# Patient Record
Sex: Female | Born: 1975 | Race: Black or African American | Hispanic: No | Marital: Married | Smoking: Never smoker
Health system: Southern US, Community
[De-identification: ages and names within clinical notes are randomized; demographics above are authoritative.]

## PROBLEM LIST (undated history)

## (undated) DIAGNOSIS — Z01419 Encounter for gynecological examination (general) (routine) without abnormal findings: Secondary | ICD-10-CM

## (undated) DIAGNOSIS — E559 Vitamin D deficiency, unspecified: Secondary | ICD-10-CM

## (undated) DIAGNOSIS — T782XXA Anaphylactic shock, unspecified, initial encounter: Secondary | ICD-10-CM

## (undated) DIAGNOSIS — Z803 Family history of malignant neoplasm of breast: Secondary | ICD-10-CM

## (undated) DIAGNOSIS — Z800 Family history of malignant neoplasm of digestive organs: Secondary | ICD-10-CM

## (undated) DIAGNOSIS — Z975 Presence of (intrauterine) contraceptive device: Secondary | ICD-10-CM

## (undated) DIAGNOSIS — J302 Other seasonal allergic rhinitis: Secondary | ICD-10-CM

## (undated) DIAGNOSIS — I10 Essential (primary) hypertension: Secondary | ICD-10-CM

## (undated) DIAGNOSIS — K5900 Constipation, unspecified: Secondary | ICD-10-CM

## (undated) DIAGNOSIS — Z9289 Personal history of other medical treatment: Secondary | ICD-10-CM

## (undated) DIAGNOSIS — G43909 Migraine, unspecified, not intractable, without status migrainosus: Secondary | ICD-10-CM

## (undated) DIAGNOSIS — Z6711 Type A blood, Rh negative: Secondary | ICD-10-CM

## (undated) HISTORY — DX: Family history of malignant neoplasm of breast: Z80.3

## (undated) HISTORY — DX: Type A blood, Rh negative: Z67.11

## (undated) HISTORY — DX: Vitamin D deficiency, unspecified: E55.9

## (undated) HISTORY — DX: Essential (primary) hypertension: I10

## (undated) HISTORY — DX: Migraine, unspecified, not intractable, without status migrainosus: G43.909

## (undated) HISTORY — DX: Personal history of other medical treatment: Z92.89

## (undated) HISTORY — DX: Constipation, unspecified: K59.00

## (undated) HISTORY — DX: Family history of malignant neoplasm of digestive organs: Z80.0

## (undated) HISTORY — DX: Other seasonal allergic rhinitis: J30.2

## (undated) HISTORY — DX: Anaphylactic shock, unspecified, initial encounter: T78.2XXA

## (undated) HISTORY — DX: Presence of (intrauterine) contraceptive device: Z97.5

## (undated) HISTORY — DX: Encounter for gynecological examination (general) (routine) without abnormal findings: Z01.419

## (undated) HISTORY — PX: TOE SURGERY: SHX1073

---

## 1998-04-14 ENCOUNTER — Encounter: Admission: RE | Admit: 1998-04-14 | Discharge: 1998-04-14 | Payer: Self-pay | Admitting: Family Medicine

## 1998-05-08 ENCOUNTER — Encounter: Admission: RE | Admit: 1998-05-08 | Discharge: 1998-05-08 | Payer: Self-pay | Admitting: Family Medicine

## 1998-05-21 ENCOUNTER — Encounter: Admission: RE | Admit: 1998-05-21 | Discharge: 1998-05-21 | Payer: Self-pay | Admitting: Family Medicine

## 1998-08-25 ENCOUNTER — Encounter: Admission: RE | Admit: 1998-08-25 | Discharge: 1998-08-25 | Payer: Self-pay | Admitting: Family Medicine

## 1998-08-27 ENCOUNTER — Encounter: Admission: RE | Admit: 1998-08-27 | Discharge: 1998-08-27 | Payer: Self-pay | Admitting: Family Medicine

## 1998-09-08 ENCOUNTER — Encounter: Admission: RE | Admit: 1998-09-08 | Discharge: 1998-09-08 | Payer: Self-pay | Admitting: Family Medicine

## 1998-09-15 ENCOUNTER — Encounter: Admission: RE | Admit: 1998-09-15 | Discharge: 1998-09-15 | Payer: Self-pay | Admitting: Family Medicine

## 1998-10-16 ENCOUNTER — Encounter: Admission: RE | Admit: 1998-10-16 | Discharge: 1998-10-16 | Payer: Self-pay | Admitting: Family Medicine

## 1998-11-17 ENCOUNTER — Encounter: Admission: RE | Admit: 1998-11-17 | Discharge: 1998-11-17 | Payer: Self-pay | Admitting: Family Medicine

## 1998-12-12 ENCOUNTER — Encounter: Admission: RE | Admit: 1998-12-12 | Discharge: 1998-12-12 | Payer: Self-pay | Admitting: Sports Medicine

## 1999-01-14 ENCOUNTER — Encounter: Admission: RE | Admit: 1999-01-14 | Discharge: 1999-01-14 | Payer: Self-pay | Admitting: Family Medicine

## 1999-01-28 ENCOUNTER — Encounter: Admission: RE | Admit: 1999-01-28 | Discharge: 1999-01-28 | Payer: Self-pay | Admitting: Family Medicine

## 1999-06-29 ENCOUNTER — Encounter: Admission: RE | Admit: 1999-06-29 | Discharge: 1999-06-29 | Payer: Self-pay | Admitting: Family Medicine

## 1999-07-06 ENCOUNTER — Encounter: Admission: RE | Admit: 1999-07-06 | Discharge: 1999-10-04 | Payer: Self-pay | Admitting: *Deleted

## 1999-11-10 ENCOUNTER — Encounter: Payer: Self-pay | Admitting: Emergency Medicine

## 1999-11-10 ENCOUNTER — Emergency Department (HOSPITAL_COMMUNITY): Admission: EM | Admit: 1999-11-10 | Discharge: 1999-11-10 | Payer: Self-pay | Admitting: Emergency Medicine

## 1999-11-13 ENCOUNTER — Ambulatory Visit (HOSPITAL_COMMUNITY): Admission: RE | Admit: 1999-11-13 | Discharge: 1999-11-13 | Payer: Self-pay | Admitting: Family Medicine

## 1999-11-13 ENCOUNTER — Encounter: Admission: RE | Admit: 1999-11-13 | Discharge: 1999-11-13 | Payer: Self-pay | Admitting: Family Medicine

## 2000-01-20 ENCOUNTER — Encounter: Admission: RE | Admit: 2000-01-20 | Discharge: 2000-01-20 | Payer: Self-pay | Admitting: Family Medicine

## 2000-05-09 ENCOUNTER — Encounter: Admission: RE | Admit: 2000-05-09 | Discharge: 2000-05-09 | Payer: Self-pay | Admitting: Family Medicine

## 2000-07-26 ENCOUNTER — Encounter: Admission: RE | Admit: 2000-07-26 | Discharge: 2000-07-26 | Payer: Self-pay | Admitting: Family Medicine

## 2000-12-29 ENCOUNTER — Ambulatory Visit (HOSPITAL_COMMUNITY): Admission: RE | Admit: 2000-12-29 | Discharge: 2000-12-29 | Payer: Self-pay | Admitting: Orthopedic Surgery

## 2000-12-30 ENCOUNTER — Encounter: Admission: RE | Admit: 2000-12-30 | Discharge: 2000-12-30 | Payer: Self-pay | Admitting: Family Medicine

## 2001-01-04 ENCOUNTER — Encounter: Payer: Self-pay | Admitting: Emergency Medicine

## 2001-01-04 ENCOUNTER — Emergency Department (HOSPITAL_COMMUNITY): Admission: EM | Admit: 2001-01-04 | Discharge: 2001-01-04 | Payer: Self-pay | Admitting: Emergency Medicine

## 2001-01-05 ENCOUNTER — Encounter: Payer: Self-pay | Admitting: Emergency Medicine

## 2001-01-09 ENCOUNTER — Encounter: Payer: Self-pay | Admitting: Sports Medicine

## 2001-01-09 ENCOUNTER — Encounter: Admission: RE | Admit: 2001-01-09 | Discharge: 2001-01-09 | Payer: Self-pay | Admitting: Family Medicine

## 2001-01-09 ENCOUNTER — Encounter: Admission: RE | Admit: 2001-01-09 | Discharge: 2001-01-09 | Payer: Self-pay | Admitting: Sports Medicine

## 2001-01-13 ENCOUNTER — Encounter: Admission: RE | Admit: 2001-01-13 | Discharge: 2001-01-13 | Payer: Self-pay | Admitting: Family Medicine

## 2001-01-16 ENCOUNTER — Encounter: Admission: RE | Admit: 2001-01-16 | Discharge: 2001-03-13 | Payer: Self-pay | Admitting: Sports Medicine

## 2001-01-20 ENCOUNTER — Encounter: Admission: RE | Admit: 2001-01-20 | Discharge: 2001-01-20 | Payer: Self-pay | Admitting: Family Medicine

## 2001-02-06 ENCOUNTER — Encounter: Admission: RE | Admit: 2001-02-06 | Discharge: 2001-02-06 | Payer: Self-pay | Admitting: Family Medicine

## 2001-04-21 ENCOUNTER — Encounter: Admission: RE | Admit: 2001-04-21 | Discharge: 2001-04-21 | Payer: Self-pay | Admitting: Family Medicine

## 2001-06-16 ENCOUNTER — Encounter: Admission: RE | Admit: 2001-06-16 | Discharge: 2001-06-16 | Payer: Self-pay | Admitting: Family Medicine

## 2001-07-19 ENCOUNTER — Encounter: Admission: RE | Admit: 2001-07-19 | Discharge: 2001-07-19 | Payer: Self-pay | Admitting: Family Medicine

## 2001-08-31 ENCOUNTER — Encounter: Payer: Self-pay | Admitting: *Deleted

## 2001-08-31 ENCOUNTER — Encounter: Admission: RE | Admit: 2001-08-31 | Discharge: 2001-08-31 | Payer: Self-pay | Admitting: Family Medicine

## 2001-08-31 ENCOUNTER — Encounter: Admission: RE | Admit: 2001-08-31 | Discharge: 2001-08-31 | Payer: Self-pay | Admitting: *Deleted

## 2001-10-03 ENCOUNTER — Ambulatory Visit (HOSPITAL_COMMUNITY): Admission: RE | Admit: 2001-10-03 | Discharge: 2001-10-03 | Payer: Self-pay | Admitting: Obstetrics and Gynecology

## 2001-10-03 ENCOUNTER — Encounter: Payer: Self-pay | Admitting: Obstetrics and Gynecology

## 2001-10-05 ENCOUNTER — Inpatient Hospital Stay (HOSPITAL_COMMUNITY): Admission: AD | Admit: 2001-10-05 | Discharge: 2001-10-05 | Payer: Self-pay | Admitting: Obstetrics and Gynecology

## 2002-03-01 ENCOUNTER — Inpatient Hospital Stay (HOSPITAL_COMMUNITY): Admission: AD | Admit: 2002-03-01 | Discharge: 2002-03-01 | Payer: Self-pay | Admitting: Obstetrics and Gynecology

## 2002-03-03 ENCOUNTER — Inpatient Hospital Stay (HOSPITAL_COMMUNITY): Admission: AD | Admit: 2002-03-03 | Discharge: 2002-03-03 | Payer: Self-pay | Admitting: Obstetrics and Gynecology

## 2002-04-15 ENCOUNTER — Inpatient Hospital Stay (HOSPITAL_COMMUNITY): Admission: AD | Admit: 2002-04-15 | Discharge: 2002-04-15 | Payer: Self-pay | Admitting: Obstetrics and Gynecology

## 2002-05-15 ENCOUNTER — Inpatient Hospital Stay (HOSPITAL_COMMUNITY): Admission: AD | Admit: 2002-05-15 | Discharge: 2002-05-15 | Payer: Self-pay | Admitting: Obstetrics and Gynecology

## 2002-05-20 ENCOUNTER — Inpatient Hospital Stay (HOSPITAL_COMMUNITY): Admission: AD | Admit: 2002-05-20 | Discharge: 2002-05-20 | Payer: Self-pay | Admitting: Obstetrics and Gynecology

## 2002-05-24 ENCOUNTER — Inpatient Hospital Stay (HOSPITAL_COMMUNITY): Admission: AD | Admit: 2002-05-24 | Discharge: 2002-05-26 | Payer: Self-pay | Admitting: Obstetrics and Gynecology

## 2002-12-27 ENCOUNTER — Other Ambulatory Visit: Admission: RE | Admit: 2002-12-27 | Discharge: 2002-12-27 | Payer: Self-pay | Admitting: *Deleted

## 2002-12-27 ENCOUNTER — Other Ambulatory Visit: Admission: RE | Admit: 2002-12-27 | Discharge: 2002-12-27 | Payer: Self-pay | Admitting: Obstetrics and Gynecology

## 2004-09-01 ENCOUNTER — Emergency Department (HOSPITAL_COMMUNITY): Admission: EM | Admit: 2004-09-01 | Discharge: 2004-09-02 | Payer: Self-pay | Admitting: Emergency Medicine

## 2005-07-07 ENCOUNTER — Other Ambulatory Visit: Admission: RE | Admit: 2005-07-07 | Discharge: 2005-07-07 | Payer: Self-pay | Admitting: Obstetrics and Gynecology

## 2006-02-06 ENCOUNTER — Emergency Department (HOSPITAL_COMMUNITY): Admission: EM | Admit: 2006-02-06 | Discharge: 2006-02-06 | Payer: Self-pay | Admitting: Emergency Medicine

## 2007-09-30 ENCOUNTER — Emergency Department (HOSPITAL_COMMUNITY): Admission: EM | Admit: 2007-09-30 | Discharge: 2007-09-30 | Payer: Self-pay | Admitting: Emergency Medicine

## 2007-10-03 ENCOUNTER — Other Ambulatory Visit: Admission: RE | Admit: 2007-10-03 | Discharge: 2007-10-03 | Payer: Self-pay | Admitting: *Deleted

## 2010-11-05 DIAGNOSIS — Z975 Presence of (intrauterine) contraceptive device: Secondary | ICD-10-CM

## 2010-11-05 HISTORY — DX: Presence of (intrauterine) contraceptive device: Z97.5

## 2011-01-28 ENCOUNTER — Other Ambulatory Visit: Payer: Self-pay | Admitting: Family Medicine

## 2011-01-28 DIAGNOSIS — N6459 Other signs and symptoms in breast: Secondary | ICD-10-CM

## 2011-02-03 ENCOUNTER — Other Ambulatory Visit: Payer: Self-pay | Admitting: Family Medicine

## 2011-02-03 ENCOUNTER — Ambulatory Visit
Admission: RE | Admit: 2011-02-03 | Discharge: 2011-02-03 | Disposition: A | Discharge status: Home / Self Care | Payer: BC Managed Care – PPO | Source: Ambulatory Visit | Attending: Family Medicine | Admitting: Family Medicine

## 2011-02-03 DIAGNOSIS — N6459 Other signs and symptoms in breast: Secondary | ICD-10-CM

## 2011-04-23 NOTE — H&P (Signed)
[facility]  Patient:    Christine Gardner, Christine Gardner Visit Number: [id number] MRN: [id number]          Service Type: OBS Location: 910B [id number] [id number] Attending Physician:  Esmeralda Arthur Dictated by:   Silverio Lay, M.D. Admit Date:  05/24/2002                           History and Physical  REASON FOR ADMISSION:         Intrauterine pregnancy at 39 weeks and one day in active labor.  HISTORY OF PRESENT ILLNESS:   This is a 35 year old married African American woman, gravida 4, para 2, with an estimated delivery date of May 30, 2002, being admitted at 39 weeks and one day for regular uterine contractions since 10 a.m. this morning.  Upon her arrival at maternity admission, she was 6-7 cm, 90% effaced, bulging bag of water.  She reports good fetal activity. Denies any pregnancy-induced hypertension symptoms.  Denies any pregnancy-induced hypertension symptoms.  Denies any leaking of water and denies any bleeding.  Her last visit at [facility] was May 23, 2002, where membranes were stripped.  Prenatal course is remarkable for blood type A-negative, sickle cell negative, RPR nonreactive, rubella immune, HBsAg negative.  HIV nonreactive.  Pap smear within normal limits.  Gonorrhea negative.  Chlamydia negative.  A 28 week glucose tolerance test normal.  A 28 week RhoGAM received.  A 35 week group B strep positive.  A 20 week ultrasound with normal anatomy survey.  She also demonstrates a variable elevation of her hypertension.  At no time, evidence of pregnancy-induced hypertension.  ALLERGIES:                    VICODIN.  PAST MEDICAL HISTORY:         In 1990, early termination of pregnancy with no problem.  In June of 1995, spontaneous vaginal delivery of a female infant at 40 weeks plus weighing 7 pounds.  In April 1998, spontaneous vaginal delivery of a female infant at 40 weeks weighing 7 pounds and 1 ounce.  FAMILY HISTORY:               Maternal  mother and maternal grandmother with hypertension.  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  PHYSICAL EXAMINATION:  VITAL SIGNS:                  Normal.  HEENT:                        Negative.  LUNGS:                        Clear.  HEART:                        Normal.  ABDOMEN:                      Gravid nontender, size adequate for dates.  VAGINAL EXAMINATION:          Nine centimeters, completely effaced, bulging bag of membranes, vertex -1.  EXTREMITIES:                  Negative.  LABORATORY DATA:  Fetal heart rate tracings are reactive.  ASSESSMENT:                   Intrauterine pregnancy at 39 weeks and one day                               in active labor.  PLAN:                         The patient is admitted to labor and delivery with routine MD orders.  We will proceed with artificial rupture of membranes as soon as possible, and spontaneous vaginal delivery is expected. Dictated by:   Silverio Lay, M.D. Attending Physician:  Esmeralda Arthur DD:  05/24/02 TD:  05/24/02 Job: [id number]

## 2011-04-23 NOTE — H&P (Signed)
[facility]  Patient:    Christine Gardner, Christine Gardner Visit Number: [id number] MRN: [id number]          Service Type: OBS Location: 910A 9101 01 Attending Physician:  Esmeralda Arthur Dictated by:   Nigel Bridgeman, C.N.M. Admit Date:  05/24/2002                           History and Physical  No dictation. Dictated by:   Nigel Bridgeman, C.N.M. Attending Physician:  Esmeralda Arthur DD:  05/24/02 TD:  05/24/02 Job: [id number]

## 2011-09-15 LAB — URINALYSIS, ROUTINE W REFLEX MICROSCOPIC
Bilirubin Urine: NEGATIVE
Glucose, UA: NEGATIVE
Ketones, ur: NEGATIVE
Protein, ur: 100 — AB

## 2011-09-15 LAB — PREGNANCY, URINE: Preg Test, Ur: NEGATIVE

## 2011-09-15 LAB — URINE MICROSCOPIC-ADD ON

## 2011-11-11 ENCOUNTER — Other Ambulatory Visit: Payer: Self-pay | Admitting: Orthopedic Surgery

## 2011-11-11 DIAGNOSIS — R0789 Other chest pain: Secondary | ICD-10-CM

## 2011-11-13 ENCOUNTER — Ambulatory Visit
Admission: RE | Admit: 2011-11-13 | Discharge: 2011-11-13 | Disposition: A | Discharge status: Home / Self Care | Payer: BC Managed Care – PPO | Source: Ambulatory Visit | Attending: Orthopedic Surgery | Admitting: Orthopedic Surgery

## 2011-11-13 DIAGNOSIS — R0789 Other chest pain: Secondary | ICD-10-CM

## 2011-11-13 MED ORDER — GADOBENATE DIMEGLUMINE 529 MG/ML IV SOLN
14.0000 mL | Freq: Once | INTRAVENOUS | Status: AC | PRN
  Administered 2011-11-13: 14 mL via INTRAVENOUS

## 2011-12-07 HISTORY — PX: COLPOSCOPY: SHX161

## 2012-03-22 ENCOUNTER — Other Ambulatory Visit (HOSPITAL_COMMUNITY)
Admission: RE | Admit: 2012-03-22 | Discharge: 2012-03-22 | Disposition: A | Discharge status: Home / Self Care | Payer: BC Managed Care – PPO | Source: Ambulatory Visit | Attending: Internal Medicine | Admitting: Internal Medicine

## 2012-03-22 ENCOUNTER — Other Ambulatory Visit: Payer: Self-pay | Admitting: Family Medicine

## 2012-03-22 DIAGNOSIS — Z113 Encounter for screening for infections with a predominantly sexual mode of transmission: Secondary | ICD-10-CM | POA: Insufficient documentation

## 2012-03-22 DIAGNOSIS — R87810 Cervical high risk human papillomavirus (HPV) DNA test positive: Secondary | ICD-10-CM | POA: Insufficient documentation

## 2012-03-22 DIAGNOSIS — Z01419 Encounter for gynecological examination (general) (routine) without abnormal findings: Secondary | ICD-10-CM | POA: Insufficient documentation

## 2012-05-03 ENCOUNTER — Encounter: Payer: Self-pay | Admitting: Obstetrics and Gynecology

## 2012-05-04 ENCOUNTER — Encounter: Payer: Self-pay | Admitting: Obstetrics and Gynecology

## 2012-08-18 ENCOUNTER — Encounter: Payer: Self-pay | Admitting: Obstetrics and Gynecology

## 2012-08-29 ENCOUNTER — Encounter: Payer: BC Managed Care – PPO | Admitting: Obstetrics and Gynecology

## 2012-09-06 ENCOUNTER — Encounter: Payer: Self-pay | Admitting: Obstetrics and Gynecology

## 2012-09-19 ENCOUNTER — Ambulatory Visit: Payer: BC Managed Care – PPO

## 2012-09-19 ENCOUNTER — Encounter: Payer: BC Managed Care – PPO | Admitting: Obstetrics and Gynecology

## 2012-09-26 ENCOUNTER — Ambulatory Visit: Payer: BC Managed Care – PPO

## 2012-09-26 ENCOUNTER — Ambulatory Visit (INDEPENDENT_AMBULATORY_CARE_PROVIDER_SITE_OTHER): Payer: BC Managed Care – PPO | Admitting: Obstetrics and Gynecology

## 2012-09-26 ENCOUNTER — Encounter: Payer: Self-pay | Admitting: Obstetrics and Gynecology

## 2012-09-26 VITALS — BP 130/88 | Wt 169.0 lb

## 2012-09-26 DIAGNOSIS — B977 Papillomavirus as the cause of diseases classified elsewhere: Secondary | ICD-10-CM

## 2012-09-26 DIAGNOSIS — IMO0002 Reserved for concepts with insufficient information to code with codable children: Secondary | ICD-10-CM | POA: Insufficient documentation

## 2012-09-26 DIAGNOSIS — R6889 Other general symptoms and signs: Secondary | ICD-10-CM

## 2012-09-26 LAB — POCT URINE PREGNANCY: Preg Test, Ur: NEGATIVE

## 2012-09-26 NOTE — Patient Instructions (Signed)
Colposcopy Care After Colposcopy is a procedure in which a special tool is used to magnify the surface of the cervix. A tissue sample (biopsy) may also be taken. This sample will be looked at for cervical cancer or other problems. After the test:  You may have some cramping.  Lie down for a few minutes if you feel lightheaded.   You may have some bleeding which should stop in a few days. HOME CARE  Do not have sex or use tampons for 2 to 3 days or as told.  Only take medicine as told by your doctor.  Continue to take your birth control pills as usual. Finding out the results of your test Ask when your test results will be ready. Make sure you get your test results. GET HELP RIGHT AWAY IF:  You are bleeding a lot or are passing blood clots.  You develop a fever of 102 F (38.9 C) or higher.  You have abnormal vaginal discharge.  You have cramps that do not go away with medicine.  You feel lightheaded, dizzy, or pass out (faint). MAKE SURE YOU:   Understand these instructions.  Will watch your condition.  Will get help right away if you are not doing well or get worse. Document Released: 05/10/2008 Document Revised: 02/14/2012 Document Reviewed: 05/10/2008 ExitCare Patient Information 2013 [employer]. HPV Test The HPV (human papillomavirus) test is used to screen for high-risk types with HPV infection. HPV is a group of about 100 related viruses, of which 40 types are genital viruses. Most HPV viruses cause infections that usually resolve without treatment within 2 years. Some HPV infections can cause skin and genital warts (condylomata). HPV types 16, 18, 31 and 45 are considered high-risk types of HPV. High-risk types of HPV do not usually cause visible warts, but if untreated, may lead to cancers of the outlet of the womb (cervix) or anus. An HPV test identifies the DNA (genetic) strands of the HPV infection. Because the test identifies the DNA strands, the test is also  referred to as the HPV DNA test. Although HPV is found in both males and females, the HPV test is only used to screen for cervical cancer in females. This test is recommended for females:  With an abnormal Pap test.  After treatment of an abnormal Pap test.  Aged 30 and older.  After treatment of a high-risk HPV infection. The HPV test may be done at the same time as a Pap test in females over the age of 30. Both the HPV and Pap test require a sample of cells from the cervix. PREPARATION FOR TEST  You may be asked to avoid douching, tampons, or vaginal medicines for 48 hours before the HPV test. You will be asked to urinate before the test. For the HPV test, you will need to lie on an exam table with your feet in stirrups. A spatula will be inserted into the vagina. The spatula will be used to swab the cervix for a cell and mucus sample. The sample will be further evaluated in a lab under a microscope. NORMAL FINDINGS  Normal: High-risk HPV is not found.  Ranges for normal findings may vary among different laboratories and hospitals. You should always check with your doctor after having lab work or other tests done to discuss the meaning of your test results and whether your values are considered within normal limits. MEANING OF TEST An abnormal HPV test means that high-risk HPV is found. Your caregiver may   recommend further testing. Your caregiver will go over the test results with you. He or she will and discuss the importance and meaning of your results, as well as treatment options and the need for additional tests, if necessary. OBTAINING THE RESULTS  It is your responsibility to obtain your test results. Ask the lab or department performing the test when and how you will get your results. Document Released: 12/17/2004 Document Revised: 02/14/2012 Document Reviewed: 09/01/2005 ExitCare Patient Information 2013 [employer].  

## 2012-09-26 NOTE — Progress Notes (Signed)
Previous Pap Smear: 03/22/12  LSIL CIN-1/HPV Previous Colposcopy: n/a Referred From: [facility] LMP: 09/15/12 Contraception: Mirena G,P: 4, 3 HRHPV  Also positive colpo done Adequate bx at 1 oclock where AW changes are seen.  ECC done Physical Examination: Pelvic - normal external genitalia, vulva, vagina, , uterus and adnexa Will call pt with results

## 2012-09-28 LAB — PATHOLOGY

## 2012-09-29 ENCOUNTER — Telehealth: Payer: Self-pay

## 2012-09-29 NOTE — Telephone Encounter (Signed)
Message copied by Rolla Plate on Fri Sep 29, 2012 10:05 AM ------      Message from: Jaymes Graff      Created: Fri Sep 29, 2012  1:19 AM       Please review colpo results with patient and tell her I recommend a pap every six months for the next year.

## 2012-09-29 NOTE — Telephone Encounter (Signed)
Spoke with pt informed info below pt voice understanding

## 2013-05-29 ENCOUNTER — Encounter: Payer: Self-pay | Admitting: Medical

## 2013-05-29 ENCOUNTER — Ambulatory Visit (INDEPENDENT_AMBULATORY_CARE_PROVIDER_SITE_OTHER): Payer: BC Managed Care – PPO | Admitting: Medical

## 2013-05-29 VITALS — BP 100/80 | HR 68 | Temp 98.3°F | Resp 16 | Ht 68.0 in | Wt 174.0 lb

## 2013-05-29 DIAGNOSIS — R5383 Other fatigue: Secondary | ICD-10-CM

## 2013-05-29 DIAGNOSIS — R5381 Other malaise: Secondary | ICD-10-CM

## 2013-05-29 DIAGNOSIS — K029 Dental caries, unspecified: Secondary | ICD-10-CM

## 2013-05-29 DIAGNOSIS — Z23 Encounter for immunization: Secondary | ICD-10-CM

## 2013-05-29 DIAGNOSIS — J392 Other diseases of pharynx: Secondary | ICD-10-CM

## 2013-05-29 DIAGNOSIS — M25519 Pain in unspecified shoulder: Secondary | ICD-10-CM

## 2013-05-29 DIAGNOSIS — M25511 Pain in right shoulder: Secondary | ICD-10-CM

## 2013-05-29 DIAGNOSIS — Z0000 Encounter for general adult medical examination without abnormal findings: Secondary | ICD-10-CM

## 2013-05-29 LAB — LIPID PANEL
Cholesterol: 155 mg/dL (ref 0–200)
VLDL: 12 mg/dL (ref 0–40)

## 2013-05-29 LAB — COMPREHENSIVE METABOLIC PANEL
ALT: 10 U/L (ref 0–35)
AST: 14 U/L (ref 0–37)
Albumin: 4.2 g/dL (ref 3.5–5.2)
Alkaline Phosphatase: 65 U/L (ref 39–117)
Glucose, Bld: 92 mg/dL (ref 70–99)
Potassium: 4.1 mEq/L (ref 3.5–5.3)
Sodium: 135 mEq/L (ref 135–145)
Total Protein: 7.7 g/dL (ref 6.0–8.3)

## 2013-05-29 LAB — CBC WITH DIFFERENTIAL/PLATELET
Basophils Relative: 0 % (ref 0–1)
Eosinophils Absolute: 0.2 10*3/uL (ref 0.0–0.7)
Eosinophils Relative: 2 % (ref 0–5)
MCH: 32 pg (ref 26.0–34.0)
MCHC: 33.7 g/dL (ref 30.0–36.0)
MCV: 95.1 fL (ref 78.0–100.0)
Neutrophils Relative %: 69 % (ref 43–77)
Platelets: 270 10*3/uL (ref 150–400)
RDW: 13.1 % (ref 11.5–15.5)

## 2013-05-29 LAB — POCT URINALYSIS DIPSTICK
Bilirubin, UA: NEGATIVE
Ketones, UA: NEGATIVE
Leukocytes, UA: NEGATIVE
Nitrite, UA: NEGATIVE
Protein, UA: NEGATIVE
pH, UA: 7

## 2013-05-29 LAB — TSH: TSH: 1.265 u[IU]/mL (ref 0.350–4.500)
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
## 2013-06-05 DIAGNOSIS — T782XXA Anaphylactic shock, unspecified, initial encounter: Secondary | ICD-10-CM

## 2013-06-05 HISTORY — DX: Anaphylactic shock, unspecified, initial encounter: T78.2XXA

## 2013-06-13 ENCOUNTER — Emergency Department (HOSPITAL_COMMUNITY)
Admission: EM | Admit: 2013-06-13 | Discharge: 2013-06-13 | Disposition: A | Discharge status: Home / Self Care | Payer: BC Managed Care – PPO | Attending: Emergency Medicine | Admitting: Emergency Medicine

## 2013-06-13 ENCOUNTER — Telehealth: Payer: Self-pay | Admitting: Internal Medicine

## 2013-06-13 ENCOUNTER — Encounter (HOSPITAL_COMMUNITY): Payer: Self-pay | Admitting: *Deleted

## 2013-06-13 DIAGNOSIS — R000 Tachycardia, unspecified: Secondary | ICD-10-CM | POA: Insufficient documentation

## 2013-06-13 DIAGNOSIS — Z8679 Personal history of other diseases of the circulatory system: Secondary | ICD-10-CM | POA: Insufficient documentation

## 2013-06-13 DIAGNOSIS — L509 Urticaria, unspecified: Secondary | ICD-10-CM | POA: Diagnosis present

## 2013-06-13 DIAGNOSIS — Z8639 Personal history of other endocrine, nutritional and metabolic disease: Secondary | ICD-10-CM | POA: Insufficient documentation

## 2013-06-13 DIAGNOSIS — Z8709 Personal history of other diseases of the respiratory system: Secondary | ICD-10-CM | POA: Diagnosis not present

## 2013-06-13 DIAGNOSIS — Z862 Personal history of diseases of the blood and blood-forming organs and certain disorders involving the immune mechanism: Secondary | ICD-10-CM | POA: Insufficient documentation

## 2013-06-13 MED ORDER — DIPHENHYDRAMINE HCL 25 MG PO CAPS
50.0000 mg | ORAL_CAPSULE | Freq: Once | ORAL | Status: AC
  Administered 2013-06-13: 50 mg via ORAL
  Filled 2013-06-13: qty 2

## 2013-06-13 MED ORDER — FAMOTIDINE 20 MG PO TABS
20.0000 mg | ORAL_TABLET | Freq: Once | ORAL | Status: AC
Start: 2013-06-13 — End: 2013-06-13
  Administered 2013-06-13: 20 mg via ORAL
  Filled 2013-06-13: qty 1

## 2013-06-13 MED ORDER — PREDNISONE 20 MG PO TABS
60.0000 mg | ORAL_TABLET | Freq: Once | ORAL | Status: AC
  Administered 2013-06-13: 60 mg via ORAL
  Filled 2013-06-13: qty 3

## 2013-06-13 NOTE — Telephone Encounter (Signed)
Was this patient offered appt for Wednesday?  We had spots to work her in.  Being the only provider here, I wasn't able to get to this message til late in the day, and she apparently ended going to ED.

## 2013-06-13 NOTE — ED Provider Notes (Signed)
3:57 PM MSE exam: pt with diffuse worsening urticaria beginning last night. Pt took benadryl with some relief. Denies new exposures, medications, foods. No SOB, trouble breathing, mouth or lip swelling, lightheadedness/syncope, vomiting/diarrhea.   Exam: Skin: generalized urticara, no airway swelling, no wheezing.   Plan: benadryl, pepcid, prednisone, move to back to monitor for improvement. Pt stable.   Renne Crigler, PA-C 06/13/13 1559

## 2013-06-13 NOTE — Telephone Encounter (Signed)
Pt states that she started breaking out in hives last night at midnight on her legs and it has gone to her chest,face,arms and she was wondering what she can do to help it.?

## 2013-06-13 NOTE — ED Notes (Signed)
Pt has hives to entire body. Reports she woke up at midnight last night itching and noticed the hives beginning on body. pts airway is intact/no airway involvement.

## 2013-06-13 NOTE — ED Provider Notes (Signed)
Medical screening examination/treatment/procedure(s) were performed by non-physician practitioner and as supervising physician I was immediately available for consultation/collaboration.  Sunnie Nielsen, MD 06/13/13 [phone]

## 2013-06-13 NOTE — ED Provider Notes (Signed)
Medical screening examination/treatment/procedure(s) were performed by non-physician practitioner and as supervising physician I was immediately available for consultation/collaboration.  Sunnie Nielsen, MD 06/13/13 [phone]
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
## 2013-06-14 ENCOUNTER — Other Ambulatory Visit: Payer: Self-pay

## 2013-06-14 ENCOUNTER — Encounter: Payer: Self-pay | Admitting: Family Medicine

## 2013-06-14 ENCOUNTER — Ambulatory Visit (INDEPENDENT_AMBULATORY_CARE_PROVIDER_SITE_OTHER): Payer: BC Managed Care – PPO | Admitting: Family Medicine

## 2013-06-14 ENCOUNTER — Encounter (HOSPITAL_COMMUNITY): Payer: Self-pay | Admitting: *Deleted

## 2013-06-14 ENCOUNTER — Inpatient Hospital Stay (HOSPITAL_COMMUNITY)
Admission: EM | Admit: 2013-06-14 | Discharge: 2013-06-18 | DRG: 447 | Disposition: A | Discharge status: Home / Self Care | Payer: BC Managed Care – PPO | Attending: Internal Medicine | Admitting: Internal Medicine

## 2013-06-14 VITALS — BP 110/70 | HR 114 | Temp 98.9°F | Wt 172.0 lb

## 2013-06-14 DIAGNOSIS — E559 Vitamin D deficiency, unspecified: Secondary | ICD-10-CM | POA: Diagnosis present

## 2013-06-14 DIAGNOSIS — D72829 Elevated white blood cell count, unspecified: Secondary | ICD-10-CM | POA: Diagnosis present

## 2013-06-14 DIAGNOSIS — Z79899 Other long term (current) drug therapy: Secondary | ICD-10-CM

## 2013-06-14 DIAGNOSIS — T782XXA Anaphylactic shock, unspecified, initial encounter: Secondary | ICD-10-CM

## 2013-06-14 DIAGNOSIS — L509 Urticaria, unspecified: Secondary | ICD-10-CM

## 2013-06-14 DIAGNOSIS — A499 Bacterial infection, unspecified: Secondary | ICD-10-CM | POA: Diagnosis present

## 2013-06-14 DIAGNOSIS — B9689 Other specified bacterial agents as the cause of diseases classified elsewhere: Secondary | ICD-10-CM | POA: Diagnosis present

## 2013-06-14 DIAGNOSIS — IMO0002 Reserved for concepts with insufficient information to code with codable children: Secondary | ICD-10-CM

## 2013-06-14 DIAGNOSIS — G43909 Migraine, unspecified, not intractable, without status migrainosus: Secondary | ICD-10-CM | POA: Diagnosis present

## 2013-06-14 DIAGNOSIS — Z975 Presence of (intrauterine) contraceptive device: Secondary | ICD-10-CM

## 2013-06-14 DIAGNOSIS — N760 Acute vaginitis: Secondary | ICD-10-CM | POA: Diagnosis present

## 2013-06-14 DIAGNOSIS — T373X5A Adverse effect of other antiprotozoal drugs, initial encounter: Secondary | ICD-10-CM | POA: Diagnosis present

## 2013-06-14 LAB — CBC WITH DIFFERENTIAL/PLATELET
Basophils Absolute: 0 10*3/uL (ref 0.0–0.1)
Basophils Relative: 0 % (ref 0–1)
HCT: 38.2 % (ref 36.0–46.0)
Lymphocytes Relative: 6 % — ABNORMAL LOW (ref 12–46)
MCHC: 34.6 g/dL (ref 30.0–36.0)
Monocytes Absolute: 0.1 10*3/uL (ref 0.1–1.0)
Neutro Abs: 15.2 10*3/uL — ABNORMAL HIGH (ref 1.7–7.7)
Neutrophils Relative %: 93 % — ABNORMAL HIGH (ref 43–77)
RDW: 12.2 % (ref 11.5–15.5)
WBC: 16.3 10*3/uL — ABNORMAL HIGH (ref 4.0–10.5)

## 2013-06-14 MED ORDER — METHYLPREDNISOLONE SODIUM SUCC 125 MG IJ SOLR
125.0000 mg | Freq: Once | INTRAMUSCULAR | Status: AC
  Administered 2013-06-14: 125 mg via INTRAMUSCULAR

## 2013-06-14 MED ORDER — METHYLPREDNISOLONE SODIUM SUCC 125 MG IJ SOLR
INTRAMUSCULAR | Status: AC
  Filled 2013-06-14: qty 2

## 2013-06-14 MED ORDER — PREDNISONE 10 MG PO KIT: PACK | ORAL | Status: DC

## 2013-06-14 MED ORDER — DIPHENHYDRAMINE HCL 50 MG/ML IJ SOLN
INTRAMUSCULAR | Status: AC
  Administered 2013-06-14: 50 mg
  Filled 2013-06-14: qty 1

## 2013-06-14 MED ORDER — EPINEPHRINE 0.3 MG/0.3ML IJ SOAJ
INTRAMUSCULAR | Status: AC
  Administered 2013-06-14: 0.3 mg
  Filled 2013-06-14: qty 0.3

## 2013-06-14 MED ORDER — FAMOTIDINE IN NACL 20-0.9 MG/50ML-% IV SOLN
20.0000 mg | Freq: Once | INTRAVENOUS | Status: AC
  Administered 2013-06-14: 20 mg via INTRAVENOUS
  Filled 2013-06-14: qty 50

## 2013-06-14 NOTE — ED Notes (Signed)
Report to Santina Evans RN in ICU-ready for pt

## 2013-06-14 NOTE — ED Notes (Signed)
Pt states treated yesterday for allergic reaction; worse today; saw pcp and placed on prednisone; states tonight felt worse with new reddened areas  on hands/feet/chest; states tonight developed chest pain and having difficulty breathing; hyperventilating on arrival

## 2013-06-14 NOTE — Telephone Encounter (Signed)
Pt came in today to be since since she was no better

## 2013-06-14 NOTE — Telephone Encounter (Signed)
Yes pt was offered an appt, but she wanted a note sent back to see what she could do over the counter instead first.

## 2013-06-14 NOTE — ED Notes (Signed)
WNU:UVOZ<DG> Expected date:<BR> Expected time:<BR> Means of arrival:<BR> Comments:<BR> Save for pt of dr. Juanetta Snow and itching

## 2013-06-14 NOTE — Progress Notes (Signed)
  Subjective:    Patient ID: Christine Gardner, female    DOB: 14-Jun-1976, 37 y.o.   MRN: [id number]  HPI He is here for evaluation of hives. This started several days ago. She was seen in the emergency room yesterday. At record was reviewed. Apparently they did give her prednisone. She was sent home. She's had no new soaps, detergents, colognes or other exposure. She is on metronidazole and has been on this for several days. She cannot relate taking the pill to the rash getting worse. She has had no shortness of breath, tongue or lip swelling.   Review of Systems     Objective:   Physical Exam Alert and in no distress. Scattered erythematous slightly warm confluent areas are noted on her legs torso and arms.       Assessment & Plan:  Urticaria - Plan: methylPREDNISolone sodium succinate (SOLU-MEDROL) 125 mg/2 mL injection 125 mg, PredniSONE 10 MG KIT  I will get more aggressive with therapy. Recommend she stop taking the metronidazole although I cannot specifically blame this medication. She will let me know how she is doing over the next several days. She is to continue on her other medications.
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
## 2013-06-15 ENCOUNTER — Encounter: Payer: Self-pay | Admitting: Family Medicine

## 2013-06-15 LAB — BASIC METABOLIC PANEL
CO2: 19 mEq/L (ref 19–32)
CO2: 22 mEq/L (ref 19–32)
Calcium: 9 mg/dL (ref 8.4–10.5)
Chloride: 99 mEq/L (ref 96–112)
Chloride: 105 mEq/L (ref 96–112)
GFR calc Af Amer: >90 mL/min (ref >90)
Glucose, Bld: 149 mg/dL — ABNORMAL HIGH (ref 70–99)
Potassium: 3.7 mEq/L (ref 3.5–5.1)
Potassium: 3.9 mEq/L (ref 3.5–5.1)
Sodium: 134 mEq/L — ABNORMAL LOW (ref 135–145)
Sodium: 137 mEq/L (ref 135–145)

## 2013-06-15 LAB — HEPATIC FUNCTION PANEL
AST: 20 U/L (ref 0–37)
Albumin: 4.2 g/dL (ref 3.5–5.2)
Total Bilirubin: 0.4 mg/dL (ref 0.3–1.2)

## 2013-06-15 LAB — CBC
HCT: 32.8 % — ABNORMAL LOW (ref 36.0–46.0)
Hemoglobin: 11.3 g/dL — ABNORMAL LOW (ref 12.0–15.0)
MCH: 32.9 pg (ref 26.0–34.0)
MCV: 95.6 fL (ref 78.0–100.0)
RBC: 3.43 MIL/uL — ABNORMAL LOW (ref 3.87–5.11)

## 2013-06-15 MED ORDER — SODIUM CHLORIDE 0.9 % IV SOLN
INTRAVENOUS | Status: AC
  Administered 2013-06-15: 04:00:00 via INTRAVENOUS
  Administered 2013-06-15: 75 mL/h via INTRAVENOUS
  Administered 2013-06-15: 01:00:00 via INTRAVENOUS

## 2013-06-15 MED ORDER — IBUPROFEN 800 MG PO TABS
800.0000 mg | ORAL_TABLET | Freq: Once | ORAL | Status: AC
  Administered 2013-06-15: 800 mg via ORAL
  Filled 2013-06-15: qty 1

## 2013-06-15 MED ORDER — METHYLPREDNISOLONE SODIUM SUCC 40 MG IJ SOLR
40.0000 mg | Freq: Three times a day (TID) | INTRAMUSCULAR | Status: DC
  Administered 2013-06-15 – 2013-06-18 (×10): 40 mg via INTRAVENOUS
  Filled 2013-06-15 (×16): qty 1

## 2013-06-15 MED ORDER — FAMOTIDINE IN NACL 20-0.9 MG/50ML-% IV SOLN
20.0000 mg | Freq: Once | INTRAVENOUS | Status: AC
  Administered 2013-06-15: 20 mg via INTRAVENOUS
  Filled 2013-06-15: qty 50

## 2013-06-15 MED ORDER — FAMOTIDINE IN NACL 20-0.9 MG/50ML-% IV SOLN
20.0000 mg | Freq: Once | INTRAVENOUS | Status: DC
  Filled 2013-06-15 (×2): qty 50

## 2013-06-15 MED ORDER — FAMOTIDINE IN NACL 20-0.9 MG/50ML-% IV SOLN
20.0000 mg | Freq: Two times a day (BID) | INTRAVENOUS | Status: DC
  Filled 2013-06-15 (×2): qty 50

## 2013-06-15 MED ORDER — FAMOTIDINE IN NACL 20-0.9 MG/50ML-% IV SOLN
20.0000 mg | Freq: Two times a day (BID) | INTRAVENOUS | Status: DC
  Administered 2013-06-15 – 2013-06-17 (×4): 20 mg via INTRAVENOUS
  Filled 2013-06-15 (×6): qty 50

## 2013-06-15 MED ORDER — DIPHENHYDRAMINE HCL 25 MG PO CAPS
25.0000 mg | ORAL_CAPSULE | Freq: Four times a day (QID) | ORAL | Status: DC
  Administered 2013-06-15 (×2): 25 mg via ORAL
  Filled 2013-06-15 (×3): qty 1

## 2013-06-15 MED ORDER — DIPHENHYDRAMINE HCL 50 MG/ML IJ SOLN
25.0000 mg | INTRAMUSCULAR | Status: AC
  Administered 2013-06-15: 25 mg via INTRAVENOUS

## 2013-06-15 MED ORDER — DIPHENHYDRAMINE HCL 50 MG PO CAPS
50.0000 mg | ORAL_CAPSULE | Freq: Four times a day (QID) | ORAL | Status: DC
  Administered 2013-06-15 – 2013-06-18 (×11): 50 mg via ORAL
  Filled 2013-06-15: qty 1
  Filled 2013-06-15: qty 2
  Filled 2013-06-15 (×20): qty 1

## 2013-06-15 MED ORDER — METHYLPREDNISOLONE SODIUM SUCC 40 MG IJ SOLR: 40.0000 mg | Freq: Two times a day (BID) | INTRAMUSCULAR | Status: DC

## 2013-06-15 MED ORDER — SODIUM CHLORIDE 0.9 % IJ SOLN
3.0000 mL | Freq: Two times a day (BID) | INTRAMUSCULAR | Status: DC
  Administered 2013-06-15 – 2013-06-16 (×4): 3 mL via INTRAVENOUS

## 2013-06-15 MED ORDER — DIPHENHYDRAMINE HCL 50 MG/ML IJ SOLN
25.0000 mg | Freq: Four times a day (QID) | INTRAMUSCULAR | Status: DC | PRN
  Administered 2013-06-15 – 2013-06-17 (×7): 25 mg via INTRAVENOUS
  Administered 2013-06-17: 15:00:00 via INTRAVENOUS
  Administered 2013-06-18: 25 mg via INTRAVENOUS
  Filled 2013-06-15 (×11): qty 1

## 2013-06-15 MED ORDER — ONDANSETRON HCL 4 MG PO TABS: 4.0000 mg | ORAL_TABLET | Freq: Four times a day (QID) | ORAL | Status: DC | PRN

## 2013-06-15 MED ORDER — METHYLPREDNISOLONE SODIUM SUCC 40 MG IJ SOLR
40.0000 mg | Freq: Two times a day (BID) | INTRAMUSCULAR | Status: DC
  Filled 2013-06-15 (×3): qty 1

## 2013-06-15 MED ORDER — ALBUTEROL SULFATE (5 MG/ML) 0.5% IN NEBU: 2.5000 mg | INHALATION_SOLUTION | Freq: Four times a day (QID) | RESPIRATORY_TRACT | Status: DC | PRN

## 2013-06-15 MED ORDER — METHYLPREDNISOLONE SODIUM SUCC 40 MG IJ SOLR
40.0000 mg | Freq: Once | INTRAMUSCULAR | Status: AC
  Administered 2013-06-15: 40 mg via INTRAVENOUS

## 2013-06-15 MED ORDER — ONDANSETRON HCL 4 MG/2ML IJ SOLN: 4.0000 mg | Freq: Three times a day (TID) | INTRAMUSCULAR | Status: AC | PRN

## 2013-06-15 MED ORDER — ONDANSETRON HCL 4 MG/2ML IJ SOLN: 4.0000 mg | Freq: Four times a day (QID) | INTRAMUSCULAR | Status: DC | PRN

## 2013-06-15 NOTE — Progress Notes (Signed)
Patient complaining of lips swelling.  Patient is anxious.  O2 sats 100% on room air.  Red, raised rash is pronounced under chin. Patient tearful. The rash continues to be generalized. Notified Dr. Carmell Austria whom came to assess the patient.  A stat dose of benadryl 25 mg IV administered.  Itching, anxiety improved.

## 2013-06-15 NOTE — Progress Notes (Signed)
Utilization review completed.  

## 2013-06-15 NOTE — Progress Notes (Signed)
[facility] PROGRESS NOTE  Christine Gardner ZOX:[id number] DOB: March 26, 1976 DOA: 06/14/2013 PCP: Ernst Breach, PA-C  Assessment/Plan: 1-Anaphylactic reaction with urticaria: this could be secondary to flagyl. Will increase solumedrol to every 8 hours due to episode this morning. Will schedule benadryl, hold benadryl for sedation. Will start nebulizer albuterol for bronchospasm as needed. Continue with Pepcid. Would observe in the step down unit.  2-Leukocytosis: reactive. Trending down. Not on antibiotics. UA negative for infections.    Code Status: Full Code.  Family Communication: Care discussed with patient.  Disposition Plan: Remain in the step down.    Consultants:  None  Procedures:  none  Antibiotics:  none  HPI/Subjective: Events from earlier this morning notice. Patient develop chest tightness, recurrence of rash. She is feeling better after benadryl. Denies chest pain, or dyspnea at this time.   Objective: Filed Vitals:   06/14/13 2350 06/15/13 0000 06/15/13 0338 06/15/13 0600  BP: 139/86  128/68   Pulse: 108  83 81  Temp: 99 F (37.2 C)  98.8 F (37.1 C)   TempSrc: Oral  Oral   Resp: 26 15 21 20   Height: 5\' 7"  (1.702 m)     Weight: 80.8 kg (178 lb 2.1 oz)     SpO2: 100% 100% 100% 90%    Intake/Output Summary (Last 24 hours) at 06/15/13 0757 Last data filed at 06/15/13 0600  Gross per 24 hour  Intake    470 ml  Output    925 ml  Net   -455 ml   Filed Weights   06/14/13 2143 06/14/13 2350  Weight: 78.019 kg (172 lb) 80.8 kg (178 lb 2.1 oz)    Exam:   General:  No distress, alert, speaking in full sentences.   Cardiovascular: S 1, S 2 RRR  Respiratory: CTA, no wheezes.   Abdomen: Bs present, soft, NT  Musculoskeletal: No edema.  Skin: rash bilateral lower extremities.   Data Reviewed: Basic Metabolic Panel:  Recent Labs Lab 06/14/13 2200 06/15/13 0325  NA 134* 137  K 3.7 3.9  CL 99 105  CO2 19 22  GLUCOSE 163* 149*   BUN 10 9  CREATININE 0.70 0.66  CALCIUM 9.9 9.0   Liver Function Tests:  Recent Labs Lab 06/14/13 2200  AST 20  ALT 13  ALKPHOS 72  BILITOT 0.4  PROT 8.3  ALBUMIN 4.2   No results found for this basename: LIPASE, AMYLASE,  in the last 168 hours No results found for this basename: AMMONIA,  in the last 168 hours CBC:  Recent Labs Lab 06/14/13 2200 06/15/13 0325  WBC 16.3* 14.9*  NEUTROABS 15.2*  --   HGB 13.2 11.3*  HCT 38.2 32.8*  MCV 94.6 95.6  PLT 294 242   Cardiac Enzymes: No results found for this basename: CKTOTAL, CKMB, CKMBINDEX, TROPONINI,  in the last 168 hours BNP (last 3 results) No results found for this basename: PROBNP,  in the last 8760 hours CBG: No results found for this basename: GLUCAP,  in the last 168 hours  Recent Results (from the past 240 hour(s))  MRSA PCR SCREENING     Status: None   Collection Time    06/14/13 11:54 PM      Result Value Range Status   MRSA by PCR NEGATIVE  NEGATIVE Final   Comment:            The GeneXpert MRSA Assay (FDA     approved for NASAL specimens     only), is  one component of a     comprehensive MRSA colonization     surveillance program. It is not     intended to diagnose MRSA     infection nor to guide or     monitor treatment for     MRSA infections.     Studies: No results found.  Scheduled Meds: . diphenhydrAMINE  25 mg Oral Q6H  . famotidine (PEPCID) IV  20 mg Intravenous Q12H  . famotidine (PEPCID) IV  20 mg Intravenous Once  . methylPREDNISolone sodium succinate      . methylPREDNISolone (SOLU-MEDROL) injection  40 mg Intravenous Q8H  . sodium chloride  3 mL Intravenous Q12H   Continuous Infusions: . sodium chloride 50 mL/hr at 06/15/13 0417    Principal Problem:   Anaphylaxis    Time spent: 35 minutes.     Christine Gardner  [facility] Pager [phone]. If 7PM-7AM, please contact night-coverage at [email or web address], password [facility] 06/15/2013, 7:57 AM  LOS: 1 day

## 2013-06-16 MED ORDER — SODIUM CHLORIDE 0.9 % IV SOLN
INTRAVENOUS | Status: DC
  Administered 2013-06-16: 04:00:00 via INTRAVENOUS

## 2013-06-16 MED ORDER — CAMPHOR-MENTHOL 0.5-0.5 % EX LOTN
TOPICAL_LOTION | CUTANEOUS | Status: DC | PRN
  Filled 2013-06-16 (×2): qty 222

## 2013-06-16 MED ORDER — HYDROCERIN EX CREA
TOPICAL_CREAM | Freq: Two times a day (BID) | CUTANEOUS | Status: DC
  Administered 2013-06-16: 1 via TOPICAL
  Administered 2013-06-17 – 2013-06-18 (×3): via TOPICAL
  Filled 2013-06-16: qty 113

## 2013-06-16 MED ORDER — ENOXAPARIN SODIUM 40 MG/0.4ML ~~LOC~~ SOLN
40.0000 mg | SUBCUTANEOUS | Status: DC
  Filled 2013-06-16 (×3): qty 0.4

## 2013-06-16 NOTE — Progress Notes (Signed)
[facility] PROGRESS NOTE  Christine Gardner ZOX:[id number] DOB: 1976/10/30 DOA: 06/14/2013 PCP: Ernst Breach, PA-C  Assessment/Plan: 1-Anaphylactic reaction with urticaria: this could be secondary to flagyl. Continue with  solumedrol to every 8 hours. Continue with schedule benadryl, hold benadryl for sedation. nebulizer albuterol for bronchospasm as needed. Continue with Pepcid. -Patient feels over all better. Feels rash is better. Received one extra dose of IV benadryl due to mild swelling of her upper lip.   2-Leukocytosis: reactive. Trending down. Not on antibiotics. UA negative for infections.    Code Status: Full Code.  Family Communication: Care discussed with patient.  Disposition Plan: Transfer to telemetry. Home tomorrow if better.    Consultants:  None  Procedures:  none  Antibiotics:  none  HPI/Subjective: Feeling better. Rash has improved.  Had mils swelling of upper lip.   Objective: Filed Vitals:   06/16/13 0300 06/16/13 0400 06/16/13 0500 06/16/13 0600  BP:    146/85  Pulse: 75 73 62 74  Temp:  99 F (37.2 C)    TempSrc:  Oral    Resp: 21 22 19 23   Height:      Weight:      SpO2: 100% 98% 90% 95%    Intake/Output Summary (Last 24 hours) at 06/16/13 0740 Last data filed at 06/16/13 0600  Gross per 24 hour  Intake   1825 ml  Output   3375 ml  Net  -1550 ml   Filed Weights   06/14/13 2143 06/14/13 2350  Weight: 78.019 kg (172 lb) 80.8 kg (178 lb 2.1 oz)    Exam:   General:  No distress, alert, speaking in full sentences.   Cardiovascular: S 1, S 2 RRR  Respiratory: CTA, no wheezes.   Abdomen: Bs present, soft, NT  Musculoskeletal: No edema.  Skin: rash bilateral lower extremities some what improved.   Data Reviewed: Basic Metabolic Panel:  Recent Labs Lab 06/14/13 2200 06/15/13 0325  NA 134* 137  K 3.7 3.9  CL 99 105  CO2 19 22  GLUCOSE 163* 149*  BUN 10 9  CREATININE 0.70 0.66  CALCIUM 9.9 9.0   Liver  Function Tests:  Recent Labs Lab 06/14/13 2200  AST 20  ALT 13  ALKPHOS 72  BILITOT 0.4  PROT 8.3  ALBUMIN 4.2   No results found for this basename: LIPASE, AMYLASE,  in the last 168 hours No results found for this basename: AMMONIA,  in the last 168 hours CBC:  Recent Labs Lab 06/14/13 2200 06/15/13 0325  WBC 16.3* 14.9*  NEUTROABS 15.2*  --   HGB 13.2 11.3*  HCT 38.2 32.8*  MCV 94.6 95.6  PLT 294 242   Cardiac Enzymes: No results found for this basename: CKTOTAL, CKMB, CKMBINDEX, TROPONINI,  in the last 168 hours BNP (last 3 results) No results found for this basename: PROBNP,  in the last 8760 hours CBG: No results found for this basename: GLUCAP,  in the last 168 hours  Recent Results (from the past 240 hour(s))  MRSA PCR SCREENING     Status: None   Collection Time    06/14/13 11:54 PM      Result Value Range Status   MRSA by PCR NEGATIVE  NEGATIVE Final   Comment:            The GeneXpert MRSA Assay (FDA     approved for NASAL specimens     only), is one component of a     comprehensive MRSA colonization  surveillance program. It is not     intended to diagnose MRSA     infection nor to guide or     monitor treatment for     MRSA infections.     Studies: No results found.  Scheduled Meds: . diphenhydrAMINE  50 mg Oral Q6H  . famotidine (PEPCID) IV  20 mg Intravenous Q12H  . methylPREDNISolone (SOLU-MEDROL) injection  40 mg Intravenous Q8H  . sodium chloride  3 mL Intravenous Q12H   Continuous Infusions: . sodium chloride 10 mL/hr at 06/16/13 0400    Principal Problem:   Anaphylaxis    Time spent: 35 minutes.     Tavoris Brisk  [facility] Pager [phone]. If 7PM-7AM, please contact night-coverage at [email or web address], password [facility] 06/16/2013, 7:40 AM  LOS: 2 days

## 2013-06-16 NOTE — Progress Notes (Signed)
Report called to Jakema, RN on 5E; pt to be transferred to 1509. 

## 2013-06-17 MED ORDER — FAMOTIDINE 20 MG PO TABS
20.0000 mg | ORAL_TABLET | Freq: Two times a day (BID) | ORAL | Status: DC
  Administered 2013-06-17 – 2013-06-18 (×3): 20 mg via ORAL
  Filled 2013-06-17 (×4): qty 1
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
## 2013-06-17 NOTE — Progress Notes (Signed)
[facility] PROGRESS NOTE  Christine Gardner ZOX:[id number] DOB: [birth year] DOA: 06/14/2013 PCP: Ernst Breach, PA-C  Assessment/Plan: 1-Anaphylactic reaction with urticaria: this could be secondary to flagyl. Continue with  solumedrol to every 8 hours. Continue with schedule benadryl, hold benadryl for sedation. nebulizer albuterol for bronchospasm as needed. Continue with Pepcid.  -Rash improving, now complaining of rash pain on right feet/  -Will check ESR and ANA.  -She will need to follow up with dermatologist.   2-Leukocytosis: reactive. Trending down. Not on antibiotics. UA negative for infections.  3-Right foot pain, rash anterior aspect: will monitor clinical if worsening will consider antibiotics.   Code Status: Full Code.  Family Communication: Care discussed with patient.  Disposition Plan: Transfer to telemetry. Home tomorrow if better.    Consultants:  None  Procedures:  none  Antibiotics:  none  HPI/Subjective: Rash improved. Had some swelling of lip yesterday night.  Notice rash right foot, and pain plantar aspect on ambulation.   Objective: Filed Vitals:   06/16/13 0918 06/16/13 1556 06/16/13 2100 06/17/13 0500  BP: 142/85 142/84 138/81 119/75  Pulse: 75 79 63 66  Temp: 98.2 F (36.8 C) 98.8 F (37.1 C) 98.8 F (37.1 C) 98.4 F (36.9 C)  TempSrc: Oral Oral Oral Oral  Resp: 18 18 18 18   Height: 5\' 7"  (1.702 m)     Weight: 80.8 kg (178 lb 2.1 oz)     SpO2: 100% 100% 100% 100%    Intake/Output Summary (Last 24 hours) at 06/17/13 1205 Last data filed at 06/16/13 1556  Gross per 24 hour  Intake    240 ml  Output      0 ml  Net    240 ml   Filed Weights   06/14/13 2143 06/14/13 2350 06/16/13 0918  Weight: 78.019 kg (172 lb) 80.8 kg (178 lb 2.1 oz) 80.8 kg (178 lb 2.1 oz)    Exam:   General:  No distress, alert, speaking in full sentences.   Cardiovascular: S 1, S 2 RRR  Respiratory: CTA, no wheezes.   Abdomen: Bs present,  soft, NT  Musculoskeletal: No edema.  Skin: rash bilateral lower extremities  improved. Rash plantar aspect right foot.   Data Reviewed: Basic Metabolic Panel:  Recent Labs Lab 06/14/13 2200 06/15/13 0325  NA 134* 137  K 3.7 3.9  CL 99 105  CO2 19 22  GLUCOSE 163* 149*  BUN 10 9  CREATININE 0.70 0.66  CALCIUM 9.9 9.0   Liver Function Tests:  Recent Labs Lab 06/14/13 2200  AST 20  ALT 13  ALKPHOS 72  BILITOT 0.4  PROT 8.3  ALBUMIN 4.2   No results found for this basename: LIPASE, AMYLASE,  in the last 168 hours No results found for this basename: AMMONIA,  in the last 168 hours CBC:  Recent Labs Lab 06/14/13 2200 06/15/13 0325  WBC 16.3* 14.9*  NEUTROABS 15.2*  --   HGB 13.2 11.3*  HCT 38.2 32.8*  MCV 94.6 95.6  PLT 294 242   Cardiac Enzymes: No results found for this basename: CKTOTAL, CKMB, CKMBINDEX, TROPONINI,  in the last 168 hours BNP (last 3 results) No results found for this basename: PROBNP,  in the last 8760 hours CBG: No results found for this basename: GLUCAP,  in the last 168 hours  Recent Results (from the past 240 hour(s))  MRSA PCR SCREENING     Status: None   Collection Time    06/14/13 11:54 PM  Result Value Range Status   MRSA by PCR NEGATIVE  NEGATIVE Final   Comment:            The GeneXpert MRSA Assay (FDA     approved for NASAL specimens     only), is one component of a     comprehensive MRSA colonization     surveillance program. It is not     intended to diagnose MRSA     infection nor to guide or     monitor treatment for     MRSA infections.     Studies: No results found.  Scheduled Meds: . diphenhydrAMINE  50 mg Oral Q6H  . enoxaparin (LOVENOX) injection  40 mg Subcutaneous Q24H  . famotidine (PEPCID) IV  20 mg Intravenous Q12H  . hydrocerin   Topical BID  . methylPREDNISolone (SOLU-MEDROL) injection  40 mg Intravenous Q8H  . sodium chloride  3 mL Intravenous Q12H   Continuous Infusions: . sodium  chloride 10 mL/hr at 06/16/13 0400    Principal Problem:   Anaphylaxis    Time spent: 25 minutes.     Rajon Bisig  [facility] Pager [phone]. If 7PM-7AM, please contact night-coverage at [email or web address], password [facility] 06/17/2013, 12:05 PM  LOS: 3 days

## 2013-06-18 MED ORDER — PREDNISONE (PAK) 10 MG PO TABS: 20.0000 mg | ORAL_TABLET | Freq: Every day | ORAL | Status: DC

## 2013-06-18 MED ORDER — FAMOTIDINE 20 MG PO TABS: 20.0000 mg | ORAL_TABLET | Freq: Two times a day (BID) | ORAL | Status: DC

## 2013-06-18 MED ORDER — DIPHENHYDRAMINE HCL 25 MG PO CAPS: 50.0000 mg | ORAL_CAPSULE | Freq: Four times a day (QID) | ORAL | Status: DC | PRN

## 2013-06-18 NOTE — Progress Notes (Signed)
Patient discharged home in stable condition.  No change from morning assessment.  Discharge instructions and scripts given to pt with verbal feedback and understanding.  No further questions or concerns at this time.

## 2013-06-18 NOTE — Discharge Summary (Signed)
Physician Discharge Summary  [name] AVW:[id number] DOB: December 02, 1976 DOA: 06/14/2013  PCP: Ernst Breach, PA-C  Admit date: 06/14/2013 Discharge date: 06/18/2013  Time spent: 35 minutes  Recommendations for Outpatient Follow-up:  1. Please follow ANA results.  2. Will need referral to allergen specialist.   Discharge Diagnoses:    Anaphylaxis reaction with urticaria, probably secondary to flagyl.    Discharge Condition: Stable.   Diet recommendation: Regular diet.   Filed Weights   06/14/13 2143 06/14/13 2350 06/16/13 0918  Weight: 78.019 kg (172 lb) 80.8 kg (178 lb 2.1 oz) 80.8 kg (178 lb 2.1 oz)    History of present illness:  Christine Gardner is a 37 y.o. female who was being treated for bacterial vaginosis with Flagyl and started developing a rash on her lower extremities which eventually spread to her upper extremities yesterday and had come to the ER and was given steroids, Pepcid and Benadryl and discharged home. Later in the evening she took her regular dose of Flagyl and later patient started developing the rash again. In the morning she again took the Flagyl dose and later started developing rash again extremities and had gone to her PCP who had given her IV steroids and oral prednisone and antihistamines. Later in the evening patient started developing hoarseness of her voice and difficulty breathing at this point patient presented to the ER. In the ER patient was given IM epinephrine followed by Pepcid and Benadryl. Patient has been admitted for further observation. On my exam patient does not have any tongue swelling or lip swelling and her rash has resolved. Patient otherwise denies any chest pain or shortness of breath now.   Hospital Course:  1-Anaphylactic reaction with urticaria: this could be secondary to flagyl. Patient was treated  with solumedrol IV  to every 8 hours, schedule benadryl,  nebulizer albuterol for bronchospasm as needed. Continue with Pepcid.   -Rash improved. Patient had on and off episodes of rash flare and lip swelling during admission. No more episodes in 24 hour periods.  -ESR less than 8 and ANA pending.  -She will need to follow up with allergen specialist.  -Patient will be discharge on prednisone taper over 7 days. Will provide prescription for benadryl PRN, and Pepcid.   2-Leukocytosis: reactive. Trending down. Not on antibiotics. UA negative for infections.  3-Right foot pain, rash anterior aspect: resolved. Probably secondary to allergic reaction.    Procedures:  None  Consultations:  None  Discharge Exam: Filed Vitals:   06/17/13 0500 06/17/13 1505 06/17/13 2200 06/18/13 0600  BP: 119/75 120/67 129/81 141/94  Pulse: 66 76 65 65  Temp: 98.4 F (36.9 C) 98.3 F (36.8 C) 98.7 F (37.1 C) 98.3 F (36.8 C)  TempSrc: Oral Oral Oral Oral  Resp: 18 18 18 18   Height:      Weight:      SpO2: 100% 100% 99% 100%    General: No distress.  Cardiovascular: S 1, S 2 RRR Respiratory: CTA Skin less hives.   Discharge Instructions  Discharge Orders   Future Orders Complete By Expires     Diet general  As directed     Increase activity slowly  As directed         Medication List    STOP taking these medications       cetirizine 10 MG tablet  Commonly known as:  ZYRTEC     ibuprofen 200 MG tablet  Commonly known as:  ADVIL,MOTRIN     metroNIDAZOLE  500 MG tablet  Commonly known as:  FLAGYL      TAKE these medications       diphenhydrAMINE 25 mg capsule  Commonly known as:  BENADRYL  Take 2 capsules (50 mg total) by mouth every 6 (six) hours as needed for itching (ITCHING).     famotidine 20 MG tablet  Commonly known as:  PEPCID  Take 1 tablet (20 mg total) by mouth 2 (two) times daily.     levonorgestrel 20 MCG/24HR IUD  Commonly known as:  MIRENA  1 each by Intrauterine route once.     predniSONE 10 MG tablet  Commonly known as:  STERAPRED UNI-PAK  Take 2 tablets (20 mg total) by mouth  daily. Take 3 tablet by mouth for 3 days, then take 2 tablet by mouth for 2 days then take 1 tablet by mouth for 2 days the half tablet for one day then stop.       Allergies  Allergen Reactions  . Flagyl (Metronidazole) Anaphylaxis  . Vicodin (Hydrocodone-Acetaminophen)     itch  . Percocet (Oxycodone-Acetaminophen) Itching  . Tramadol Itching       Follow-up Information   Follow up with Ernst Breach, PA-C Today.   Contact information:   [address] [place] [address] [phone]        The results of significant diagnostics from this hospitalization (including imaging, microbiology, ancillary and laboratory) are listed below for reference.    Significant Diagnostic Studies: No results found.  Microbiology: Recent Results (from the past 240 hour(s))  MRSA PCR SCREENING     Status: None   Collection Time    06/14/13 11:54 PM      Result Value Range Status   MRSA by PCR NEGATIVE  NEGATIVE Final   Comment:            The GeneXpert MRSA Assay (FDA     approved for NASAL specimens     only), is one component of a     comprehensive MRSA colonization     surveillance program. It is not     intended to diagnose MRSA     infection nor to guide or     monitor treatment for     MRSA infections.     Labs: Basic Metabolic Panel:  Recent Labs Lab 06/14/13 2200 06/15/13 0325  NA 134* 137  K 3.7 3.9  CL 99 105  CO2 19 22  GLUCOSE 163* 149*  BUN 10 9  CREATININE 0.70 0.66  CALCIUM 9.9 9.0   Liver Function Tests:  Recent Labs Lab 06/14/13 2200  AST 20  ALT 13  ALKPHOS 72  BILITOT 0.4  PROT 8.3  ALBUMIN 4.2   No results found for this basename: LIPASE, AMYLASE,  in the last 168 hours No results found for this basename: AMMONIA,  in the last 168 hours CBC:  Recent Labs Lab 06/14/13 2200 06/15/13 0325  WBC 16.3* 14.9*  NEUTROABS 15.2*  --   HGB 13.2 11.3*  HCT 38.2 32.8*  MCV 94.6 95.6  PLT 294 242   Cardiac Enzymes: No  results found for this basename: CKTOTAL, CKMB, CKMBINDEX, TROPONINI,  in the last 168 hours BNP: BNP (last 3 results) No results found for this basename: PROBNP,  in the last 8760 hours CBG: No results found for this basename: GLUCAP,  in the last 168 hours     Signed:  Sharnette Kitamura  [facility] 06/18/2013, 8:58 AM

## 2013-06-19 ENCOUNTER — Encounter: Payer: Self-pay | Admitting: Medical

## 2013-06-19 ENCOUNTER — Ambulatory Visit (INDEPENDENT_AMBULATORY_CARE_PROVIDER_SITE_OTHER): Payer: BC Managed Care – PPO | Admitting: Medical

## 2013-06-19 VITALS — BP 142/90 | HR 82 | Temp 98.7°F | Resp 16 | Wt 170.0 lb

## 2013-06-19 DIAGNOSIS — T788XXS Other adverse effects, not elsewhere classified, sequela: Secondary | ICD-10-CM

## 2013-06-19 DIAGNOSIS — L509 Urticaria, unspecified: Secondary | ICD-10-CM

## 2013-06-19 DIAGNOSIS — T782XXS Anaphylactic shock, unspecified, sequela: Secondary | ICD-10-CM

## 2013-06-19 MED ORDER — EPINEPHRINE 0.3 MG/0.3ML IJ SOAJ: 0.3000 mg | Freq: Once | INTRAMUSCULAR | Status: DC

## 2013-06-19 NOTE — Progress Notes (Signed)
Subjective: Here for hospital f/u.  Went to ED last week for difficulty breathing, hives, allergy reaction.  Was given steroids, famotidine, was discharged, came in to our office has additional steroid shot, went back to ED, and was hospitalized for anaphylaxis.  Ended up having Epipen injection, monitored in ICU.  Was discharged on steroid dosepack.  However, despite steroids, hives have flared up again last night and today.  Worried about this not improving.  Not sure of the trigger.  The only new exposure was Flagyl given last week by gynecology for BV, but has been off Flagyl now about a 5 days.  She denies any other new exposures.    Diet - [place], greek yogurt, peanut butter, protein bars, consistent diet.  She is a Agricultural engineer, wears latex free gloves.  Work exposures include quick scrub lotions, cucumber and aloe, nail polish, acetone.  Uses Advil occasionally  No recent plant exposure, no recent travel, no recent animal exposures.   No other aggravating or relieving factors.   Past Medical History  Diagnosis Date  . Type a blood, rh negative   . Seasonal allergic rhinitis   . Migraine   . IUD (intrauterine device) in place 11/2010    Mirena  . Routine gynecological examination     Dr. Normand Sloop, [place]  . H/O mammogram 2012    benign, eval due to right breast lump  . Vitamin D deficiency    ROS as in subjective  Objective: Filed Vitals:   06/19/13 1026  BP: 142/90  Pulse: 82  Temp: 98.7 F (37.1 C)  Resp: 16    General appearance: alert, no distress, WD/WN Skin: scattered whealed lesions of arms, legs, few on torso HEENT: normocephalic, sclerae anicteric, TMs pearly, nares patent, no discharge or erythema, pharynx normal Oral cavity: MMM, no lesions Neck: supple, no lymphadenopathy, no thyromegaly, no masses Heart: RRR, normal S1, S2, no murmurs Lungs: CTA bilaterally, no wheezes, rhonchi, or rales Pulses: 2+ symmetric, upper and lower extremities,  normal cap refill No edema  Assessment: Encounter Diagnoses  Name Primary?  Marland Kitchen Urticaria Yes  . Anaphylactic reaction, sequela      Plan: Etiology of her anaphylaxis and urticaria unclear.  reviewed labs, discharge summary.   despite current use of steroid pack and no improvement, we called to get her seen today by allergist for additional evaluation.  Follow-up with referral to allergist today .

## 2013-06-22 ENCOUNTER — Encounter: Payer: Self-pay | Admitting: Medical

## 2013-06-22 ENCOUNTER — Ambulatory Visit (INDEPENDENT_AMBULATORY_CARE_PROVIDER_SITE_OTHER): Payer: BC Managed Care – PPO | Admitting: Medical

## 2013-06-22 VITALS — BP 118/80 | HR 98 | Temp 98.5°F | Resp 18 | Wt 174.0 lb

## 2013-06-22 DIAGNOSIS — J309 Allergic rhinitis, unspecified: Secondary | ICD-10-CM

## 2013-06-22 DIAGNOSIS — R0602 Shortness of breath: Secondary | ICD-10-CM

## 2013-06-22 DIAGNOSIS — L509 Urticaria, unspecified: Secondary | ICD-10-CM

## 2013-06-22 MED ORDER — FAMOTIDINE 20 MG PO TABS: 20.0000 mg | ORAL_TABLET | Freq: Two times a day (BID) | ORAL | Status: DC

## 2013-06-22 NOTE — Progress Notes (Signed)
Subjective: Here for f/u.  Saw [place] earlier in the week post hospital f/u from urticaria and anaphylaxis.  She saw allergist Dr. [place] Callas 06/19/13, had labs, allergy testing.  Was started on several medications.  Overall the urticaria is much better, she has felt a little SOB though.   Uses the inhaler some but doesn't seem to help. Does make her jittery.  Her only other c/o today is that the hydroxyzine makes her too sleepy, can't function.  She has questions about her refills and f/u.   Past Medical History  Diagnosis Date  . Type a blood, rh negative   . Seasonal allergic rhinitis   . Migraine   . IUD (intrauterine device) in place 11/2010    Mirena  . Routine gynecological examination     Dr. Normand Sloop, [place]  . H/O mammogram 2012    benign, eval due to right breast lump  . Vitamin D deficiency   . Anaphylactic reaction 7/14    with urticaria   ROS as in subjective   Objective: Filed Vitals:   06/22/13 1018  BP: 118/80  Pulse: 98  Temp: 98.5 F (36.9 C)  Resp: 18    General appearance: alert, no distress, WD/WN  Skin: unremarkable today, no obvious hives HEENT: normocephalic, sclerae anicteric, TMs pearly, nares patent, no discharge or erythema, pharynx normal Oral cavity: MMM, no lesions Neck: supple, no lymphadenopathy, no thyromegaly, no masses Heart: RRR, normal S1, S2, no murmurs Lungs: CTA bilaterally, no wheezes, rhonchi, or rales Pulses: 2+ symmetric Ext: no edema   Assessment: Encounter Diagnoses  Name Primary?  Marland Kitchen Urticaria Yes  . Allergic rhinitis   . Shortness of breath      Plan: discussed her concerns.  Dr. Susann Givens examined patient as well and was involved with the plan.  reviewed allergy tests and notes from Dr. [place] Callas from 06/19/13.   Patient Instructions  Continue the present regimen as follows, per allergist and our recommendations:  continue Singulair/Montelukast 10mg  at bedtime  continue Xyzal/Levocetirizine 5mg  at  bedtime  Continue Famotidine 20mg  twice daily   Finish out the Prednisone dose pack.  Call me next week before you run out of medication so we can decide on the refill instructions.    The following are as needed medications:  Albuterol inhaler, 2 puffs every 4-6 hours  Hydroxyzine 25mg .  You can either do this just at bedtime, or up to 3 times daily if needed for itching  You can continue the Mometasone cream as needed  Practice incentive spirometry or deep breathing exercises once an hour Gradually increase back to your normal activity level

## 2013-06-22 NOTE — Addendum Note (Signed)
Addended by: Leretha Dykes L on: 06/22/2013 12:50 PM   Modules accepted: Orders

## 2013-06-22 NOTE — Patient Instructions (Addendum)
Continue the present regimen as follows, per allergist and our recommendations:  continue Singulair/Montelukast 10mg  at bedtime  continue Xyzal/Levocetirizine 5mg  at bedtime  Continue Famotidine 20mg  twice daily   Finish out the Prednisone dose pack.  Call me next week before you run out of medication so we can decide on the refill instructions.    The following are as needed medications:  Albuterol inhaler, 2 puffs every 4-6 hours  Hydroxyzine 25mg .  You can either do this just at bedtime, or up to 3 times daily if needed for itching  You can continue the Mometasone cream as needed  Practice incentive spirometry or deep breathing exercises once an hour Gradually increase back to your normal activity level

## 2013-06-28 ENCOUNTER — Telehealth: Payer: Self-pay | Admitting: Medical

## 2013-06-28 ENCOUNTER — Encounter: Payer: Self-pay | Admitting: Medical

## 2013-06-28 NOTE — Telephone Encounter (Signed)
Pt called with an update. She states she is feeling much better. She also states she does not need the refill on prednisone. She was able to get that straighten out and picked that up yesterday.

## 2013-06-29 NOTE — Telephone Encounter (Signed)
Glad to hear things are improved

## 2013-06-29 NOTE — Telephone Encounter (Signed)
There is not a telephone number in the chart to call the patient back so I am saving the message to the chart. CLS

## 2014-03-06 ENCOUNTER — Other Ambulatory Visit: Payer: Self-pay | Admitting: Obstetrics and Gynecology

## 2014-07-01 ENCOUNTER — Ambulatory Visit (INDEPENDENT_AMBULATORY_CARE_PROVIDER_SITE_OTHER): Payer: BC Managed Care – PPO | Admitting: Medical

## 2014-07-01 ENCOUNTER — Encounter: Payer: Self-pay | Admitting: Medical

## 2014-07-01 VITALS — BP 118/80 | HR 72 | Temp 98.1°F | Resp 14 | Ht 68.0 in | Wt 179.0 lb

## 2014-07-01 DIAGNOSIS — Z0000 Encounter for general adult medical examination without abnormal findings: Secondary | ICD-10-CM

## 2014-07-01 DIAGNOSIS — E559 Vitamin D deficiency, unspecified: Secondary | ICD-10-CM

## 2014-07-01 DIAGNOSIS — J3089 Other allergic rhinitis: Secondary | ICD-10-CM

## 2014-07-01 DIAGNOSIS — E663 Overweight: Secondary | ICD-10-CM

## 2014-07-01 DIAGNOSIS — J302 Other seasonal allergic rhinitis: Secondary | ICD-10-CM

## 2014-07-01 LAB — POCT URINALYSIS DIPSTICK
Bilirubin, UA: NEGATIVE
Blood, UA: NEGATIVE
Glucose, UA: NEGATIVE
Ketones, UA: NEGATIVE
NITRITE UA: NEGATIVE
PH UA: 5
Spec Grav, UA: <=1.005
UROBILINOGEN UA: NEGATIVE

## 2014-07-01 MED ORDER — MONTELUKAST SODIUM 10 MG PO TABS: 10.0000 mg | ORAL_TABLET | Freq: Every day | ORAL | Status: DC

## 2014-07-01 MED ORDER — PHENTERMINE HCL 37.5 MG PO TABS: 37.5000 mg | ORAL_TABLET | Freq: Every day | ORAL | Status: DC

## 2014-07-01 NOTE — Addendum Note (Signed)
Addended by: Carlena Hurl on: 07/01/2014 09:24 AM   Modules accepted: Orders

## 2014-07-01 NOTE — Patient Instructions (Signed)
  Thank you for giving me the opportunity to serve you today.    Your diagnosis today includes: Encounter Diagnoses  Name Primary?  . Routine general medical examination at a health care facility Yes  . Overweight   . Unspecified vitamin D deficiency   . Other seasonal allergic rhinitis      Specific recommendations today include:  Take a daily multivitamin  Begin vitamin D 1000 international units over-the-counter daily  Exterior getting about 1200 mg of calcium through diet and/or supplement  Continue healthy diet and exercise with effort on weight loss  Begin trial of phentermine once daily in the morning  See eye doctor, dentist, gynecologist, and me yearly for routine care  Recheck with me in 1 month regarding weight loss efforts  Begin back on Singulair and or daily antihistamine in mid to late August  Return in 4-6 weeks regarding Phentermine.
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
## 2014-08-07 ENCOUNTER — Ambulatory Visit: Payer: BC Managed Care – PPO | Admitting: Medical

## 2014-08-14 ENCOUNTER — Encounter: Payer: Self-pay | Admitting: Medical

## 2014-08-14 ENCOUNTER — Ambulatory Visit (INDEPENDENT_AMBULATORY_CARE_PROVIDER_SITE_OTHER): Payer: BC Managed Care – PPO | Admitting: Medical

## 2014-08-14 VITALS — BP 130/90 | HR 88 | Temp 98.5°F | Resp 16 | Wt 176.0 lb

## 2014-08-14 DIAGNOSIS — R030 Elevated blood-pressure reading, without diagnosis of hypertension: Secondary | ICD-10-CM

## 2014-08-14 DIAGNOSIS — J309 Allergic rhinitis, unspecified: Secondary | ICD-10-CM

## 2014-08-14 DIAGNOSIS — R6889 Other general symptoms and signs: Secondary | ICD-10-CM

## 2014-08-14 DIAGNOSIS — E663 Overweight: Secondary | ICD-10-CM

## 2014-08-14 DIAGNOSIS — E559 Vitamin D deficiency, unspecified: Secondary | ICD-10-CM

## 2014-08-14 DIAGNOSIS — R899 Unspecified abnormal finding in specimens from other organs, systems and tissues: Secondary | ICD-10-CM

## 2014-08-14 MED ORDER — NALTREXONE-BUPROPION HCL ER 8-90 MG PO TB12: ORAL_TABLET | ORAL | Status: DC

## 2014-08-14 NOTE — Progress Notes (Signed)
Subjective: Here for followup. I saw her in July for physical and at that time she was having lots of difficulty losing weight despite healthy diet and exercise. She start a trial of phentermine. The first week she fell hyperactive so only did one half tablet a day and continued to do one half tablet daily up until this past week when she increase to a whole tablet daily.  She feels fine on the medication now, no particular complaints, no sleep issues, no chest pain or palpitations. Not checking blood pressure.  She is exercising regularly with running and using a personal trainer.  She continues to eat a healthy diet.  Caffeine is 1 cup of coffee in the morning. She does feel like the medication is helping some. She would like to get down to 166 pounds.  Since last visit she has not started the Singulair or antihistamine but she is starting to get some allergy symptoms now  Is taking multivitamin, but not sure the amount of vitamin D in the multivitamin.  ROS as in subjective  Objective: BP 130/90  Pulse 88  Temp(Src) 98.5 F (36.9 C) (Oral)  Resp 16  Wt 176 lb (79.833 kg)  Wt Readings from Last 3 Encounters:  08/14/14 176 lb (79.833 kg)  07/01/14 179 lb (81.194 kg)  06/22/13 174 lb (78.926 kg)    General appearance: alert, no distress, WD/WN Neck: supple, no lymphadenopathy, no thyromegaly, no masses Heart: RRR, normal S1, S2, no murmurs Lungs: CTA bilaterally, no wheezes, rhonchi, or rales Pulses: 2+ symmetric, upper and lower extremities, normal cap refill Ext: no edema   Assessment: Encounter Diagnoses  Name Primary?  Marland Kitchen Overweight Yes  . Allergic rhinitis, unspecified allergic rhinitis type   . Elevated blood pressure reading without diagnosis of hypertension   . Unspecified vitamin D deficiency   . Abnormal laboratory test      Plan: Overweight -I am concerned about the bump in the blood pressure today and the lack of any real improvement on the phentermine. Advise  she not take this anymore.  She will check insurance coverage for other options we discussed, Contrave, Belviq, possibly Qsymia with the lower phentermine dose.  She'll continue healthy diet and exercise.  The goal is to help her get to goal weight but not remain on these medications. Prescription sent for Contrave, discussed risk and benefits of the medication and proper use.  F/u 59mo.  Elevated blood pressure-she will start checking her blood pressures a few times per week and let me know in the next few weeks what her blood pressures are running  Allergies-advise she begin Singulair and OTC antihistamine daily QHS  Vitamin D deficiency-reiterated the need to take vitamin D 1000 units daily  Abnormal lab test - of note after reviewing her chart her last labs in June of last year were normal but there were followup labs in July when she was seen in the hospital that had minor abnormalities.  We will consider rechecking her blood count and glucose in a month or 2

## 2014-08-14 NOTE — Patient Instructions (Signed)
Check insurance coverage for the following:  Qsymia  Contrave  Belviq  These are other options for weight loss medication.

## 2014-08-19 ENCOUNTER — Telehealth: Payer: Self-pay | Admitting: Medical

## 2014-08-22 NOTE — Telephone Encounter (Signed)
P.A. Approved Contrave til 02/14/15, Left message for pt, faxed pharmacy

## 2014-10-07 ENCOUNTER — Encounter: Payer: Self-pay | Admitting: Medical

## 2014-12-06 DIAGNOSIS — I10 Essential (primary) hypertension: Secondary | ICD-10-CM

## 2014-12-06 HISTORY — DX: Essential (primary) hypertension: I10

## 2014-12-20 ENCOUNTER — Ambulatory Visit: Payer: Self-pay | Admitting: Medical

## 2014-12-24 ENCOUNTER — Ambulatory Visit: Payer: Self-pay | Admitting: Medical

## 2014-12-25 ENCOUNTER — Ambulatory Visit (INDEPENDENT_AMBULATORY_CARE_PROVIDER_SITE_OTHER): Payer: BLUE CROSS/BLUE SHIELD | Admitting: Medical

## 2014-12-25 ENCOUNTER — Encounter: Payer: Self-pay | Admitting: Medical

## 2014-12-25 VITALS — BP 148/90 | HR 83 | Temp 98.3°F | Resp 15 | Wt 174.0 lb

## 2014-12-25 DIAGNOSIS — R030 Elevated blood-pressure reading, without diagnosis of hypertension: Secondary | ICD-10-CM

## 2014-12-25 DIAGNOSIS — R7301 Impaired fasting glucose: Secondary | ICD-10-CM | POA: Diagnosis not present

## 2014-12-25 DIAGNOSIS — R5383 Other fatigue: Secondary | ICD-10-CM | POA: Diagnosis not present

## 2014-12-25 LAB — CBC WITH DIFFERENTIAL/PLATELET
BASOS PCT: 0 % (ref 0–1)
Basophils Absolute: 0 10*3/uL (ref 0.0–0.1)
EOS ABS: 0.1 10*3/uL (ref 0.0–0.7)
Eosinophils Relative: 2 % (ref 0–5)
HEMATOCRIT: 40.3 % (ref 36.0–46.0)
Hemoglobin: 13.4 g/dL (ref 12.0–15.0)
LYMPHS ABS: 2 10*3/uL (ref 0.7–4.0)
Lymphocytes Relative: 29 % (ref 12–46)
MCH: 31.9 pg (ref 26.0–34.0)
MCHC: 33.3 g/dL (ref 30.0–36.0)
MCV: 96 fL (ref 78.0–100.0)
MONOS PCT: 5 % (ref 3–12)
MPV: 10.4 fL (ref 8.6–12.4)
Monocytes Absolute: 0.3 10*3/uL (ref 0.1–1.0)
Neutro Abs: 4.4 10*3/uL (ref 1.7–7.7)
Neutrophils Relative %: 64 % (ref 43–77)
Platelets: 281 10*3/uL (ref 150–400)
RBC: 4.2 MIL/uL (ref 3.87–5.11)
RDW: 13.1 % (ref 11.5–15.5)
WBC: 6.9 10*3/uL (ref 4.0–10.5)

## 2014-12-25 LAB — COMPREHENSIVE METABOLIC PANEL
ALK PHOS: 68 U/L (ref 39–117)
ALT: 9 U/L (ref 0–35)
AST: 14 U/L (ref 0–37)
Albumin: 4.5 g/dL (ref 3.5–5.2)
BILIRUBIN TOTAL: 0.6 mg/dL (ref 0.2–1.2)
BUN: 14 mg/dL (ref 6–23)
CO2: 28 mEq/L (ref 19–32)
CREATININE: 0.72 mg/dL (ref 0.50–1.10)
Calcium: 9.9 mg/dL (ref 8.4–10.5)
Chloride: 101 mEq/L (ref 96–112)
GLUCOSE: 94 mg/dL (ref 70–99)
POTASSIUM: 3.8 meq/L (ref 3.5–5.3)
Sodium: 135 mEq/L (ref 135–145)
TOTAL PROTEIN: 8.3 g/dL (ref 6.0–8.3)

## 2014-12-25 LAB — POCT URINE PREGNANCY: Preg Test, Ur: NEGATIVE

## 2014-12-25 LAB — TSH: TSH: 1.686 u[IU]/mL (ref 0.350–4.500)

## 2014-12-25 NOTE — Progress Notes (Signed)
Subjective: Here for c/o fatigue.  A week ago extreme fatigue out of no where.  Mild cough few days, then felt better.  Went on menstrual period last week.   This past weekend felt fatigued again.  Lasted a few days, then a little improved.  Lately driving home from work felt very fatigued like she could go right to sleep.  No fever, didn't feel bad otherwise, just fatigued.  denies depression.  Exercising with home program, few days per week, water intake good.  Tries to live healthy lifestyle.  Periods regular.  No other symptoms.   Not checking BP although elevated last visit.   significant family hx/o HTN.  No other aggravating or relieving factors. No other complaint.No other aggravating or relieving factors. No other complaint.  Review of Systems Constitutional: -fever, -chills, -sweats, -unexpected weight change,+fatigue Skin: no rash, no skin changes ENT: -runny nose, -ear pain, -sore throat Cardiology:  -chest pain, -palpitations, -edema Respiratory:  -shortness of breath, -wheezing Gastroenterology: -abdominal pain, -nausea, -vomiting, -diarrhea, -constipation  Hematology: -bleeding or bruising problems Musculoskeletal: -arthralgias, -myalgias, -joint swelling, -back pain Ophthalmology: -vision changes Urology: -dysuria, -difficulty urinating, -hematuria, -urinary frequency, -urgency Neurology: +headache x 2 days this past week, sharp piercing, 2 days in a row, then resolved with advil, -weakness, -tingling, -numbness   Past Medical History  Diagnosis Date  . Type a blood, rh negative   . Seasonal allergic rhinitis     allergy testing 06/2013 with [place]  . Migraine   . IUD (intrauterine device) in place 11/2010    Mirena  . Routine gynecological examination     [name], [place]  . H/O mammogram 2012    benign, eval due to right breast lump  . Vitamin D deficiency   . Anaphylactic reaction 7/14    with urticaria; Flagyl   Objective: Filed Vitals:   12/25/14 1538  BP: 148/90  Pulse: 83  Temp: 98.3 F (36.8 C)  Resp: 15   BP Readings from Last 3 Encounters:  12/25/14 148/90  08/14/14 130/90  07/01/14 118/80    General appearance: alert, no distress, WD/WN HEENT: normocephalic, sclerae anicteric, PERRLA, EOMi, nares patent, no discharge or erythema, pharynx normal Oral cavity: MMM, no lesions Neck: supple, no lymphadenopathy, no thyromegaly, no masses Heart: RRR, normal S1, S2, no murmurs Lungs: CTA bilaterally, no wheezes, rhonchi, or rales Abdomen: +bs, soft, non tender, non distended, no masses, no hepatomegaly, no splenomegaly Extremities: no edema, no cyanosis, no clubbing Pulses: 2+ symmetric, upper and lower extremities, normal cap refill Neurological: alert, oriented x 3, CN2-12 intact, strength normal upper extremities and lower extremities, sensation normal throughout, DTRs 2+ throughout, no cerebellar signs, gait normal Psychiatric: normal affect, behavior normal, pleasant    Assessment: Encounter Diagnoses  Name Primary?  . Other fatigue Yes  . Elevated blood pressure reading without diagnosis of hypertension   . Impaired fasting blood sugar     Plan:  Etiology unclear.  Reviewed last physical notes from 06/2014 and last labs 2014.  Prior elevated glucose, mild anemia noted.  IUD in place, no concern for depression or seasonal affective disorder.  BPs elevated on last few visits, strong family hx/o HTN.   Labs today, c/t healthy lifestyle, routine exercise, healthy diet, f/u pending labs.  She will get me BP readings outside of here in the next few weeks.

## 2014-12-26 LAB — HEMOGLOBIN A1C
HEMOGLOBIN A1C: 5.3 % (ref <5.7)
MEAN PLASMA GLUCOSE: 105 mg/dL (ref <117)

## 2014-12-30 LAB — POCT URINALYSIS DIPSTICK
Bilirubin, UA: NEGATIVE
GLUCOSE UA: NEGATIVE
Ketones, UA: NEGATIVE
Leukocytes, UA: NEGATIVE
Nitrite, UA: NEGATIVE
PH UA: 6.5
PROTEIN UA: NEGATIVE
RBC UA: NEGATIVE
Spec Grav, UA: 1.025
Urobilinogen, UA: NEGATIVE

## 2014-12-30 NOTE — Addendum Note (Signed)
Addended by: Armanda Magic on: 12/30/2014 10:13 AM   Modules accepted: Orders

## 2015-01-01 ENCOUNTER — Telehealth: Payer: Self-pay | Admitting: Medical

## 2015-01-01 NOTE — Telephone Encounter (Signed)
error 

## 2015-01-02 NOTE — Progress Notes (Signed)
LM to [facility] 

## 2015-02-20 ENCOUNTER — Other Ambulatory Visit: Payer: Self-pay | Admitting: Medical

## 2015-02-20 NOTE — Telephone Encounter (Signed)
Call out 83mo of phentermine

## 2015-02-20 NOTE — Telephone Encounter (Signed)
LM to [facility] 

## 2015-02-20 NOTE — Telephone Encounter (Signed)
I believe I last refilled this in 09/2014.   Verify has she been taking this or when was last script picked up. I don't think we discussed last visit.  May need appt but let me know.  I usually see patients every 1-2 mo while on weight loss medication

## 2015-02-20 NOTE — Telephone Encounter (Signed)
Patient states she last used phentermine July 2015. She did not fill the Rx from October. She would like to restart Rx if you approve. She also notes that her B/p readings have been running 128/88. Please advise re phentermine

## 2015-02-20 NOTE — Telephone Encounter (Signed)
Ok to RF? 

## 2015-02-21 NOTE — Telephone Encounter (Signed)
Is this okay to refill? Or is she due for a visit

## 2015-05-06 ENCOUNTER — Ambulatory Visit: Payer: BLUE CROSS/BLUE SHIELD | Admitting: Medical

## 2015-05-07 ENCOUNTER — Ambulatory Visit (INDEPENDENT_AMBULATORY_CARE_PROVIDER_SITE_OTHER): Payer: BLUE CROSS/BLUE SHIELD | Admitting: Medical

## 2015-05-07 VITALS — BP 132/90 | HR 83 | Resp 15 | Wt 174.0 lb

## 2015-05-07 DIAGNOSIS — I10 Essential (primary) hypertension: Secondary | ICD-10-CM

## 2015-05-07 NOTE — Progress Notes (Signed)
Subjective Here for f/u on elevated BPs.   The last few visits her BPs have been elevated.  She has been checking BPs at home, has a cuff now, wrist cuff.   Lately for past month or more running SBP 120-130s and DBP 80-90s.  Exercising regularly, 3- 5 days per week.   Eating healthy.   Not adding salt to foods.  No canned foods,has cut out red meat, and avoids fried foods.  No concerns for sleep apnea.  No other aggravating or relieving factors. No other complaint.   Past Medical History  Diagnosis Date  . Type a blood, rh negative   . Seasonal allergic rhinitis     allergy testing 06/2013 with [place]  . Migraine   . IUD (intrauterine device) in place 11/2010    Mirena  . Routine gynecological examination     [name], [place]  . H/O mammogram 2012    benign, eval due to right breast lump  . Vitamin D deficiency   . Anaphylactic reaction 7/14    with urticaria; Flagyl   Family History  Problem Relation Age of Onset  . Hyperlipidemia Mother   . Migraines Mother   . Hypertension Mother   . Hyperlipidemia Maternal Grandmother   . Diabetes Maternal Grandmother   . Heart disease Father 15    died of MI  . Other Father     quesionable drug abuse  . Hypertension Sister   . Hypertension Brother   . Stroke Neg Hx   . Cancer Neg Hx   . Hypertension Sister     Objective: BP 132/90 mmHg  Pulse 83  Resp 15  Wt 174 lb (78.926 kg)  BP Readings from Last 3 Encounters:  05/07/15 132/90  12/25/14 148/90  08/14/14 130/90   General appearance: alert, no distress, WD/WN, AA female Neck: supple, no lymphadenopathy, no thyromegaly, no masses, no bruits Heart: RRR, normal S1, S2, no murmurs Lungs: CTA bilaterally, no wheezes, rhonchi, or rales Abdomen: +bs, soft, non tender, non distended, no masses, no hepatomegaly, no splenomegaly, no bruits Pulses: 2+ symmetric, upper and lower extremities, normal cap refill Ext: no edema    Adult ECG Report  Indication:  elevated BPs  Rate: 69 bpm  Rhythm: normal sinus rhythm  QRS Axis: 51 degrees  PR Interval: 176ms  QRS Duration: 25ms  QTc: 431ms  Conduction Disturbances: none  Other Abnormalities: none  Patient's cardiac risk factors are: hypertension.  EKG comparison: none  Narrative Interpretation: normal EKG    Assessment: Encounter Diagnosis  Name Primary?  . Essential hypertension Yes     Plan: Reviewed recent visit notes, labs from last physical.   Reviewed last several BPs here and her home readings.   She is already practicing a healthy lifestyle. Given findings, has new diagnosis of hypertension.  Discussed diagnosis, lifestyle, diet, exercise.  She declines medication, wants to monitor BPs a little longer.  At her physical in July, if numbers still, then begin Norvasc 5mg  daily . of note, IUD in place. F/u July for physical, labs including microabumin and lipid.

## 2015-05-12 ENCOUNTER — Telehealth: Payer: Self-pay | Admitting: Family Medicine

## 2015-05-12 ENCOUNTER — Other Ambulatory Visit: Payer: Self-pay | Admitting: Medical

## 2015-05-12 MED ORDER — AMLODIPINE BESYLATE 5 MG PO TABS: 5.0000 mg | ORAL_TABLET | Freq: Every day | ORAL | Status: DC

## 2015-05-12 NOTE — Telephone Encounter (Signed)
Begin Amlodipine 5mg  daily in the morning . Recheck in 4-6 wk on BP

## 2015-05-12 NOTE — Telephone Encounter (Signed)
Pt called and states go ahead and rx her a bp med.  Originally she had said she did not want a diurectic in it, but it that is the safest one for her kidneys then that will be fine.  [facility] [address].

## 2015-05-12 NOTE — Telephone Encounter (Signed)
Patient is aware of the medication and to follow up here in 6 weeks.

## 2015-06-11 ENCOUNTER — Telehealth: Payer: Self-pay | Admitting: Medical

## 2015-06-11 ENCOUNTER — Ambulatory Visit (INDEPENDENT_AMBULATORY_CARE_PROVIDER_SITE_OTHER): Payer: BLUE CROSS/BLUE SHIELD | Admitting: Medical

## 2015-06-11 ENCOUNTER — Encounter: Payer: Self-pay | Admitting: Medical

## 2015-06-11 VITALS — BP 120/86 | HR 80 | Temp 98.1°F | Resp 16 | Ht 68.0 in | Wt 177.0 lb

## 2015-06-11 DIAGNOSIS — I10 Essential (primary) hypertension: Secondary | ICD-10-CM | POA: Diagnosis not present

## 2015-06-11 DIAGNOSIS — Z0000 Encounter for general adult medical examination without abnormal findings: Secondary | ICD-10-CM

## 2015-06-11 DIAGNOSIS — K5909 Other constipation: Secondary | ICD-10-CM

## 2015-06-11 DIAGNOSIS — K5900 Constipation, unspecified: Secondary | ICD-10-CM | POA: Diagnosis not present

## 2015-06-11 LAB — POCT URINALYSIS DIPSTICK
BILIRUBIN UA: NEGATIVE
GLUCOSE UA: NEGATIVE
Ketones, UA: NEGATIVE
Leukocytes, UA: NEGATIVE
Nitrite, UA: NEGATIVE
PH UA: 6.5
Protein, UA: NEGATIVE
RBC UA: NEGATIVE
Spec Grav, UA: 1.02
Urobilinogen, UA: NEGATIVE

## 2015-06-11 LAB — LIPID PANEL
Cholesterol: 141 mg/dL (ref 0–200)
HDL: 53 mg/dL (ref >=46)
LDL Cholesterol: 75 mg/dL (ref 0–99)
TRIGLYCERIDES: 65 mg/dL (ref <150)
Total CHOL/HDL Ratio: 2.7 Ratio
VLDL: 13 mg/dL (ref 0–40)

## 2015-06-11 MED ORDER — AMLODIPINE BESYLATE 5 MG PO TABS: 5.0000 mg | ORAL_TABLET | Freq: Every day | ORAL | Status: DC

## 2015-06-11 NOTE — Telephone Encounter (Signed)
pls fax referral to [facility] for constipation study

## 2015-06-11 NOTE — Telephone Encounter (Signed)
I fax the patient's information over to [facility] per Davif Tysinger PA-C orders and [facility] will contact the patient

## 2015-06-11 NOTE — Progress Notes (Signed)
Subjective:   HPI  Christine Gardner is a 39 y.o. female who presents for a complete physical.  Preventative care: Last ophthalmology visit: YES SEE'S AT [place] 2016 Last dental visit: [place] Last colonoscopy: N/A Last mammogram: SEEN TODAY 06/11/15 Last gynecological exam:YES SEEN AT [facility] THIS YEAR 2016 Last EKG:6 /2016 Last labs:?  Prior vaccinations: TD or Tdap:2014 Influenza:NEVER Pneumococcal:N/A  Concerns: HTN - not seeing improvements on DBP.    Reviewed their medical, surgical, family, social, medication, and allergy history and updated chart as appropriate.  Past Medical History  Diagnosis Date  . Type a blood, rh negative   . Seasonal allergic rhinitis     allergy testing 06/2013 with [place]  . Migraine   . IUD (intrauterine device) in place 11/2010    Mirena  . Routine gynecological examination     [name], [place]  . H/O mammogram 2012    benign, eval due to right breast lump  . Vitamin D deficiency   . Anaphylactic reaction 7/14    with urticaria; Flagyl  . Hypertension 2016    Past Surgical History  Procedure Laterality Date  . Colposcopy  2013    LGSIL, f/u q42mo as of 6/14  . Toe surgery      hammer toe 4th, 5th, bilat    History   Social History  . Marital Status: Married    Spouse Name: N/A  . Number of Children: N/A  . Years of Education: N/A   Occupational History  . Not on file.   Social History Main Topics  . Smoking status: Never Smoker   . Smokeless tobacco: Never Used  . Alcohol Use: 1.2 oz/week    2 Glasses of wine per week  . Drug Use: No  . Sexual Activity: Yes    Birth Control/ Protection: IUD     Comment: mirena /vas    Other Topics Concern  . Not on file   Social History Narrative   Divorced, dating, 3 children, teens. Oldest son is attending [employer] as of fall 2015.  Exercise 3-4 days per week with treadmill, elliptical, weights.  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  [employer].      Family History  Problem Relation Age of Onset  . Hyperlipidemia Mother   . Migraines Mother   . Hypertension Mother   . Diabetes Mother   . Hyperlipidemia Maternal Grandmother   . Diabetes Maternal Grandmother   . Heart disease Father 5    died of MI  . Other Father     quesionable drug abuse  . Hypertension Sister   . Hypertension Brother   . Stroke Neg Hx   . Cancer Neg Hx   . Hypertension Sister      Current outpatient prescriptions:  .  amLODipine (NORVASC) 5 MG tablet, Take 1 tablet (5 mg total) by mouth daily., Disp: 90 tablet, Rfl: 3 .  EPINEPHrine (EPIPEN) 0.3 mg/0.3 mL SOAJ, Inject 0.3 mLs (0.3 mg total) into the muscle once., Disp: 1 Device, Rfl: 1 .  levonorgestrel (MIRENA) 20 MCG/24HR IUD, 1 each by Intrauterine route once., Disp: , Rfl:  .  Probiotic Product (PROBIOTIC DAILY PO), Take by mouth., Disp: , Rfl:   Allergies  Allergen Reactions  . Flagyl [Metronidazole] Anaphylaxis  . Vicodin [Hydrocodone-Acetaminophen]     itch  . Percocet [Oxycodone-Acetaminophen] Itching  . Tramadol Itching   Review of Systems Constitutional: -fever, -chills, -  sweats, -unexpected weight change, -decreased appetite, -fatigue Allergy: -sneezing, -itching, -congestion Dermatology: -changing moles, --rash, -lumps ENT: -runny nose, -ear pain, -sore throat, -hoarseness, -sinus pain, -teeth pain, - ringing in ears, -hearing loss, -nosebleeds Cardiology: -chest pain, -palpitations, -swelling, -difficulty breathing when lying flat, -waking up short of breath Respiratory: -cough, -shortness of breath, -difficulty breathing with exercise or exertion, -wheezing, -coughing up blood Gastroenterology: -abdominal pain, -nausea, -vomiting, -diarrhea, -constipation, -blood in stool, -changes in bowel movement, -difficulty swallowing or eating Hematology: -bleeding, -bruising  Musculoskeletal: -joint aches, -muscle aches, -joint swelling, -back pain,  -neck pain, -cramping, -changes in gait Ophthalmology: denies vision changes, eye redness, itching, discharge Urology: -burning with urination, -difficulty urinating, -blood in urine, -urinary frequency, -urgency, -incontinence Neurology: -headache, -weakness, -tingling, -numbness, -memory loss, -falls, -dizziness Psychology: -depressed mood, -agitation, -sleep problems     Objective:   Physical Exam  BP 120/86 mmHg  Pulse 80  Temp(Src) 98.1 F (36.7 C) (Oral)  Resp 16  Ht 5\' 8"  (1.727 m)  Wt 177 lb (80.287 kg)  BMI 26.92 kg/m2  BP Readings from Last 3 Encounters:  06/11/15 120/86  05/07/15 132/90  12/25/14 148/90   General appearance: alert, no distress, WD/WN, pleasant AA female  Skin: tattoo right lower flank/abdomen, tattoo lower half of back, no worrisome lesions  HEENT: normocephalic, conjunctiva/corneas normal, sclerae anicteric, PERRLA, EOMi, nares patent, no discharge or erythema, pharynx normal Oral cavity: MMM, tongue normal, teeth in good repair  Neck: supple, no lymphadenopathy, no thyromegaly, no masses, normal ROM  Heart: RRR, normal S1, S2, no murmurs  Lungs: CTA bilaterally, no wheezes, rhonchi, or rales  Abdomen: +bs, soft, non tender, non distended, no masses, no hepatomegaly, no splenomegaly, no bruits  Back: non tender, normal ROM, no scoliosis  Musculoskeletal: upper extremities non tender, no obvious deformity, normal ROM throughout, lower extremities non tender, no obvious deformity, normal ROM throughout  Extremities: no edema, no cyanosis, no clubbing  Pulses: 2+ symmetric, upper and lower extremities, normal cap refill  Neurological: alert, oriented x 3, CN2-12 intact, strength normal upper extremities and lower extremities, sensation normal throughout, DTRs 2+ throughout, no cerebellar signs, gait normal  Psychiatric: normal affect, behavior normal, pleasant  Breast/gyn/rectal - deferred to gynecology   Assessment and Plan :     Encounter Diagnoses  Name Primary?  . Adult general medical exam Yes  . Essential hypertension   . Chronic constipation     Physical exam - discussed healthy lifestyle, diet, exercise, preventative care, vaccinations, and addressed their concerns.  Handout given. HTN - 120/86 with my reading.  C/t exercise, healthy diet, avoiding salt, c/t Norvasc 5mg  daily, dont check BPs daily to obsession, just periodically Chronic constipation - referral to pharmquest study for constipation See your eye doctor yearly for routine vision care. See your dentist yearly for routine dental care including hygiene visits twice yearly. See your gynecologist yearly for routine gynecological care.  Mammogram today, gets IUD out 11/2015. Follow-up pending labs

## 2015-06-12 LAB — MICROALBUMIN / CREATININE URINE RATIO
Creatinine, Urine: 115 mg/dL
MICROALB UR: 0.4 mg/dL (ref <2.0)
Microalb Creat Ratio: 3.5 mg/g (ref 0.0–30.0)

## 2015-06-18 ENCOUNTER — Encounter: Payer: Self-pay | Admitting: Family Medicine

## 2015-07-08 ENCOUNTER — Encounter: Payer: Self-pay | Admitting: Medical

## 2015-07-08 ENCOUNTER — Ambulatory Visit (INDEPENDENT_AMBULATORY_CARE_PROVIDER_SITE_OTHER): Payer: BLUE CROSS/BLUE SHIELD | Admitting: Medical

## 2015-07-08 VITALS — BP 130/80 | HR 91 | Temp 98.9°F

## 2015-07-08 DIAGNOSIS — R21 Rash and other nonspecific skin eruption: Secondary | ICD-10-CM | POA: Diagnosis not present

## 2015-07-08 DIAGNOSIS — Z9109 Other allergy status, other than to drugs and biological substances: Secondary | ICD-10-CM

## 2015-07-08 DIAGNOSIS — Z91048 Other nonmedicinal substance allergy status: Secondary | ICD-10-CM | POA: Diagnosis not present

## 2015-07-08 DIAGNOSIS — L299 Pruritus, unspecified: Secondary | ICD-10-CM | POA: Diagnosis not present

## 2015-07-08 MED ORDER — TRIAMCINOLONE ACETONIDE 0.1 % EX CREA: 1.0000 "application " | TOPICAL_CREAM | Freq: Two times a day (BID) | CUTANEOUS | Status: DC

## 2015-07-08 NOTE — Patient Instructions (Signed)
Recommendations:  Restart Singular daily at bedtime from now til november, primarily due to history grass allergies  For the next 3- 5 days, use Benadryl twice daily along with singulair or Allegra twice daily  Use the Triamcinolone cream topically twice daily as needed for the next 3- 5 days  Consider using hypoallergenic detergent for the next months  Call if worse or not improving

## 2015-07-08 NOTE — Progress Notes (Signed)
Subjective:   Christine Gardner is a 39 y.o. female who presents for evaluation of a rash involving the leg. Rash started 2 days ago. Lesions are skin to red color, and slightly bumpy in texture. Rash has changed over time, worse yesterday, improved today with oral benadryl. Rash is pruritic. Associated symptoms: none. Patient denies: fever, headache, irritability and swelling. Patient has not had contacts with similar rash. Patient has not had new exposures.   She did clothes washing and house cleaning Sunday, but no new exposures.  A week or 2 ago had some itching in the mouth after eating [place] nuts.  This calmed down with benadryl and avoidance of the nuts.   No other aggravating or relieving factors. No other complaint.  The following portions of the patient's history were reviewed and updated as appropriate: allergies, current medications, past family history, past medical history, past social history and problem list.  Review of Systems As in subjective above   Objective:   Gen: wd, wn, nad Skin:there is a faint prickly flesh colored rash along forearms and lateral torso, bilat chest wall, but no distinct rash otherwise.  This rash is faint. No other abnormal skin findings No oral angioedema   Assessment:   Encounter Diagnoses  Name Primary?  . Environmental allergies Yes  . Rash and nonspecific skin eruption   . Pruritic condition       Plan:   I reviewed back over her prior 2014 allergist notes and labs where she has numerous tree and grass allergies.   She likely has environmental allergen trigger causing the current symptoms.    Patient Instructions  Recommendations:  Restart Singular daily at bedtime from now til november, primarily due to history grass allergies  For the next 3- 5 days, use Benadryl twice daily along with singulair or Allegra twice daily  Use the Triamcinolone cream topically twice daily as needed for the next 3- 5 days  Consider using hypoallergenic  detergent for the next months  Call if worse or not improving

## 2015-07-11 ENCOUNTER — Telehealth: Payer: Self-pay | Admitting: Medical

## 2015-07-11 ENCOUNTER — Other Ambulatory Visit: Payer: Self-pay

## 2015-07-11 NOTE — Telephone Encounter (Signed)
She needs to be taking 4 Advil 3 times per day. If she is then call in 20 tramadol 50 mg 1 every 4 hours when necessary pain

## 2015-07-11 NOTE — Telephone Encounter (Signed)
Pt called wanting to know if something could be called in for a headache. Pt has taking advil and goody's powder and noting working. Pt was just seen 07-08-2015 [facility] [address]

## 2015-07-11 NOTE — Telephone Encounter (Signed)
I have called and left word for word message about the Advil if she calls back and said she has been doing this she has an allergy to Tramadol so what else would you like to give

## 2015-07-15 ENCOUNTER — Ambulatory Visit (INDEPENDENT_AMBULATORY_CARE_PROVIDER_SITE_OTHER): Payer: BLUE CROSS/BLUE SHIELD | Admitting: Family Medicine

## 2015-07-15 ENCOUNTER — Encounter: Payer: Self-pay | Admitting: Family Medicine

## 2015-07-15 VITALS — BP 126/80 | HR 72 | Temp 98.7°F | Wt 175.6 lb

## 2015-07-15 DIAGNOSIS — R300 Dysuria: Secondary | ICD-10-CM | POA: Diagnosis not present

## 2015-07-15 DIAGNOSIS — M545 Low back pain, unspecified: Secondary | ICD-10-CM

## 2015-07-15 DIAGNOSIS — N3000 Acute cystitis without hematuria: Secondary | ICD-10-CM

## 2015-07-15 DIAGNOSIS — K5900 Constipation, unspecified: Secondary | ICD-10-CM | POA: Diagnosis not present

## 2015-07-15 DIAGNOSIS — R599 Enlarged lymph nodes, unspecified: Secondary | ICD-10-CM | POA: Diagnosis not present

## 2015-07-15 DIAGNOSIS — K6289 Other specified diseases of anus and rectum: Secondary | ICD-10-CM | POA: Diagnosis not present

## 2015-07-15 DIAGNOSIS — R590 Localized enlarged lymph nodes: Secondary | ICD-10-CM

## 2015-07-15 LAB — POCT URINALYSIS DIPSTICK
Bilirubin, UA: NEGATIVE
Blood, UA: NEGATIVE
Glucose, UA: NEGATIVE
Ketones, UA: NEGATIVE
NITRITE UA: NEGATIVE
Protein, UA: NEGATIVE
Spec Grav, UA: 1.025
UROBILINOGEN UA: NEGATIVE
pH, UA: 6

## 2015-07-15 MED ORDER — SULFAMETHOXAZOLE-TRIMETHOPRIM 800-160 MG PO TABS
1.0000 | ORAL_TABLET | Freq: Two times a day (BID) | ORAL | Status: DC
Start: 2015-07-15 — End: 2015-07-24

## 2015-07-15 NOTE — Patient Instructions (Signed)
  Try Miralax one cap daily in 8 ounces of your favorite fluid for constipation. Let us know if your symptoms are not improving.      Urinary Tract Infection Urinary tract infections (UTIs) can develop anywhere along your urinary tract. Your urinary tract is your body's drainage system for removing wastes and extra water. Your urinary tract includes two kidneys, two ureters, a bladder, and a urethra. Your kidneys are a pair of bean-shaped organs. Each kidney is about the size of your fist. They are located below your ribs, one on each side of your spine. CAUSES Infections are caused by microbes, which are microscopic organisms, including fungi, viruses, and bacteria. These organisms are so small that they can only be seen through a microscope. Bacteria are the microbes that most commonly cause UTIs. SYMPTOMS  Symptoms of UTIs may vary by age and gender of the patient and by the location of the infection. Symptoms in young women typically include a frequent and intense urge to urinate and a painful, burning feeling in the bladder or urethra during urination. Older women and men are more likely to be tired, shaky, and weak and have muscle aches and abdominal pain. A fever may mean the infection is in your kidneys. Other symptoms of a kidney infection include pain in your back or sides below the ribs, nausea, and vomiting. DIAGNOSIS To diagnose a UTI, your caregiver will ask you about your symptoms. Your caregiver also will ask to provide a urine sample. The urine sample will be tested for bacteria and white blood cells. White blood cells are made by your body to help fight infection. TREATMENT  Typically, UTIs can be treated with medication. Because most UTIs are caused by a bacterial infection, they usually can be treated with the use of antibiotics. The choice of antibiotic and length of treatment depend on your symptoms and the type of bacteria causing your infection. HOME CARE INSTRUCTIONS  If you  were prescribed antibiotics, take them exactly as your caregiver instructs you. Finish the medication even if you feel better after you have only taken some of the medication.  Drink enough water and fluids to keep your urine clear or pale yellow.  Avoid caffeine, tea, and carbonated beverages. They tend to irritate your bladder.  Empty your bladder often. Avoid holding urine for long periods of time.  Empty your bladder before and after sexual intercourse.  After a bowel movement, women should cleanse from front to back. Use each tissue only once. SEEK MEDICAL CARE IF:   You have back pain.  You develop a fever.  Your symptoms do not begin to resolve within 3 days. SEEK IMMEDIATE MEDICAL CARE IF:   You have severe back pain or lower abdominal pain.  You develop chills.  You have nausea or vomiting.  You have continued burning or discomfort with urination. MAKE SURE YOU:   Understand these instructions.  Will watch your condition.  Will get help right away if you are not doing well or get worse. Document Released: 09/01/2005 Document Revised: 05/23/2012 Document Reviewed: 12/31/2011 [facility] Patient Information 2015 [place], [place]. This information is not intended to replace advice given to you by your health care provider. Make sure you discuss any questions you have with your health care provider.

## 2015-07-15 NOTE — Progress Notes (Signed)
   Subjective:    Patient ID: Christine Gardner, female    DOB: January 16, 1976, 39 y.o.   MRN: [id number]  HPI Pt here for multiple issues. Her main concern is burning with urination for several days and a 2 week history of low back pain that is non radiating and worse when she lays down. She also states she thinks her back pain may be associated with some constipation issues. She reports her back pain is a dull ache and went away after having a bowel movement 3 days ago but returned yesterday. She reports having chronic issues with hard stool and inconsistent bowel movements. She has tried probiotics, stool softeners, and fiber for this problem without much success. She tried advil 800mg  for 4 days and this did not help with her pain. Also reports itching and irritation to her anal area for past several days.  She also is concerned about a swollen tender lymph node to her left inguinal area. It has been present for 2 weeks and has grown in size. Denies fever, chills, weight loss, fatigue, rashes, or lesions to her skin. Denies n/v/d, vaginal discharge or irritation.   Review of Systems  Review of Systems Constitutional: -fever, -chills, -sweats, -unexpected weight change,-fatigue Gastroenterology: -abdominal pain, -nausea, -vomiting, -diarrhea, +constipation  Hematology: -bleeding or bruising problems Musculoskeletal: -arthralgias, -myalgias, -joint swelling, +low back pain Urology: +dysuria, -difficulty urinating, -hematuria, -urinary frequency, -urgency Rectal: +itching, +irritation        Objective:   Physical Exam  Constitutional: She appears well-developed and well-nourished. No distress.  Abdominal: Soft. She exhibits no distension. There is tenderness in the left lower quadrant.    Musculoskeletal:       Lumbar back: She exhibits pain. She exhibits no tenderness.  Lymphadenopathy:    She has no cervical adenopathy.    She has no axillary adenopathy.       Left: Inguinal adenopathy  present.  Skin: Skin is warm and dry. No rash noted.    No lesions to anal area. Urinalysis positive for leukocytes       Assessment & Plan:  Acute cystitis without hematuria - Plan: sulfamethoxazole-trimethoprim (BACTRIM DS,SEPTRA DS) 800-160 MG per tablet, Urine culture  Dysuria - Plan: POCT urinalysis dipstick  Bilateral low back pain without sciatica - Plan: POCT urinalysis dipstick  Anal irritation  Lymphadenopathy, inguinal  Constipation, unspecified constipation type  Will treat for UTI with antibiotics. Urine sent for culture. Instructed to hydrate and let us know if dysuria is not completely better by completion of medication. Suspect low back pain is related to UTI and will resolve. Recommend softening her stool by continuing stool softeners and using Miralax one cap full daily and then titrating according to the effectiveness of the medication. Warm sitz bath to anal area. This is most likely related to constipation. Mass to left inguinal area appears to be an enlarged lymph node and she will keep an eye on this and let us know if it gets larger or is not resolving in a few days. Dr. Redmond School also examined this area.

## 2015-07-18 ENCOUNTER — Other Ambulatory Visit: Payer: Self-pay | Admitting: Family Medicine

## 2015-07-18 ENCOUNTER — Telehealth: Payer: Self-pay | Admitting: Family Medicine

## 2015-07-18 LAB — URINE CULTURE: Colony Count: >=100000

## 2015-07-18 MED ORDER — AMOXICILLIN 500 MG PO CAPS: 500.0000 mg | ORAL_CAPSULE | Freq: Three times a day (TID) | ORAL | Status: DC

## 2015-07-18 MED ORDER — FLUCONAZOLE 150 MG PO TABS: 150.0000 mg | ORAL_TABLET | Freq: Once | ORAL | Status: DC

## 2015-07-18 NOTE — Telephone Encounter (Signed)
Pt now having symptoms of yeast infection, Pt request Diflucan to be sent to [facility] in [place]

## 2015-07-18 NOTE — Telephone Encounter (Signed)
Urine culture came back and Bactrim DS that was prescribed for UTI is not good coverage so I have e-scribed new prescription Amoxicillin 500 tid x 3 days. Left message on patient cell phone.

## 2015-07-18 NOTE — Telephone Encounter (Signed)
Done

## 2015-07-18 NOTE — Telephone Encounter (Signed)
Please call her in Diflucan 150mg  x 1. Thanks

## 2015-07-21 ENCOUNTER — Other Ambulatory Visit: Payer: Self-pay | Admitting: *Deleted

## 2015-07-21 MED ORDER — FLUCONAZOLE 150 MG PO TABS: 150.0000 mg | ORAL_TABLET | Freq: Once | ORAL | Status: DC

## 2015-07-24 ENCOUNTER — Ambulatory Visit: Payer: BLUE CROSS/BLUE SHIELD | Admitting: Family Medicine

## 2015-07-24 ENCOUNTER — Ambulatory Visit (INDEPENDENT_AMBULATORY_CARE_PROVIDER_SITE_OTHER): Payer: BLUE CROSS/BLUE SHIELD | Admitting: Medical

## 2015-07-24 ENCOUNTER — Encounter: Payer: Self-pay | Admitting: Medical

## 2015-07-24 VITALS — BP 130/90 | HR 80 | Temp 98.6°F | Resp 16 | Wt 173.0 lb

## 2015-07-24 DIAGNOSIS — R319 Hematuria, unspecified: Secondary | ICD-10-CM

## 2015-07-24 DIAGNOSIS — K5909 Other constipation: Secondary | ICD-10-CM | POA: Diagnosis not present

## 2015-07-24 DIAGNOSIS — R109 Unspecified abdominal pain: Secondary | ICD-10-CM

## 2015-07-24 DIAGNOSIS — N3000 Acute cystitis without hematuria: Secondary | ICD-10-CM

## 2015-07-24 LAB — POCT URINALYSIS DIPSTICK
BILIRUBIN UA: NEGATIVE
GLUCOSE UA: NEGATIVE
Ketones, UA: NEGATIVE
Nitrite, UA: NEGATIVE
Protein, UA: NEGATIVE
Spec Grav, UA: <=1.005
Urobilinogen, UA: NEGATIVE
pH, UA: 6

## 2015-07-24 MED ORDER — LINACLOTIDE 290 MCG PO CAPS: 290.0000 ug | ORAL_CAPSULE | Freq: Every day | ORAL | Status: DC

## 2015-07-24 MED ORDER — SULFAMETHOXAZOLE-TRIMETHOPRIM 800-160 MG PO TABS: 1.0000 | ORAL_TABLET | Freq: Two times a day (BID) | ORAL | Status: DC

## 2015-07-24 NOTE — Progress Notes (Signed)
Subjective: Here just last week to see Vickie, NP, for variety of symptoms primarily back pain, abdominal pain, and was found to have UTI.   originally she was on bactrim for 2 days, then switched to Amoxicillin.  She noted almost immediate improvement on Bactrim, and after switching to Amoxicillin seemed ok for a few days, but now all the sytmpos have returned including back pain, major back pain last week, abdominal pain, burning with urination, lymph node on the left inguinal area still inflamed, and still having issues with constipation.   Initially few days ago thought the back pain was sciatica, had massage and used Advil but pain has persisted.  Is in monogamous relationship, no concern for STD.  No vaginal discharge.  No blood in stool.  No other aggravating or relieving factors. No other complaint.  Past Medical History  Diagnosis Date  . Type a blood, rh negative   . Seasonal allergic rhinitis     allergy testing 06/2013 with [facility]  . Migraine   . IUD (intrauterine device) in place 11/2010    Mirena  . Routine gynecological examination     [name], [place]  . H/O mammogram 2012    benign, eval due to right breast lump  . Vitamin D deficiency   . Anaphylactic reaction 7/14    with urticaria; Flagyl  . Hypertension 2016   ROS as in subjective   Objective: BP 130/90 mmHg  Pulse 80  Temp(Src) 98.6 F (37 C)  Resp 16  Wt 173 lb (78.472 kg)  Gen: wd,wn, nad Back nontender Abdomen: +bs, soft, suprapubic and LLQ tenderness, no mass, no organomegaly Legs nontendender, normal ROM 1cm tender inflamed lymph node of left inguinal region GU deferred    Assessment: Encounter Diagnoses  Name Primary?  . Acute cystitis without hematuria Yes  . Hematuria   . Other constipation   . Abdominal pain, unspecified abdominal location     Plan: Use Bactrim she has left over BID x 3-5 days, rest, hydrate well.   Begin Linzess for constipation, c/t healthy amount  of water and fiber daily as discussed Call report Monday on UTI symptoms, and recheck in 3-4 wk on constipation.

## 2015-07-29 ENCOUNTER — Telehealth: Payer: Self-pay | Admitting: Medical

## 2015-07-29 NOTE — Telephone Encounter (Signed)
Pt called and said you wanted her to call u to let u know how she was, She said with the UTI she was feeling better and with the Bowel Movements she has had 2 BM since last Thursday, and is having some pain in her side, but isnt for sure if the pain is coming from not having a BM pt is till taking the medicine for the BM,  pt can be reached at [phone]

## 2015-07-29 NOTE — Telephone Encounter (Signed)
She will c/t Linzess, and UTI symptoms improving.  She will call back in 2wk.

## 2015-08-05 ENCOUNTER — Telehealth: Payer: Self-pay | Admitting: Medical

## 2015-08-05 NOTE — Telephone Encounter (Signed)
Patient said not any bigger no new signs feels better I told her to wait another week or two and if not any smaller than make an appointment pt agreed

## 2015-08-05 NOTE — Telephone Encounter (Signed)
Assuming the urinary symptoms resolved, then it may just take a few weeks for the lymph node to resolve.  As long as the node is not bigger, then give it another week or 2.     If any ongoing or new symptoms, belly discomfort, nausea, vagina symptoms, other urinary symptoms, then we can go further with evaluation if needed.      If feeling fine other than the node still about the same, then lets give it just a little more time .  It was a small inflamed node.

## 2015-08-05 NOTE — Telephone Encounter (Signed)
Pt says lymph node still has not gone down. What should she do or not be doing at this point?

## 2015-08-14 ENCOUNTER — Telehealth: Payer: Self-pay | Admitting: Family Medicine

## 2015-08-14 NOTE — Telephone Encounter (Signed)
Pt called and states she has stopped the Linzest as it is too harsh.  She is using a stool softener that is working.  She is seeing a lot of mucous in her stool every time.  The lymph node is better.  Are there any other test you can do?  Pt ph [phone]

## 2015-08-14 NOTE — Telephone Encounter (Signed)
Glad to hear lymph node is better.   Keep in mind that symptoms of constipation are gassy, bloating, abdominal pain, not having BM every day, hard to pass stool, or hard stool, small pellets of stool.    If you are having other symptoms, particularly lots of bloating, lots of mucous, blood in stool, certain foods teat your stomach up, etc. Then this could be a sign of gluten allergy, or food sensitives, or IBS.   If you think your symptoms reflect the latter, there are some blood tests that can be done, or sometimes we just end up referring to gastroenterology and letting them do an evaluation which can include endoscopy  Specifically, Linzess tends to work well for constipation, there is a low and high dose.   Sometimes the medication works too strong, which may be occuring in her case.     See how she wants to proceed.

## 2015-08-14 NOTE — Telephone Encounter (Signed)
Patient would like to have BW and discuss GI referral. Appt next Friday.

## 2015-08-20 ENCOUNTER — Encounter: Payer: Self-pay | Admitting: Gastroenterology

## 2015-08-20 ENCOUNTER — Ambulatory Visit (INDEPENDENT_AMBULATORY_CARE_PROVIDER_SITE_OTHER): Payer: BLUE CROSS/BLUE SHIELD | Admitting: Medical

## 2015-08-20 ENCOUNTER — Encounter: Payer: Self-pay | Admitting: Medical

## 2015-08-20 VITALS — BP 130/98 | HR 60 | Temp 98.0°F | Resp 18 | Wt 174.6 lb

## 2015-08-20 DIAGNOSIS — R195 Other fecal abnormalities: Secondary | ICD-10-CM | POA: Diagnosis not present

## 2015-08-20 DIAGNOSIS — K5900 Constipation, unspecified: Secondary | ICD-10-CM

## 2015-08-20 LAB — CBC
HCT: 40.9 % (ref 36.0–46.0)
Hemoglobin: 13.5 g/dL (ref 12.0–15.0)
MCH: 32.4 pg (ref 26.0–34.0)
MCHC: 33 g/dL (ref 30.0–36.0)
MCV: 98.1 fL (ref 78.0–100.0)
MPV: 10.2 fL (ref 8.6–12.4)
PLATELETS: 274 10*3/uL (ref 150–400)
RBC: 4.17 MIL/uL (ref 3.87–5.11)
RDW: 13.4 % (ref 11.5–15.5)
WBC: 5.9 10*3/uL (ref 4.0–10.5)

## 2015-08-20 MED ORDER — LUBIPROSTONE 24 MCG PO CAPS: 24.0000 ug | ORAL_CAPSULE | Freq: Two times a day (BID) | ORAL | Status: DC

## 2015-08-20 NOTE — Progress Notes (Signed)
   Subjective: Chief Complaint  Patient presents with  . Follow-up   Here for f/u on abdominal issues.  She reports the last few years having trouble with constipation, but in recent months, unless she takes something regularly, she will have constipation.  She can't seem to have a BM without taking a stool softener or laxative.   After last visit had used the Linzess, but this seemed strong causing excessive loose stool.  Not she is back to baseline with having BM on average once a week and only if she takes something for this.  She denise bloating, hard to pass or dry stool, primarily just having infrequent BMs.  Denies blood in the stool.  She has had a few episodes of mucous in the stool.  Denies urinary issues, weight loss, fever, back pain, and no specific abdominal pain.  No hx/o colon cancer in self or family.  No hx/o GI problems otherwise.  No other aggravating or relieving factors. No other complaint.   Past Medical History  Diagnosis Date  . Type a blood, rh negative   . Seasonal allergic rhinitis     allergy testing 06/2013 with [place]  . Migraine   . IUD (intrauterine device) in place 11/2010    Mirena  . Routine gynecological examination     [name], [place]  . H/O mammogram 2012    benign, eval due to right breast lump  . Vitamin D deficiency   . Anaphylactic reaction 7/14    with urticaria; Flagyl  . Hypertension 2016   ROS as in subjective  Objective BP 130/98 mmHg  Pulse 60  Temp(Src) 98 F (36.7 C) (Oral)  Resp 18  Wt 174 lb 9.6 oz (79.198 kg)  Gen: wd, wn, nad Skin: unremarkable lungs clear Heart RRR, normal s1, s2, no murmurs Abdomen: +bs, soft, nontender, no mass, no organomegaly Back: nontender   Assessment: Encounter Diagnoses  Name Primary?  . Constipation, unspecified constipation type Yes  . Change in stool    Plan: discussed concerns and symptoms.  I suspect functional constipation, but she wants to rule other more  worrisome GI causes.   Recheck CMET and CBC today.  Will refer to GI.  STOP linzess and change to trial of Amitiza.  C/t healthy diet, exercise, fiber and water intake.

## 2015-08-21 LAB — COMPREHENSIVE METABOLIC PANEL
ALBUMIN: 4.4 g/dL (ref 3.6–5.1)
ALT: 13 U/L (ref 6–29)
AST: 17 U/L (ref 10–30)
Alkaline Phosphatase: 63 U/L (ref 33–115)
BILIRUBIN TOTAL: 0.8 mg/dL (ref 0.2–1.2)
BUN: 9 mg/dL (ref 7–25)
CO2: 25 mmol/L (ref 20–31)
CREATININE: 0.71 mg/dL (ref 0.50–1.10)
Calcium: 9.6 mg/dL (ref 8.6–10.2)
Chloride: 105 mmol/L (ref 98–110)
Glucose, Bld: 91 mg/dL (ref 65–99)
Potassium: 4.3 mmol/L (ref 3.5–5.3)
SODIUM: 139 mmol/L (ref 135–146)
TOTAL PROTEIN: 7.7 g/dL (ref 6.1–8.1)

## 2015-08-22 ENCOUNTER — Ambulatory Visit: Payer: Self-pay | Admitting: Medical

## 2015-10-10 ENCOUNTER — Encounter: Payer: Self-pay | Admitting: Gastroenterology

## 2015-10-10 ENCOUNTER — Ambulatory Visit (INDEPENDENT_AMBULATORY_CARE_PROVIDER_SITE_OTHER): Payer: BLUE CROSS/BLUE SHIELD | Admitting: Gastroenterology

## 2015-10-10 VITALS — BP 106/76 | HR 88 | Ht 67.25 in | Wt 176.0 lb

## 2015-10-10 DIAGNOSIS — K5900 Constipation, unspecified: Secondary | ICD-10-CM

## 2015-10-10 DIAGNOSIS — R194 Change in bowel habit: Secondary | ICD-10-CM | POA: Diagnosis not present

## 2015-10-10 MED ORDER — NA SULFATE-K SULFATE-MG SULF 17.5-3.13-1.6 GM/177ML PO SOLN: ORAL | Status: DC

## 2015-10-10 NOTE — Progress Notes (Signed)
HPI :  39 y/o female seen in consultation for constipation from [employer] [facility]. She has a history of migraine HA and HTN.   She reports she previously had normal bowel habits until the past year. More specifically she has had constipation significantly since August, she reported severe constipation. Prior to August she reported normal bowel habits roughly one BM per day or so. She had been taking some OTC stool softeners which previously worked but they stopped working in August during which time she went a few weeks without having a bowel movement and was very uncomfortable. She also had some sorness in her anal canal and was told she had an anal fissure in this setting causing her discomfort.   She was placed on Linzess however she reports it caused diarrhea and was too strong, did not like the way it made her feel. She had diarrhea and did not tolerate it. She has not been using much of anything since that time. She tries to drink a lot of water. She is having one BM every one or two days. She does have some hard stools and some strainnig. She is most concerned that she has had some  "mucous" in the stools which is new for her. She has not had any red blood in the stools. She has used miralax in August which did not help her. She was taking upwards of twice daily. She has tried fiber supplements previously which did not help.   No FH of colon cancer. No prior colonoscopy.      Past Medical History  Diagnosis Date  . Type a blood, rh negative   . Seasonal allergic rhinitis     allergy testing 06/2013 with [place]  . Migraine   . IUD (intrauterine device) in place 11/2010    Mirena  . Routine gynecological examination     [name], [place]  . H/O mammogram 2012    benign, eval due to right breast lump  . Vitamin D deficiency   . Anaphylactic reaction 7/14    with urticaria; Flagyl  . Hypertension 2016     Past Surgical History  Procedure Laterality Date  .  Colposcopy  2013    LGSIL, f/u q44mo as of 6/14  . Toe surgery      hammer toe 4th, 5th, bilat   Family History  Problem Relation Age of Onset  . Hyperlipidemia Mother   . Migraines Mother   . Hypertension Mother   . Diabetes Mother   . Hyperlipidemia Maternal Grandmother   . Diabetes Maternal Grandmother   . Heart disease Father 37    died of MI  . Other Father     quesionable drug abuse  . Hypertension Sister   . Hypertension Brother   . Stroke Neg Hx   . Cancer Neg Hx   . Hypertension Sister    Social History  Substance Use Topics  . Smoking status: Never Smoker   . Smokeless tobacco: Never Used  . Alcohol Use: 1.2 oz/week    2 Glasses of wine per week   Current Outpatient Prescriptions  Medication Sig Dispense Refill  . amLODipine (NORVASC) 5 MG tablet Take 1 tablet (5 mg total) by mouth daily. 90 tablet 3  . levonorgestrel (MIRENA) 20 MCG/24HR IUD 1 each by Intrauterine route once.     No current facility-administered medications for this visit.   Allergies  Allergen Reactions  . Flagyl [Metronidazole] Anaphylaxis  . Vicodin [Hydrocodone-Acetaminophen]  itch  . Percocet [Oxycodone-Acetaminophen] Itching  . Tramadol Itching     Review of Systems: All systems reviewed and negative except where noted in HPI.   Lab Results  Component Value Date   WBC 5.9 08/20/2015   HGB 13.5 08/20/2015   HCT 40.9 08/20/2015   MCV 98.1 08/20/2015   PLT 274 08/20/2015    Lab Results  Component Value Date   ALT 13 08/20/2015   AST 17 08/20/2015   ALKPHOS 63 08/20/2015   BILITOT 0.8 08/20/2015    Lab Results  Component Value Date   CREATININE 0.71 08/20/2015   BUN 9 08/20/2015   NA 139 08/20/2015   K 4.3 08/20/2015   CL 105 08/20/2015   CO2 25 08/20/2015     Physical Exam: BP 106/76 mmHg  Pulse 88  Ht 5' 7.25" (1.708 m)  Wt 176 lb (79.833 kg)  BMI 27.37 kg/m2  LMP 09/20/2015 Constitutional: Pleasant,well-developed, female in no acute  distress. HEENT: Normocephalic and atraumatic. Conjunctivae are normal. No scleral icterus. Neck supple.  Cardiovascular: Normal rate, regular rhythm.  Pulmonary/chest: Effort normal and breath sounds normal. No wheezing, rales or rhonchi. Abdominal: Soft, nondistended, nontender. Bowel sounds active throughout. There are no masses palpable. No hepatomegaly. Extremities: no edema Lymphadenopathy: No cervical adenopathy noted. Neurological: Alert and oriented to person place and time. Skin: Skin is warm and dry. No rashes noted. Psychiatric: Normal mood and affect. Behavior is normal.   ASSESSMENT AND PLAN: 39 y/o female who had regular bowel habits until this past summer when she had acute onset of severe constipation without clear triggers. While she still has some constipation, it is not as severe as it was previously. Trial of Linzess appeared too strong. She is most concerned about "mucous" in her stools. CBC noted to be normal.   I discussed that constipation is common and potential etiologies. However given this rather acute onset with mucous in her stools, I offered her a colonoscopy to ensure no inflammatory changes and rule out polyp / mass lesion, which I suspect is otherwise unlikely. For piece of mind she strongly wanted to proceed with colonoscopy, following our discussion of risks / benefits. In the interim until the exam she can take higher dose miralax (double dose BID TO TID) to see if this helps if Linzess is too strong.   The indications, risks, and benefits of colonoscopy were explained to the patient in detail. Risks include but are not limited to bleeding, perforation, adverse reaction to medications, and cardiopulmonary compromise. Sequelae include but are not limited to the possibility of surgery, hospitalization, and mortality. The patient verbalized understanding and wished to proceed. All questions answered, referred to the scheduler and bowel prep ordered. Further  recommendations pending results of the exam.   [place] Cellar, MD [facility] Pager [phone]    CC: Chana Bode PA

## 2015-10-10 NOTE — Patient Instructions (Signed)

## 2015-10-21 ENCOUNTER — Ambulatory Visit (INDEPENDENT_AMBULATORY_CARE_PROVIDER_SITE_OTHER): Payer: BLUE CROSS/BLUE SHIELD | Admitting: Medical

## 2015-10-21 ENCOUNTER — Encounter: Payer: Self-pay | Admitting: Medical

## 2015-10-21 VITALS — BP 110/80 | HR 97 | Temp 98.2°F | Wt 176.0 lb

## 2015-10-21 DIAGNOSIS — M545 Low back pain: Secondary | ICD-10-CM | POA: Diagnosis not present

## 2015-10-21 DIAGNOSIS — R1031 Right lower quadrant pain: Secondary | ICD-10-CM | POA: Diagnosis not present

## 2015-10-21 DIAGNOSIS — R002 Palpitations: Secondary | ICD-10-CM | POA: Diagnosis not present

## 2015-10-21 DIAGNOSIS — K5909 Other constipation: Secondary | ICD-10-CM

## 2015-10-21 DIAGNOSIS — K5900 Constipation, unspecified: Secondary | ICD-10-CM | POA: Diagnosis not present

## 2015-10-21 LAB — BASIC METABOLIC PANEL
BUN: 9 mg/dL (ref 7–25)
CALCIUM: 9.6 mg/dL (ref 8.6–10.2)
CO2: 25 mmol/L (ref 20–31)
CREATININE: 0.68 mg/dL (ref 0.50–1.10)
Chloride: 102 mmol/L (ref 98–110)
Glucose, Bld: 92 mg/dL (ref 65–99)
Potassium: 4.2 mmol/L (ref 3.5–5.3)
Sodium: 137 mmol/L (ref 135–146)

## 2015-10-21 LAB — POCT URINALYSIS DIPSTICK
BILIRUBIN UA: NEGATIVE
Glucose, UA: NEGATIVE
KETONES UA: NEGATIVE
Leukocytes, UA: NEGATIVE
Nitrite, UA: NEGATIVE
PH UA: 7.5
Protein, UA: NEGATIVE
RBC UA: NEGATIVE
SPEC GRAV UA: 1.015
Urobilinogen, UA: NEGATIVE

## 2015-10-21 LAB — TSH: TSH: 1.557 u[IU]/mL (ref 0.350–4.500)

## 2015-10-21 LAB — T4, FREE: FREE T4: 0.93 ng/dL (ref 0.80–1.80)

## 2015-10-21 NOTE — Progress Notes (Signed)
Subjective: Chief Complaint  Patient presents with  . Back Pain    a couple months ago on her lower right side this happened from a uti wants to rule that out. and also having a dull pain on left side of chest. said for the last week she has also been feeling like her heart is racing and that comes and goes.   Here for c/o right flank/low back pain x 1 week, dull, mild.   Feels similar to when she had UTI a few months ago, although denies any urinary symptom.  No polyuria, frequency, urgency, urine odor, burning with urination.  No fever, no other abdominal discomfort, no nausea, no vomiting, no diarrhea.  She does have chronic constipation, met with GI few weeks ago.   Has colonoscopy planned for 12/2015.   Periods are normal, no hx/o ovarian cyst or other gyn abnormality.  Here for symptoms of heart racing, dull pain in left chest.   She notes for about a week has been having 2-3 episodes daily of palpations or chest discomfort of left chest.  She hasn't actually checked pulse but feels heart racing.  Denies associated SOB, nausea faint feeling, dizziness, edema, sweats.   Felt similarly upon triage this morning.   Only drinks 1/2 cup coffee daily, limits caffeine.  No recent alcohol use.  Sleep is fine, not stressed.  She is compliant with Norvasc for hypertension.  No other aggravating or relieving factors. No other complaint.  Past Medical History  Diagnosis Date  . Type a blood, rh negative   . Seasonal allergic rhinitis     allergy testing 06/2013 with [place]  . Migraine   . IUD (intrauterine device) in place 11/2010    Mirena  . Routine gynecological examination     [name], [place]  . H/O mammogram 2012    benign, eval due to right breast lump  . Vitamin D deficiency   . Anaphylactic reaction 7/14    with urticaria; Flagyl  . Hypertension 2016   ROS as in subjective   Objective: BP 110/80 mmHg  Pulse 97  Temp(Src) 98.2 F (36.8 C) (Oral)  Wt 176 lb  (79.833 kg)  SpO2 94%  LMP 09/20/2015  Of note, initially recorded pulse rate of 158 on pulse oximetry upon initial triage.  Nurse recorded this for less than 30 seconds  General appearance: alert, no distress, WD/WN, calm appearing lying on exam table with EKG electrodes attached Oral cavity: MMM, no lesions Neck: supple, no lymphadenopathy, no thyromegaly, no masses Heart: RRR, normal S1, S2, no murmurs Lungs: CTA bilaterally, no wheezes, rhonchi, or rales Abdomen: +bs, soft, mild RLQ tenderness, otherwise non tender, non distended, no masses, no hepatomegaly, no splenomegaly Back: nontender Legs nontender, normal hip ROM Pulses: 2+ symmetric, upper and lower extremities, normal cap refill   Adult ECG Report  Indication: palpitations  Rate: 73 bpm  Rhythm: normal sinus rhythm  QRS Axis: 50 degrees  PR Interval: 189m  QRS Duration: 883m QTc: 42762mConduction Disturbances: none  Other Abnormalities: none  Patient's cardiac risk factors are: hypertension.  EKG comparison: 05/2015  Narrative Interpretation: no acute change   Assessment: Encounter Diagnoses  Name Primary?  . Palpitations Yes  . RLQ abdominal pain   . Low back pain without sciatica, unspecified back pain laterality   . Chronic constipation     Plan: Palpitations - not sure I trust the initial pulse rate of 158 after speaking with CMA.  So  far monitoring and EKG in office doesn't show any tachycardia other than the brief tachycardia noted on initial triage that was questionable.   given history though, will set up for event/Holter monitor with Dr. Thurman Coyer office.   Labs today. RLQ abdominal pain - unclear etiology, doubt appendicitis, she does have urine pH of 7.5 with odorous urine, so UTI possible in light of the flank pain, possible ovarian cyst or constipation.    Urine culture sent Chronic constipation - c/t efforts at good fiber and water intake.  Per her and GI, not currently taking linzess or  similar

## 2015-10-23 ENCOUNTER — Telehealth: Payer: Self-pay

## 2015-10-23 LAB — URINE CULTURE
COLONY COUNT: NO GROWTH
Organism ID, Bacteria: NO GROWTH

## 2015-10-23 NOTE — Telephone Encounter (Signed)
Pt has no number on file, I called her work and left a message for her. Did she happen to leave a phone number when she came in for her visit?

## 2015-10-23 NOTE — Telephone Encounter (Signed)
Pt's phone number is on her desk top. It is [phone]

## 2015-12-07 HISTORY — PX: COLONOSCOPY: SHX174

## 2015-12-07 LAB — HM COLONOSCOPY

## 2015-12-15 ENCOUNTER — Encounter: Payer: BLUE CROSS/BLUE SHIELD | Admitting: Gastroenterology

## 2015-12-22 ENCOUNTER — Encounter: Payer: Self-pay | Admitting: Medical

## 2015-12-29 ENCOUNTER — Ambulatory Visit (AMBULATORY_SURGERY_CENTER): Payer: Self-pay | Admitting: Gastroenterology

## 2015-12-29 ENCOUNTER — Encounter: Payer: Self-pay | Admitting: Gastroenterology

## 2015-12-29 VITALS — BP 108/75 | HR 80 | Temp 98.1°F | Resp 13 | Ht 67.0 in | Wt 176.0 lb

## 2015-12-29 DIAGNOSIS — D125 Benign neoplasm of sigmoid colon: Secondary | ICD-10-CM

## 2015-12-29 DIAGNOSIS — K635 Polyp of colon: Secondary | ICD-10-CM

## 2015-12-29 DIAGNOSIS — K5900 Constipation, unspecified: Secondary | ICD-10-CM

## 2015-12-29 MED ORDER — SODIUM CHLORIDE 0.9 % IV SOLN: 500.0000 mL | INTRAVENOUS | Status: DC

## 2015-12-29 NOTE — Op Note (Signed)
[facility]  [address] [place], [address]   COLONOSCOPY PROCEDURE REPORT  PATIENT: Christine Gardner, Christine Gardner  MR#: [id number] BIRTHDATE: [birth year] , 39  yrs. old GENDER: female ENDOSCOPIST: Yetta Flock, MD REFERRED BY: PROCEDURE DATE:  12/29/2015 PROCEDURE:   Colonoscopy, diagnostic and Colonoscopy with biopsy  ASA CLASS:   Class II INDICATIONS:change in bowel habits and Colorectal Neoplasm Risk Assessment for this procedure is average risk. MEDICATIONS: Propofol 250 mg IV  DESCRIPTION OF PROCEDURE:   After the risks benefits and alternatives of the procedure were thoroughly explained, informed consent was obtained.  The digital rectal exam revealed no abnormalities of the rectum.   The LB [id number]  endoscope was introduced through the anus and advanced to the terminal ileum which was intubated for a short distance. No adverse events experienced.   The quality of the prep was good.  The instrument was then slowly withdrawn as the colon was fully examined. Estimated blood loss is zero unless otherwise noted in this procedure report.    COLON FINDINGS: There was a 40mm sessile polyp in the sigmoid colon, grossly consistent with a benign hyperplastic polyp, but was removed with cold forceps.  The remainder of the examined colon was normal without polyps or inflammatory changes.  The ileum was intubated and was normal.  Retroflexion was not performed due to a narrow rectal vault. The time to cecum = 3.3 Withdrawal time = 11.4 The scope was withdrawn and the procedure completed. COMPLICATIONS: There were no immediate complications.  ENDOSCOPIC IMPRESSION: Small sigmoid polyp, removed Normal remainder of examined colon Normal ileum Narrow rectal vault, retroflexion not performed  RECOMMENDATIONS: Await pathology results Resume diet Resume medications  eSigned:  Yetta Flock, MD 2015-12-29 [phone]   cc:  the patient

## 2015-12-29 NOTE — Progress Notes (Signed)
Called to room to assist during endoscopic procedure.  Patient ID and intended procedure confirmed with present staff. Received instructions for my participation in the procedure from the performing physician.  

## 2015-12-29 NOTE — Progress Notes (Signed)
A/ox3 pleased with MAC, report to Karen RN 

## 2015-12-29 NOTE — Patient Instructions (Signed)
HANDOUTS GIVEN:pOLYPS.  RESUME YOUR MEDICATIONS AND DIET.  YOU HAD AN ENDOSCOPIC PROCEDURE TODAY AT [facility]:   Refer to the procedure report that was given to you for any specific questions about what was found during the examination.  If the procedure report does not answer your questions, please call your gastroenterologist to clarify.  If you requested that your care partner not be given the details of your procedure findings, then the procedure report has been included in a sealed envelope for you to review at your convenience later.  YOU SHOULD EXPECT: Some feelings of bloating in the abdomen. Passage of more gas than usual.  Walking can help get rid of the air that was put into your GI tract during the procedure and reduce the bloating. If you had a lower endoscopy (such as a colonoscopy or flexible sigmoidoscopy) you may notice spotting of blood in your stool or on the toilet paper. If you underwent a bowel prep for your procedure, you may not have a normal bowel movement for a few days.  Please Note:  You might notice some irritation and congestion in your nose or some drainage.  This is from the oxygen used during your procedure.  There is no need for concern and it should clear up in a day or so.  SYMPTOMS TO REPORT IMMEDIATELY:   Following lower endoscopy (colonoscopy or flexible sigmoidoscopy):  Excessive amounts of blood in the stool  Significant tenderness or worsening of abdominal pains  Swelling of the abdomen that is new, acute  Fever of 100F or higher   For urgent or emergent issues, a gastroenterologist can be reached at any hour by calling [phone].   DIET: Your first meal following the procedure should be a small meal and then it is ok to progress to your normal diet. Heavy or fried foods are harder to digest and may make you feel nauseous or bloated.  Likewise, meals heavy in dairy and vegetables can increase bloating.  Drink plenty of fluids  but you should avoid alcoholic beverages for 24 hours.  ACTIVITY:  You should plan to take it easy for the rest of today and you should NOT DRIVE or use heavy machinery until tomorrow (because of the sedation medicines used during the test).    FOLLOW UP: Our staff will call the number listed on your records the next business day following your procedure to check on you and address any questions or concerns that you may have regarding the information given to you following your procedure. If we do not reach you, we will leave a message.  However, if you are feeling well and you are not experiencing any problems, there is no need to return our call.  We will assume that you have returned to your regular daily activities without incident.  If any biopsies were taken you will be contacted by phone or by letter within the next 1-3 weeks.  Please call us at [phone] if you have not heard about the biopsies in 3 weeks.    SIGNATURES/CONFIDENTIALITY: You and/or your care partner have signed paperwork which will be entered into your electronic medical record.  These signatures attest to the fact that that the information above on your After Visit Summary has been reviewed and is understood.  Full responsibility of the confidentiality of this discharge information lies with you and/or your care-partner.

## 2015-12-30 ENCOUNTER — Telehealth: Payer: Self-pay

## 2015-12-30 NOTE — Telephone Encounter (Signed)
  Follow up Call-  Call back number 12/29/2015  Post procedure Call Back phone  # [phone]  Permission to leave phone message Yes     Patient questions:  Do you have a fever, pain , or abdominal swelling? No. Pain Score  0 *  Have you tolerated food without any problems? Yes.    Have you been able to return to your normal activities? Yes.    Do you have any questions about your discharge instructions: Diet   No. Medications  No. Follow up visit  No.  Do you have questions or concerns about your Care? No.  Actions: * If pain score is 4 or above: No action needed, pain <4.

## 2016-01-02 ENCOUNTER — Encounter: Payer: Self-pay | Admitting: Gastroenterology

## 2016-04-23 ENCOUNTER — Encounter: Payer: Self-pay | Admitting: Family Medicine

## 2016-04-23 ENCOUNTER — Ambulatory Visit (INDEPENDENT_AMBULATORY_CARE_PROVIDER_SITE_OTHER): Payer: BLUE CROSS/BLUE SHIELD | Admitting: Family Medicine

## 2016-04-23 VITALS — BP 122/82 | HR 86 | Wt 176.0 lb

## 2016-04-23 DIAGNOSIS — H6981 Other specified disorders of Eustachian tube, right ear: Secondary | ICD-10-CM | POA: Diagnosis not present

## 2016-04-23 NOTE — Patient Instructions (Signed)
Use either Sudafed or short-term course of Afrin nasal spray

## 2016-04-23 NOTE — Progress Notes (Signed)
   Subjective:    Patient ID: Terance Ice, female    DOB: [birth year], 40 y.o.   MRN: [id number]  HPI  approximately 2 weeks ago she had difficulty with sore throat, dry cough that became productive with some nasal congestion and a right ear congestion. It is made worse with swallowing. She is feeling better except for the ear congestion.   Review of Systems     Objective:   Physical Exam Alert and in no distress. Tympanic membranes and canals are normal. Pharyngeal area is normal. Neck is supple without adenopathy or thyromegaly. Cardiac exam shows a regular sinus rhythm without murmurs or gallops. Lungs are clear to auscultation.        Assessment & Plan:  Eustachian tube dysfunction, right  she is doing well except for the  Eustachian tube dysfunction. Recommend either Sudafed or short-term use of Afrin nasal spray. She is comfortable with this.

## 2016-04-29 DIAGNOSIS — M7712 Lateral epicondylitis, left elbow: Secondary | ICD-10-CM | POA: Diagnosis not present

## 2016-06-18 ENCOUNTER — Encounter: Payer: BLUE CROSS/BLUE SHIELD | Admitting: Medical

## 2016-06-19 ENCOUNTER — Other Ambulatory Visit: Payer: Self-pay | Admitting: Family Medicine

## 2016-06-19 ENCOUNTER — Ambulatory Visit (HOSPITAL_COMMUNITY)
Admission: EM | Admit: 2016-06-19 | Discharge: 2016-06-19 | Disposition: A | Discharge status: Home / Self Care | Payer: BLUE CROSS/BLUE SHIELD | Attending: Emergency Medicine | Admitting: Emergency Medicine

## 2016-06-19 ENCOUNTER — Encounter (HOSPITAL_COMMUNITY): Payer: Self-pay | Admitting: Emergency Medicine

## 2016-06-19 DIAGNOSIS — G43019 Migraine without aura, intractable, without status migrainosus: Secondary | ICD-10-CM | POA: Diagnosis not present

## 2016-06-19 MED ORDER — DIPHENHYDRAMINE HCL 50 MG/ML IJ SOLN
12.5000 mg | Freq: Once | INTRAMUSCULAR | Status: AC
  Administered 2016-06-19: 12.5 mg via INTRAMUSCULAR

## 2016-06-19 MED ORDER — SUMATRIPTAN SUCCINATE 100 MG PO TABS: 100.0000 mg | ORAL_TABLET | ORAL | Status: DC | PRN

## 2016-06-19 MED ORDER — DEXAMETHASONE SODIUM PHOSPHATE 10 MG/ML IJ SOLN
INTRAMUSCULAR | Status: AC
  Filled 2016-06-19: qty 1

## 2016-06-19 MED ORDER — KETOROLAC TROMETHAMINE 60 MG/2ML IM SOLN
INTRAMUSCULAR | Status: AC
  Filled 2016-06-19: qty 2

## 2016-06-19 MED ORDER — DEXAMETHASONE SODIUM PHOSPHATE 10 MG/ML IJ SOLN
10.0000 mg | Freq: Once | INTRAMUSCULAR | Status: AC
  Administered 2016-06-19: 10 mg via INTRAMUSCULAR

## 2016-06-19 MED ORDER — METOCLOPRAMIDE HCL 5 MG/ML IJ SOLN
5.0000 mg | Freq: Once | INTRAMUSCULAR | Status: AC
  Administered 2016-06-19: 5 mg via INTRAMUSCULAR

## 2016-06-19 MED ORDER — METOCLOPRAMIDE HCL 5 MG/ML IJ SOLN
INTRAMUSCULAR | Status: AC
  Filled 2016-06-19: qty 2

## 2016-06-19 MED ORDER — DIPHENHYDRAMINE HCL 50 MG/ML IJ SOLN
INTRAMUSCULAR | Status: AC
  Filled 2016-06-19: qty 1

## 2016-06-19 MED ORDER — DIPHENHYDRAMINE HCL 50 MG/ML IJ SOLN: 12.5000 mg | Freq: Once | INTRAMUSCULAR | Status: DC

## 2016-06-19 MED ORDER — KETOROLAC TROMETHAMINE 60 MG/2ML IM SOLN
60.0000 mg | Freq: Once | INTRAMUSCULAR | Status: AC
  Administered 2016-06-19: 60 mg via INTRAMUSCULAR

## 2016-06-19 NOTE — ED Provider Notes (Signed)
CSN: [id number]     Arrival date & time 06/19/16  1320 History   First MD Initiated Contact with Patient 06/19/16 1419     Chief Complaint  Patient presents with  . Headache   (Consider location/radiation/quality/duration/timing/severity/associated sxs/prior Treatment) Patient is a 40 y.o. female presenting with headaches. The history is provided by the patient (states that headache started yesterday morning and progressively got worse throughout the day.  states that it is the worst headache that she's ever had.  previous hx of migraines.  not followed by a neurologist.  has never had a HA this severe. ).  Headache Pain location:  Generalized Quality:  Sharp Severity currently:  10/10 Severity at highest:  10/10 Onset quality:  Gradual Duration:  1 day Timing:  Intermittent Progression:  Waxing and waning Chronicity:  New Similar to prior headaches: no   Context: bright light and loud noise   Worsened by:  Light and sound Ineffective treatments:  NSAIDs (imitrex that was called in by PCP today) Associated symptoms: nausea, neck stiffness, photophobia and weakness   Associated symptoms: no blurred vision, no cough, no ear pain, no fever, no hearing loss, no loss of balance, no numbness, no paresthesias, no seizures, no sinus pressure and no sore throat     Past Medical History  Diagnosis Date  . Type a blood, rh negative   . Seasonal allergic rhinitis     allergy testing 06/2013 with [facility]  . Migraine   . IUD (intrauterine device) in place 11/2010    Mirena  . Routine gynecological examination     [name], [place]  . H/O mammogram 2012    benign, eval due to right breast lump  . Vitamin D deficiency   . Anaphylactic reaction 7/14    with urticaria; Flagyl  . Hypertension 2016   Past Surgical History  Procedure Laterality Date  . Colposcopy  2013    LGSIL, f/u q70mo as of 6/14  . Toe surgery      hammer toe 4th, 5th, bilat   Family History   Problem Relation Age of Onset  . Hyperlipidemia Mother   . Migraines Mother   . Hypertension Mother   . Diabetes Mother   . Hyperlipidemia Maternal Grandmother   . Diabetes Maternal Grandmother   . Heart disease Father 38    died of MI  . Other Father     quesionable drug abuse  . Hypertension Sister   . Hypertension Brother   . Stroke Neg Hx   . Cancer Neg Hx   . Colon cancer Neg Hx   . Hypertension Sister    Social History  Substance Use Topics  . Smoking status: Never Smoker   . Smokeless tobacco: Never Used  . Alcohol Use: 1.2 oz/week    2 Glasses of wine per week   OB History    Gravida Para Term Preterm AB TAB SAB Ectopic Multiple Living   4 3 3  1  1   3      Review of Systems  Constitutional: Negative for fever.  HENT: Negative for ear pain, hearing loss, sinus pressure and sore throat.   Eyes: Positive for photophobia. Negative for blurred vision.  Respiratory: Negative for cough.   Gastrointestinal: Positive for nausea.  Musculoskeletal: Positive for neck stiffness.  Neurological: Positive for weakness and headaches. Negative for seizures, numbness, paresthesias and loss of balance.    Allergies  Flagyl; Vicodin; Percocet; and Tramadol  Home Medications   Prior  to Admission medications   Medication Sig Start Date End Date Taking? Authorizing Provider  amLODipine (NORVASC) 5 MG tablet Take 1 tablet (5 mg total) by mouth daily. 06/11/15  Yes Camelia Eng Tysinger, PA-C  BIOTIN PO Take 10,000 mcg by mouth daily.   Yes Historical Provider, MD  fexofenadine (ALLEGRA) 180 MG tablet Take 180 mg by mouth daily.   Yes Historical Provider, MD  levonorgestrel (MIRENA) 20 MCG/24HR IUD 1 each by Intrauterine route once.   Yes Historical Provider, MD  SUMAtriptan (IMITREX) 100 MG tablet Take 1 tablet (100 mg total) by mouth every 2 (two) hours as needed for migraine. May repeat in 2 hours if headache persists or recurs. 06/19/16  Yes Denita Lung, MD   Meds Ordered and  Administered this Visit   Medications  ketorolac (TORADOL) injection 60 mg (60 mg Intramuscular Given 06/19/16 1548)  metoCLOPramide (REGLAN) injection 5 mg (5 mg Intramuscular Given 06/19/16 1548)  dexamethasone (DECADRON) injection 10 mg (10 mg Intramuscular Given 06/19/16 1548)  diphenhydrAMINE (BENADRYL) injection 12.5 mg (12.5 mg Intramuscular Given 06/19/16 1558)    BP 152/91 mmHg  Pulse 102  Temp(Src) 98.9 F (37.2 C) (Oral)  Resp 18  SpO2 100%  LMP 06/15/2016 No data found.   Physical Exam  Constitutional: She is oriented to person, place, and time. She appears well-developed. She appears distressed.  HENT:  Head: Normocephalic and atraumatic.  Eyes: EOM are normal. Pupils are equal, round, and reactive to light. Right eye exhibits no discharge. Left eye exhibits no discharge.  Neck: Normal range of motion.  Cardiovascular: Regular rhythm.   No murmur heard. Pulmonary/Chest: Effort normal. No respiratory distress. She has no wheezes.  Abdominal: She exhibits no distension.  Musculoskeletal: Normal range of motion.  Neurological: She is alert and oriented to person, place, and time. No cranial nerve deficit.  Skin: Skin is warm and dry.  Psychiatric: Her behavior is normal.    ED Course  Procedures (including critical care time)  Labs Review Labs Reviewed - No data to display  Imaging Review No results found.   Visual Acuity Review  Right Eye Distance:   Left Eye Distance:   Bilateral Distance:    Right Eye Near:   Left Eye Near:    Bilateral Near:         MDM   1. Intractable migraine without aura and without status migrainosus    Patient given a migraine cocktail.  D/c instructions given.  Will go to the ED if not getting better or headache worsens.  Will also schedule return office visit with PCP Dr Redmond School.  May need neurology consult at some point.  All questions answered.      Lanae Crumbly, PA-C 06/19/16 [phone]

## 2016-06-19 NOTE — ED Notes (Signed)
Pt c/o constant HA w/intermittent sharp pains onset yest am when she woke up Also c/o bilateral leg weakness  Reports HA increases w/bright light PCP called in imitrex... She took one at [facility] and 1150 w/no relief A&O x4... NAD

## 2016-06-19 NOTE — Discharge Instructions (Signed)

## 2016-07-20 ENCOUNTER — Encounter: Payer: Self-pay | Admitting: Medical

## 2016-07-20 ENCOUNTER — Ambulatory Visit (INDEPENDENT_AMBULATORY_CARE_PROVIDER_SITE_OTHER): Payer: BLUE CROSS/BLUE SHIELD | Admitting: Medical

## 2016-07-20 VITALS — BP 104/72 | HR 80 | Ht 67.5 in | Wt 176.0 lb

## 2016-07-20 DIAGNOSIS — K5900 Constipation, unspecified: Secondary | ICD-10-CM | POA: Diagnosis not present

## 2016-07-20 DIAGNOSIS — I10 Essential (primary) hypertension: Secondary | ICD-10-CM | POA: Diagnosis not present

## 2016-07-20 DIAGNOSIS — R51 Headache: Secondary | ICD-10-CM

## 2016-07-20 DIAGNOSIS — J309 Allergic rhinitis, unspecified: Secondary | ICD-10-CM

## 2016-07-20 DIAGNOSIS — Z2821 Immunization not carried out because of patient refusal: Secondary | ICD-10-CM | POA: Insufficient documentation

## 2016-07-20 DIAGNOSIS — Z975 Presence of (intrauterine) contraceptive device: Secondary | ICD-10-CM | POA: Diagnosis not present

## 2016-07-20 DIAGNOSIS — E559 Vitamin D deficiency, unspecified: Secondary | ICD-10-CM | POA: Diagnosis not present

## 2016-07-20 DIAGNOSIS — R519 Headache, unspecified: Secondary | ICD-10-CM | POA: Insufficient documentation

## 2016-07-20 DIAGNOSIS — Z0000 Encounter for general adult medical examination without abnormal findings: Secondary | ICD-10-CM | POA: Insufficient documentation

## 2016-07-20 DIAGNOSIS — R42 Dizziness and giddiness: Secondary | ICD-10-CM

## 2016-07-20 LAB — POCT URINALYSIS DIPSTICK
BILIRUBIN UA: NEGATIVE
Glucose, UA: NEGATIVE
KETONES UA: NEGATIVE
Leukocytes, UA: NEGATIVE
Nitrite, UA: NEGATIVE
PH UA: 6
Protein, UA: NEGATIVE
RBC UA: NEGATIVE
Spec Grav, UA: 1.025
Urobilinogen, UA: NEGATIVE

## 2016-07-20 LAB — COMPREHENSIVE METABOLIC PANEL
ALK PHOS: 74 U/L (ref 33–115)
ALT: 8 U/L (ref 6–29)
AST: 12 U/L (ref 10–30)
Albumin: 4.2 g/dL (ref 3.6–5.1)
BILIRUBIN TOTAL: 0.9 mg/dL (ref 0.2–1.2)
BUN: 9 mg/dL (ref 7–25)
CALCIUM: 9.4 mg/dL (ref 8.6–10.2)
CO2: 26 mmol/L (ref 20–31)
Chloride: 105 mmol/L (ref 98–110)
Creat: 0.72 mg/dL (ref 0.50–1.10)
GLUCOSE: 95 mg/dL (ref 65–99)
Potassium: 4.2 mmol/L (ref 3.5–5.3)
Sodium: 138 mmol/L (ref 135–146)
Total Protein: 7.4 g/dL (ref 6.1–8.1)

## 2016-07-20 LAB — CBC
HCT: 40 % (ref 35.0–45.0)
Hemoglobin: 13.2 g/dL (ref 11.7–15.5)
MCH: 32 pg (ref 27.0–33.0)
MCHC: 33 g/dL (ref 32.0–36.0)
MCV: 96.9 fL (ref 80.0–100.0)
MPV: 10.9 fL (ref 7.5–12.5)
PLATELETS: 266 10*3/uL (ref 140–400)
RBC: 4.13 MIL/uL (ref 3.80–5.10)
RDW: 13.3 % (ref 11.0–15.0)
WBC: 6.5 10*3/uL (ref 4.0–10.5)

## 2016-07-20 MED ORDER — AMLODIPINE BESYLATE 5 MG PO TABS: 5.0000 mg | ORAL_TABLET | Freq: Every day | ORAL | 3 refills | Status: DC

## 2016-07-20 NOTE — Progress Notes (Signed)
Subjective:   HPI  Christine Gardner is a 40 y.o. female who presents for a complete physical.  Preventative care: [facility] Dr. Joni Reining, dentist [place], [place], [place] here for primary care  Concerns: HTN - compliant with amlodipine 5mg  daily.   Not cheking BPs.  Migraines and dizziness  - had recent migraine, saw urgent care 06/19/16 for this that after 3 days resolved with medication cocktail at urgent care.  However since then having some little or mild headaches on and off.  Her mother thinks its related to stress given more busy at work, daughter moved out.  Also during the last few weeks having some dizziness.  Denies vertigo feelings, but has had brief episodes lasting seconds of lightheaded.  No paresthesias, no vision or hearing loss, no weakness.  No other aggravating or relieving factors. No other complaint.   Reviewed their medical, surgical, family, social, medication, and allergy history and updated chart as appropriate.  Past Medical History:  Diagnosis Date  . 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 Constipation   . H/O mammogram 2012   benign, eval due to right breast lump  . Hypertension 2016  . IUD (intrauterine device) in place 05/2015   Mirena  . Migraine   . Routine gynecological examination    [name], [place]  . Seasonal allergic rhinitis    allergy testing 06/2013 with [facility]  . Type a blood, rh negative   . Vitamin D deficiency     Past Surgical History:  Procedure Laterality Date  . COLONOSCOPY  12/2015   polyp.   [facility], Dr. Doristine Devoid  . COLPOSCOPY  2013   LGSIL, f/u q53mo as of 6/14  . TOE SURGERY     hammer toe 4th, 5th, bilat    Social History   Social History  . Marital status: Married    Spouse name: N/A  . Number of children: N/A  . Years of education: N/A   Occupational History  . Not on file.   Social History Main Topics  . Smoking status: Never  Smoker  . Smokeless tobacco: Never Used  . Alcohol use 1.2 oz/week    2 Glasses of wine per week  . Drug use: No  . Sexual activity: Yes    Birth control/ protection: IUD     Comment: mirena /vas    Other Topics Concern  . Not on file   Social History Narrative   Divorced, boyfriend.   21yo, 19yo, 14yo children.  Exercise 3-4 days per week with treadmill, elliptical, weights.  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  [employer].  07/2016.    Family History  Problem Relation Age of Onset  . Hyperlipidemia Mother   . Migraines Mother   . Hypertension Mother   . Diabetes Mother   . Heart disease Father 59    died of MI  . Other Father     quesionable drug abuse  . Hypertension Sister   . Hypertension Brother   . Hypertension Sister   . Hyperlipidemia Maternal Grandmother   . Diabetes Maternal Grandmother   . Stroke Neg Hx   . Cancer Neg Hx   . Colon cancer Neg Hx      Current Outpatient Prescriptions:  .  amLODipine (NORVASC) 5 MG tablet, Take 1 tablet (5 mg total) by mouth daily., Disp: 90 tablet, Rfl: 3 .  BIOTIN PO, Take 10,000 mcg by mouth daily., Disp: ,  Rfl:  .  fexofenadine (ALLEGRA) 180 MG tablet, Take 180 mg by mouth daily., Disp: , Rfl:  .  levonorgestrel (MIRENA) 20 MCG/24HR IUD, 1 each by Intrauterine route once., Disp: , Rfl:   Allergies  Allergen Reactions  . Flagyl [Metronidazole] Anaphylaxis  . Vicodin [Hydrocodone-Acetaminophen]     itch  . Percocet [Oxycodone-Acetaminophen] Itching  . Tramadol Itching   Review of Systems Constitutional: -fever, -chills, -sweats, -unexpected weight change, -decreased appetite, -fatigue Allergy: -sneezing, -itching, -congestion Dermatology: -changing moles, --rash, -lumps ENT: -runny nose, -ear pain, -sore throat, -hoarseness, -sinus pain, -teeth pain, - ringing in ears, -hearing loss, -nosebleeds Cardiology: -chest pain, -palpitations, -swelling, -difficulty breathing when lying flat, -waking up short of  breath Respiratory: -cough, -shortness of breath, -difficulty breathing with exercise or exertion, -wheezing, -coughing up blood Gastroenterology: -abdominal pain, -nausea, -vomiting, -diarrhea, -constipation, -blood in stool, -changes in bowel movement, -difficulty swallowing or eating Hematology: -bleeding, -bruising  Musculoskeletal: -joint aches, -muscle aches, -joint swelling, -back pain, -neck pain, -cramping, -changes in gait Ophthalmology: denies vision changes, eye redness, itching, discharge Urology: -burning with urination, -difficulty urinating, -blood in urine, -urinary frequency, -urgency, -incontinence Neurology: +headache, -weakness, -tingling, -numbness, -memory loss, -falls, +dizziness Psychology: -depressed mood, -agitation, -sleep problems     Objective:   Physical Exam  BP 104/72   Pulse 80   Ht 5' 7.5" (1.715 m)   Wt 176 lb (79.8 kg)   LMP 07/13/2016   BMI 27.16 kg/m   BP Readings from Last 3 Encounters:  07/20/16 104/72  06/19/16 152/91  04/23/16 122/82   General appearance: alert, no distress, WD/WN, pleasant AA female  Skin: tattoo right lower flank/abdomen, tattoo upper back, tattoo lower half of back, no worrisome lesions  HEENT: normocephalic, conjunctiva/corneas normal, sclerae anicteric, PERRLA, EOMi, nares patent, no discharge or erythema, pharynx normal Oral cavity: MMM, tongue normal, teeth in good repair  Neck: supple, no lymphadenopathy, no thyromegaly, no masses, normal ROM  Heart: RRR, normal S1, S2, no murmurs  Lungs: CTA bilaterally, no wheezes, rhonchi, or rales  Abdomen: +bs, soft, non tender, non distended, no masses, no hepatomegaly, no splenomegaly, no bruits  Back: non tender, normal ROM, no scoliosis  Musculoskeletal: upper extremities non tender, no obvious deformity, normal ROM throughout, lower extremities non tender, no obvious deformity, normal ROM throughout  Extremities: no edema, no cyanosis, no clubbing  Pulses: 2+  symmetric, upper and lower extremities, normal cap refill  Neurological: alert, oriented x 3, CN2-12 intact, strength normal upper extremities and lower extremities, sensation normal throughout, DTRs 2+ throughout, no cerebellar signs, gait normal  Psychiatric: normal affect, behavior normal, pleasant  Breast/gyn/rectal - deferred to gynecology   Assessment and Plan :    Encounter Diagnoses  Name Primary?  . Encounter for health maintenance examination in adult Yes  . Essential hypertension   . Constipation, unspecified constipation type   . Allergic rhinitis, unspecified allergic rhinitis type   . Vitamin D deficiency   . IUD (intrauterine device) in place   . Influenza vaccination declined   . Lightheadedness   . Acute nonintractable headache, unspecified headache type   . Adult general medical exam     Physical exam - discussed healthy lifestyle, diet, exercise, preventative care, vaccinations, and addressed their concerns.   HTN - monitor BP for now in case she is running too low.    This could be contributing to dizziness.  For now c/t Amlodipine 5mg  daily  Headaches - advised headache diary, stress reduction, get back on program  of exercise Chronic constipation - no major issues since she cut out whey protein in her smoothies See your eye doctor yearly for routine vision care. See your dentist yearly for routine dental care including hygiene visits twice yearly. See your gynecologist yearly for routine gynecological care.   Follow-up pending labs  Videl was seen today for annual exam.  Diagnoses and all orders for this visit:  Encounter for health maintenance examination in adult -     Comprehensive metabolic panel -     CBC -     VITAMIN D 25 Hydroxy (Vit-D Deficiency, Fractures) -     POCT urinalysis dipstick  Essential hypertension -     amLODipine (NORVASC) 5 MG tablet; Take 1 tablet (5 mg total) by mouth daily.  Constipation, unspecified constipation  type  Allergic rhinitis, unspecified allergic rhinitis type  Vitamin D deficiency -     VITAMIN D 25 Hydroxy (Vit-D Deficiency, Fractures)  IUD (intrauterine device) in place  Influenza vaccination declined  Lightheadedness  Acute nonintractable headache, unspecified headache type  Adult general medical exam -     amLODipine (NORVASC) 5 MG tablet; Take 1 tablet (5 mg total) by mouth daily.

## 2016-07-21 ENCOUNTER — Other Ambulatory Visit: Payer: Self-pay | Admitting: Medical

## 2016-07-21 LAB — VITAMIN D 25 HYDROXY (VIT D DEFICIENCY, FRACTURES): VIT D 25 HYDROXY: 15 ng/mL — AB (ref 30–100)

## 2016-07-21 MED ORDER — VITAMIN D (ERGOCALCIFEROL) 1.25 MG (50000 UNIT) PO CAPS: 50000.0000 [IU] | ORAL_CAPSULE | ORAL | 3 refills | Status: DC

## 2016-07-22 ENCOUNTER — Telehealth: Payer: Self-pay

## 2016-07-22 NOTE — Telephone Encounter (Signed)
Pt states she missed a phone call from you and was returning your call. You were not available. Please call me back. Christine Gardner

## 2016-08-11 ENCOUNTER — Telehealth: Payer: Self-pay | Admitting: Medical

## 2016-08-11 NOTE — Telephone Encounter (Signed)
Pt called with update on bp.  08/29 bp 117/84 no medication 08/30 bp 122/85 no medication  both  bp taken in the evening  08/31  125/88 evening took 1/2 tab of med 09/01  127/78 evening took 1/2 tab  09/02 120/79 took at 1:00 pm took med 09/03 128/83 took at 1:00 pm took med  09/04 111/82 took at 10 am took med  09/05 125/84 took in evening took med  09/06 124/86 took 7:30 AM took med the night before.  All days medication was taken she took at night before bed.   Please call pt and advise at [phone].

## 2016-08-11 NOTE — Telephone Encounter (Signed)
These numbers all look good.   Is she still having the headaches or dizziness?

## 2016-08-12 NOTE — Telephone Encounter (Signed)
Pt reports that dizziness has improved, still has nagging headache. The headache is nothing that hinders her ADLs. She reports that she is taking BP meds at night and only taking 1/2 tablet. She wonders if she needs to increase back to the whole tablet at night, or keep the 1/2 tablet daily. Victorino December

## 2016-08-17 ENCOUNTER — Telehealth: Payer: Self-pay | Admitting: Medical

## 2016-08-18 NOTE — Telephone Encounter (Signed)
Pt has an appt tomorrow to discuss all this

## 2016-08-18 NOTE — Telephone Encounter (Signed)
See how BPs have been running the last few days.  Is she still just doing 1/2 tablet daily?   Still having dizziness and headaches even at 1/2 tablet daily?

## 2016-08-18 NOTE — Telephone Encounter (Signed)
Left message for pt to call me back 

## 2016-08-19 ENCOUNTER — Ambulatory Visit (INDEPENDENT_AMBULATORY_CARE_PROVIDER_SITE_OTHER): Payer: BLUE CROSS/BLUE SHIELD | Admitting: Medical

## 2016-08-19 ENCOUNTER — Encounter: Payer: Self-pay | Admitting: Medical

## 2016-08-19 VITALS — BP 128/90 | HR 124 | Resp 10 | Ht 67.0 in | Wt 177.6 lb

## 2016-08-19 DIAGNOSIS — Z889 Allergy status to unspecified drugs, medicaments and biological substances status: Secondary | ICD-10-CM

## 2016-08-19 DIAGNOSIS — R42 Dizziness and giddiness: Secondary | ICD-10-CM | POA: Insufficient documentation

## 2016-08-19 DIAGNOSIS — I10 Essential (primary) hypertension: Secondary | ICD-10-CM

## 2016-08-19 DIAGNOSIS — R519 Headache, unspecified: Secondary | ICD-10-CM

## 2016-08-19 DIAGNOSIS — R51 Headache: Secondary | ICD-10-CM

## 2016-08-19 DIAGNOSIS — Z789 Other specified health status: Secondary | ICD-10-CM | POA: Insufficient documentation

## 2016-08-19 NOTE — Progress Notes (Signed)
Subjective: Chief Complaint  Patient presents with  . Advice Only    B/P stoped B/P med 5 days ago due to the way it made her feel she feels much better but needs to know a healthy range for her B/p   Here for recheck on BP, headache, dizziness.  She continues to monitor BPs at home and still gets mostly normal to low readings.   Only has had 1 reading with DBP above 90 in last month.  She has been primarily taking 1/2 amlodipine or not taking it at all.  Hasn't taken amlodipine at all in 4-5 days and feels great. When not taking amlodipine doesn't get headache or dizziness.   She has only ever taken amlodipine and that has just been in the last year.   No other c/o.     She did just learn that her half sister had recent diagnosis of overactive thyroid with acute weight loss and heart failure.  She is 41yo, has different father than her.   Past Medical History:  Diagnosis Date  . 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 Constipation   . H/O mammogram 2012   benign, eval due to right breast lump  . Hypertension 2016  . IUD (intrauterine device) in place 05/2015   Mirena  . Migraine   . Routine gynecological examination    [name], [place]  . Seasonal allergic rhinitis    allergy testing 06/2013 with [facility]  . Type a blood, rh negative   . Vitamin D deficiency    Current Outpatient Prescriptions on File Prior to Visit  Medication Sig Dispense Refill  . BIOTIN PO Take 10,000 mcg by mouth daily.    . fexofenadine (ALLEGRA) 180 MG tablet Take 180 mg by mouth daily.    Marland Kitchen levonorgestrel (MIRENA) 20 MCG/24HR IUD 1 each by Intrauterine route once.    . Vitamin D, Ergocalciferol, (DRISDOL) 50000 units CAPS capsule Take 1 capsule (50,000 Units total) by mouth every 7 (seven) days. 12 capsule 3  . amLODipine (NORVASC) 5 MG tablet Take 1 tablet (5 mg total) by mouth daily. (Patient not taking: Reported on 08/19/2016) 90 tablet 3   No current  facility-administered medications on file prior to visit.    ROS as in subjective  Objective: BP 128/90   Pulse (!) 124   Resp 10   Ht 5\' 7"  (1.702 m)   Wt 177 lb 9.6 oz (80.6 kg)   LMP 07/13/2016   SpO2 99%   BMI 27.82 kg/m   Gen: wd, wn nad Heart RRR, normal s1, s2, no murmurs Pulses WNL No edema Otherwise not examined    Assessment: Encounter Diagnoses  Name Primary?  . Essential hypertension Yes  . Dizziness and giddiness   . Nonintractable headache, unspecified chronicity pattern, unspecified headache type   . Medication intolerance     Plan: Headache and dizziness have resolved since stopping amlodipine.   Will update allergy record to include intolerance to amlodipine.     For now given lots of normal home BP readings, no medication will be used.   Advised she increased exercise to 4-5 or more days per week.  discussed goal heart rate with exercise, having cool down period with exercising, limiting salt, fried foods, fast foods, processed foods.   C/t monitoring BP but don't be obsessive with it.      She will call back within 2 months if DBP is running over 90. If so, will use  trial of HCTZ.   For now, just c/t without BP medications.

## 2016-09-05 ENCOUNTER — Other Ambulatory Visit: Payer: Self-pay | Admitting: Medical

## 2016-09-05 DIAGNOSIS — Z0000 Encounter for general adult medical examination without abnormal findings: Secondary | ICD-10-CM

## 2016-09-05 DIAGNOSIS — I10 Essential (primary) hypertension: Secondary | ICD-10-CM

## 2016-10-19 DIAGNOSIS — H1045 Other chronic allergic conjunctivitis: Secondary | ICD-10-CM | POA: Diagnosis not present

## 2016-11-04 ENCOUNTER — Other Ambulatory Visit: Payer: Self-pay | Admitting: Family Medicine

## 2016-11-04 ENCOUNTER — Other Ambulatory Visit: Payer: Self-pay | Admitting: Obstetrics and Gynecology

## 2016-11-04 DIAGNOSIS — N6320 Unspecified lump in the left breast, unspecified quadrant: Secondary | ICD-10-CM

## 2016-11-09 DIAGNOSIS — Z124 Encounter for screening for malignant neoplasm of cervix: Secondary | ICD-10-CM | POA: Diagnosis not present

## 2016-11-09 DIAGNOSIS — Z01419 Encounter for gynecological examination (general) (routine) without abnormal findings: Secondary | ICD-10-CM | POA: Diagnosis not present

## 2016-11-09 DIAGNOSIS — Z113 Encounter for screening for infections with a predominantly sexual mode of transmission: Secondary | ICD-10-CM | POA: Diagnosis not present

## 2016-11-09 LAB — HM PAP SMEAR: HM Pap smear: NORMAL

## 2016-11-10 ENCOUNTER — Ambulatory Visit
Admission: RE | Admit: 2016-11-10 | Discharge: 2016-11-10 | Disposition: A | Discharge status: Home / Self Care | Payer: BLUE CROSS/BLUE SHIELD | Source: Ambulatory Visit | Attending: Obstetrics and Gynecology | Admitting: Obstetrics and Gynecology

## 2016-11-10 ENCOUNTER — Other Ambulatory Visit: Payer: BLUE CROSS/BLUE SHIELD

## 2016-11-10 DIAGNOSIS — N6002 Solitary cyst of left breast: Secondary | ICD-10-CM | POA: Diagnosis not present

## 2016-11-10 DIAGNOSIS — N6320 Unspecified lump in the left breast, unspecified quadrant: Secondary | ICD-10-CM

## 2016-11-10 DIAGNOSIS — R928 Other abnormal and inconclusive findings on diagnostic imaging of breast: Secondary | ICD-10-CM | POA: Diagnosis not present

## 2016-12-02 DIAGNOSIS — A609 Anogenital herpesviral infection, unspecified: Secondary | ICD-10-CM | POA: Diagnosis not present

## 2016-12-31 DIAGNOSIS — F4321 Adjustment disorder with depressed mood: Secondary | ICD-10-CM | POA: Diagnosis not present

## 2017-01-17 DIAGNOSIS — F4321 Adjustment disorder with depressed mood: Secondary | ICD-10-CM | POA: Diagnosis not present

## 2017-03-03 ENCOUNTER — Encounter: Payer: Self-pay | Admitting: Family Medicine

## 2017-03-03 ENCOUNTER — Ambulatory Visit (INDEPENDENT_AMBULATORY_CARE_PROVIDER_SITE_OTHER): Payer: BLUE CROSS/BLUE SHIELD | Admitting: Family Medicine

## 2017-03-03 VITALS — BP 140/100 | HR 90 | Temp 98.8°F | Resp 18 | Wt 178.4 lb

## 2017-03-03 DIAGNOSIS — J209 Acute bronchitis, unspecified: Secondary | ICD-10-CM

## 2017-03-03 MED ORDER — FLUCONAZOLE 150 MG PO TABS: 150.0000 mg | ORAL_TABLET | Freq: Once | ORAL | 0 refills | Status: AC

## 2017-03-03 MED ORDER — ALBUTEROL SULFATE HFA 108 (90 BASE) MCG/ACT IN AERS: 2.0000 | INHALATION_SPRAY | Freq: Four times a day (QID) | RESPIRATORY_TRACT | 0 refills | Status: DC | PRN

## 2017-03-03 MED ORDER — AZITHROMYCIN 250 MG PO TABS: ORAL_TABLET | ORAL | 0 refills | Status: DC

## 2017-03-03 NOTE — Progress Notes (Signed)
Subjective:  Christine Gardner is a 41 y.o. female who presents for possible bronchitis.  Symptoms include a 7-8 day history of cough that is worsening. Reports initially she had a dry cough and now for the past 2 days her cough is productive of thick green sputum and she is having tightness in her chest and wheezing.   Denies fever, chills, dizziness, rhinorrhea, nasal congestion, sore throat, palpitations, abdominal pain, N/V/D.   Treatment to date: cough suppressants and decongestants.  ? sick contacts.   She does not smoke.   She does not have a history of bronchitis.   No other aggravating or relieving factors.  No other c/o.  The following portions of the patient's history were reviewed and updated as appropriate: allergies, current medications, past family history, past medical history, past social history, past surgical history and problem list.  ROS as in subjective  Past Medical History:  Diagnosis Date  . 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 Constipation   . H/O mammogram 2012   benign, eval due to right breast lump  . Hypertension 2016  . IUD (intrauterine device) in place 05/2015   Mirena  . Migraine   . Routine gynecological examination    [name], [place]  . Seasonal allergic rhinitis    allergy testing 06/2013 with [facility]  . Type A blood, Rh negative   . Vitamin D deficiency      Objective: Vital signs reviewed BP (!) 140/100   Pulse 90   Temp 98.8 F (37.1 C) (Oral)   Resp 18   Wt 178 lb 6.4 oz (80.9 kg)   SpO2 99%   BMI 27.94 kg/m    General appearance: Alert, WD/WN, no distress, ill appearing                             Skin: warm, no rash, no diaphoresis                           Head: no sinus tenderness                            Eyes: conjunctiva normal, corneas clear, PERRLA                            Ears: pearly TMs, external ear canals normal                          Nose: septum midline, turbinates swollen,  with erythema and clear discharge             Mouth/throat: MMM, tongue normal, mild pharyngeal erythema                           Neck: supple, no adenopathy, no thyromegaly, nontender                          Heart: RRR, normal S1, S2, no murmurs                         Lungs: +bronchial breath sounds, +scattered rhonchi, mild expiratory wheezes to LUF and LLF, no rales  Extremities: no edema, nontender      Assessment: Acute bronchitis, unspecified organism - Plan: azithromycin (ZITHROMAX Z-PAK) 250 MG tablet, albuterol (PROVENTIL HFA;VENTOLIN HFA) 108 (90 Base) MCG/ACT inhaler    Plan:  Medication orders today include: Z-pack, albuterol inhaler and Diflucan.  She will use the inhaler for 2-3 days and then stop. If she continues to need the inhaler then she will follow up.  She is aware that her blood pressure is elevated and has a history of elevated pressures but states they were back to normal so she is no longer on medication for it. She will check her BP at home once daily for the next week and if it is still elevated then she will call and schedule with her PCP Audelia Acton.  Diflucan prescribed per patient request due to history of yeast infection with Abx.     Discussed diagnosis and treatment of bronchitis.  Suggested symptomatic OTC remedies for cough and congestion.  Tylenol or Ibuprofen OTC for fever and malaise.  Call/return if not back to baseline after day 10.  Advised that cough may linger even after the infection is improved.

## 2017-03-03 NOTE — Patient Instructions (Signed)
Drink plenty of fluids. Take the Z-pack as directed and use the inhaler as needed for the next 2-3 days for wheezing and cough. You can try Mucinex or Guaifenesin (PLAIN).  Let me know if you are not back to baseline after day 10 of starting the antibiotic.

## 2017-04-25 ENCOUNTER — Ambulatory Visit (INDEPENDENT_AMBULATORY_CARE_PROVIDER_SITE_OTHER): Payer: BLUE CROSS/BLUE SHIELD | Admitting: Medical

## 2017-04-25 ENCOUNTER — Encounter: Payer: Self-pay | Admitting: Medical

## 2017-04-25 VITALS — BP 118/80 | HR 88 | Wt 180.0 lb

## 2017-04-25 DIAGNOSIS — R002 Palpitations: Secondary | ICD-10-CM

## 2017-04-25 NOTE — Patient Instructions (Signed)
FYI Normal pulse is 70-100 beats per minute resting.  typically we would want to see in the 70-80 range for you.   With exercise it could be 100-160 for example bpm. If you consistently see high 90s or over 100 bpm at rest in the coming weeks, then let me know  Normal blood pressure is 120/70-130/80.       Sinus Tachycardia Sinus tachycardia is a kind of fast heartbeat. In sinus tachycardia, the heart beats more than 100 times a minute. Sinus tachycardia starts in a part of the heart called the sinus node. Sinus tachycardia may be harmless, or it may be a sign of a serious condition. What are the causes? This condition may be caused by:  Exercise or exertion.  A fever.  Pain.  Loss of body fluids (dehydration).  Severe bleeding (hemorrhage).  Anxiety and stress.  Certain substances, including:  Alcohol.  Caffeine.  Tobacco and nicotine products.  Diet pills.  Illegal drugs.  Medical conditions including:  Heart disease.  An infection.  An overactive thyroid (hyperthyroidism).  A lack of red blood cells (anemia). What are the signs or symptoms? Symptoms of this condition include:  A feeling that the heart is beating quickly (palpitations).  Suddenly noticing your heartbeat (cardiac awareness).  Dizziness.  Tiredness (fatigue).  Shortness of breath.  Chest pain.  Nausea.  Fainting. How is this diagnosed? This condition is diagnosed with:  A physical exam.  Other tests, such as:  Blood tests.  An electrocardiogram (ECG). This test measures the electrical activity of the heart.  Holter monitoring. For this test, you wear a device that records your heartbeat for one or more days. You may be referred to a heart specialist (cardiologist). How is this treated? Treatment for this condition depends on the cause or underlying condition. Treatment may involve:  Treating the underlying condition.  Taking new medicines or changing your current  medicines as told by your health care provider.  Making changes to your diet or lifestyle.  Practicing relaxation methods. Follow these instructions at home: Lifestyle   Do not use any products that contain nicotine or tobacco, such as cigarettes and e-cigarettes. If you need help quitting, ask your health care provider.  Learn relaxation methods, like deep breathing, to help you when you get stressed or anxious.  Do not use illegal drugs, such as cocaine.  Do not abuse alcohol. Limit alcohol intake to no more than 1 drink a day for non-pregnant women and 2 drinks a day for men. One drink equals 12 oz of beer, 5 oz of wine, or 1 oz of hard liquor.  Find time to rest and relax often. This reduces stress.  Avoid:  Caffeine.  Stimulants such as over-the-counter diet pills or pills that help you to stay awake.  Situations that cause anxiety or stress. General instructions   Drink enough fluids to keep your urine clear or pale yellow.  Take over-the-counter and prescription medicines only as told by your health care provider.  Keep all follow-up visits as told by your health care provider. This is important. Contact a health care provider if:  You have a fever.  You have vomiting or diarrhea that keeps happening (is persistent). Get help right away if:  You have pain in your chest, upper arms, jaw, or neck.  You become weak or dizzy.  You feel faint.  You have palpitations that do not go away. This information is not intended to replace advice given to you by  your health care provider. Make sure you discuss any questions you have with your health care provider. Document Released: 12/30/2004 Document Revised: 06/19/2016 Document Reviewed: 06/06/2015 Elsevier Interactive Patient Education  2017 [employer].

## 2017-04-25 NOTE — Progress Notes (Signed)
Subjective: Chief Complaint  Patient presents with  . heart  racing    heart racing  x 1 month    Here for heart racing again.   We have discussed this prior, and she had normal event monitor back in 2016.   A few weeks ago was having some mild headaches and feeling heart racing.  Was monitoring BP, and the BPs were running normal. This past week they went out of town to [place].   She started watching her pulse on her smart watch.  Over the weekend had pulse ranging 70s-97.   With palpitations, no associated SOB, sweats, chest pain, nausea.  She is taking some OTC fish oil, Coq10, D3, vit B complex, multivitamin.  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  No other aggravating or relieving factors. No other complaint.  Past Medical History:  Diagnosis Date  . 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 Constipation   . H/O mammogram 2012   benign, eval due to right breast lump  . Hypertension 2016  . IUD (intrauterine device) in place 05/2015   Mirena  . Migraine   . Routine gynecological examination    [name], [place]  . Seasonal allergic rhinitis    allergy testing 06/2013 with [facility]  . Type A blood, Rh negative   . Vitamin D deficiency    Current Outpatient Prescriptions on File Prior to Visit  Medication Sig Dispense Refill  . Bioflavonoid Products (VITAMIN C) CHEW Chew by mouth.    . cholecalciferol (VITAMIN D) 1000 units tablet Take 2,000 Units by mouth daily.    . fexofenadine (ALLEGRA) 180 MG tablet Take 180 mg by mouth daily.    Marland Kitchen levonorgestrel (MIRENA) 20 MCG/24HR IUD 1 each by Intrauterine route once.     No current facility-administered medications on file prior to visit.    ROS as in subjective   Objective: BP 118/80   Pulse 88   Wt 180 lb (81.6 kg)   SpO2 97%   BMI 28.19 kg/m   General  appearance: alert, no distress, WD/WN Neck: supple, no lymphadenopathy, no thyromegaly, no masses Heart: RRR, normal S1, S2, no murmurs Lungs: CTA bilaterally, no wheezes, rhonchi, or rales Abdomen: +bs, soft, non tender, non distended, no masses, no hepatomegaly, no splenomegaly Pulses: 2+ symmetric, upper and lower extremities, normal cap refill Ext: no edema   Adult ECG Report  Indication: palpitations  Rate: 74 bpm  Rhythm: normal sinus rhythm  QRS Axis: 64 degrees  PR Interval: 175ms  QRS Duration: 8ms  QTc: 487ms  Conduction Disturbances: none  Other Abnormalities: none  Patient's cardiac risk factors are: none.  EKG comparison: 2016 EKG  Narrative Interpretation: no acute changes    Assessment: Encounter Diagnosis  Name Primary?  . Palpitations Yes    Plan: Discussed her symptoms, EKG prior, EKG today, prior eval for same in 2016, prior 07/2016 labs.    No specific abnormality seen today.  We discussed normal pulse, normal BPs, self monitoring.  If she continues to see elevated pulses then next step would be cardiology referral and labs BMET, TSH, T4, CBC.    Indie was seen today for heart  racing.  Diagnoses and all orders for this visit:  Palpitations -     EKG 12-Lead

## 2017-05-11 ENCOUNTER — Encounter: Payer: Self-pay | Admitting: Family Medicine

## 2017-05-11 ENCOUNTER — Ambulatory Visit (INDEPENDENT_AMBULATORY_CARE_PROVIDER_SITE_OTHER): Payer: BLUE CROSS/BLUE SHIELD | Admitting: Family Medicine

## 2017-05-11 VITALS — BP 120/72 | HR 98 | Temp 98.8°F | Wt 176.0 lb

## 2017-05-11 DIAGNOSIS — B9689 Other specified bacterial agents as the cause of diseases classified elsewhere: Secondary | ICD-10-CM

## 2017-05-11 DIAGNOSIS — N760 Acute vaginitis: Secondary | ICD-10-CM

## 2017-05-11 DIAGNOSIS — Z7251 High risk heterosexual behavior: Secondary | ICD-10-CM

## 2017-05-11 LAB — POCT WET PREP (WET MOUNT)
CLUE CELLS WET PREP WHIFF POC: POSITIVE
Trichomonas Wet Prep HPF POC: ABSENT

## 2017-05-11 MED ORDER — CLINDAMYCIN PHOSPHATE 2 % VA CREA: 1.0000 | TOPICAL_CREAM | Freq: Every day | VAGINAL | 0 refills | Status: DC

## 2017-05-11 NOTE — Progress Notes (Signed)
   Subjective:    Patient ID: Christine Gardner, female    DOB: [birth year], 41 y.o.   MRN: [id number]  HPI Chief Complaint  Patient presents with  . vaginal bacteria    bf is being treated for bacterial infection with flagyl and she is coming in to make sure she gets treat, no odor or discharge   She is here with concerns regarding her boyfriend being diagnosed with a bacterial infection which apperars to have been urethritis but not sure. States he was negative for STDs.  She denies having any symptoms.  Denies fever, chills, abdominal pain, back pain, N/V/D, urinary frequency, urgency, or vaginal discharge.  Has an IUD.  Denies history of STDs.    Review of Systems Pertinent positives and negatives in the history of present illness.     Objective:   Physical Exam BP 120/72   Pulse 98   Temp 98.8 F (37.1 C) (Oral)   Wt 176 lb (79.8 kg)   SpO2 98%   BMI 27.57 kg/m   Alert and oriented and in no acute distress. Not otherwise examined. Patient requests to do a self swab.       Assessment & Plan:  BV (bacterial vaginosis) - Plan: POCT Wet Prep (Wet [place]), GC/Chlamydia Probe Amp, clindamycin (CLEOCIN) 2 % vaginal cream  Unprotected sex - Plan: POCT Wet Prep (Wet [place]), GC/Chlamydia Probe Amp  Patient preferred self swab.  Wet mount + whiff, few clue cells. No trich.  Plan to check GC/CT and treat for BV.  She has history of severe allergy to metronidazole so we will treat with vaginal clindamycin.  Follow up if not at baseline after treatment.

## 2017-05-12 LAB — GC/CHLAMYDIA PROBE AMP
CT Probe RNA: NOT DETECTED
GC Probe RNA: NOT DETECTED

## 2017-10-24 DIAGNOSIS — H04123 Dry eye syndrome of bilateral lacrimal glands: Secondary | ICD-10-CM | POA: Diagnosis not present

## 2017-11-23 DIAGNOSIS — Z01419 Encounter for gynecological examination (general) (routine) without abnormal findings: Secondary | ICD-10-CM | POA: Diagnosis not present

## 2017-11-23 DIAGNOSIS — Z6827 Body mass index (BMI) 27.0-27.9, adult: Secondary | ICD-10-CM | POA: Diagnosis not present

## 2017-11-23 DIAGNOSIS — Z1231 Encounter for screening mammogram for malignant neoplasm of breast: Secondary | ICD-10-CM | POA: Diagnosis not present

## 2017-11-23 DIAGNOSIS — Z975 Presence of (intrauterine) contraceptive device: Secondary | ICD-10-CM | POA: Diagnosis not present

## 2017-12-03 LAB — HM MAMMOGRAPHY

## 2017-12-14 ENCOUNTER — Encounter: Payer: BLUE CROSS/BLUE SHIELD | Admitting: Medical

## 2017-12-19 ENCOUNTER — Ambulatory Visit: Payer: BLUE CROSS/BLUE SHIELD | Admitting: Medical

## 2017-12-19 ENCOUNTER — Encounter: Payer: Self-pay | Admitting: Medical

## 2017-12-19 VITALS — BP 118/78 | HR 100 | Ht 67.0 in | Wt 164.8 lb

## 2017-12-19 DIAGNOSIS — Z2821 Immunization not carried out because of patient refusal: Secondary | ICD-10-CM

## 2017-12-19 DIAGNOSIS — F430 Acute stress reaction: Secondary | ICD-10-CM

## 2017-12-19 DIAGNOSIS — Z975 Presence of (intrauterine) contraceptive device: Secondary | ICD-10-CM

## 2017-12-19 DIAGNOSIS — J301 Allergic rhinitis due to pollen: Secondary | ICD-10-CM | POA: Diagnosis not present

## 2017-12-19 DIAGNOSIS — G479 Sleep disorder, unspecified: Secondary | ICD-10-CM | POA: Diagnosis not present

## 2017-12-19 DIAGNOSIS — K5900 Constipation, unspecified: Secondary | ICD-10-CM | POA: Diagnosis not present

## 2017-12-19 DIAGNOSIS — R030 Elevated blood-pressure reading, without diagnosis of hypertension: Secondary | ICD-10-CM | POA: Diagnosis not present

## 2017-12-19 DIAGNOSIS — E559 Vitamin D deficiency, unspecified: Secondary | ICD-10-CM | POA: Diagnosis not present

## 2017-12-19 DIAGNOSIS — Z0000 Encounter for general adult medical examination without abnormal findings: Secondary | ICD-10-CM

## 2017-12-19 DIAGNOSIS — I10 Essential (primary) hypertension: Secondary | ICD-10-CM | POA: Diagnosis not present

## 2017-12-19 LAB — POCT URINALYSIS DIP (PROADVANTAGE DEVICE)
Bilirubin, UA: NEGATIVE
Glucose, UA: NEGATIVE mg/dL
Leukocytes, UA: NEGATIVE
NITRITE UA: NEGATIVE
RBC UA: NEGATIVE
Specific Gravity, Urine: 1.025
Urobilinogen, Ur: NEGATIVE
pH, UA: 6 (ref 5.0–8.0)

## 2017-12-19 MED ORDER — ZOLPIDEM TARTRATE 10 MG PO TABS: ORAL_TABLET | ORAL | 0 refills | Status: DC

## 2017-12-19 NOTE — Progress Notes (Signed)
Subjective:   HPI  Christine Gardner is a 42 y.o. female who presents for a complete physical.  Preventative care: [facility] Dr. Joni Reining, dentist  [facility] [place], [place], [place] here for primary care  Concerns: Going through a lot of stress lately.  Not eating or sleeping well.  Got sleep aid from [facility], not helping.  Been feeling this way a few weeks.  Her boyfriend of 7 years and her have had some recent issues.  Feeling some palpitations she attributes to anxiety.  Anxious feeling.  declines flu shot  Reviewed their medical, surgical, family, social, medication, and allergy history and updated chart as appropriate.  Past Medical History:  Diagnosis Date  . 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 Constipation   . H/O mammogram 2012   benign, eval due to right breast lump  . Hypertension 2016  . IUD (intrauterine device) in place 05/2015   Mirena  . Migraine   . Routine gynecological examination    [name], [place]  . Seasonal allergic rhinitis    allergy testing 06/2013 with [place]  . Type A blood, Rh negative   . Vitamin D deficiency     Past Surgical History:  Procedure Laterality Date  . COLONOSCOPY  12/2015   polyp.   [place], Dr. Doristine Devoid  . COLPOSCOPY  2013   LGSIL, f/u q64mo as of 6/14  . TOE SURGERY     hammer toe 4th, 5th, bilat    Social History   Socioeconomic History  . Marital status: Married    Spouse name: Not on file  . Number of children: Not on file  . Years of education: Not on file  . Highest education level: Not on file  Social Needs  . Financial resource strain: Not on file  . Food insecurity - worry: Not on file  . Food insecurity - inability: Not on file  . Transportation needs - medical: Not on file  . Transportation needs - non-medical: Not on file  Occupational History  . Not on file  Tobacco Use  . Smoking status: Never Smoker  . Smokeless tobacco:  Never Used  Substance and Sexual Activity  . Alcohol use: Yes    Alcohol/week: 1.2 oz    Types: 2 Glasses of wine per week  . Drug use: No  . Sexual activity: Yes    Birth control/protection: IUD    Comment: mirena /vas   Other Topics Concern  . Not on file  Social History Narrative   Divorced, boyfriend.   23yo, 20yo, 15yo children, 1 grandchild.  Exercise 3-4 days per week with treadmill, elliptical, weights.  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  08/2017 opened up [employer].   [employer].  As of 12/2017    Family History  Problem Relation Age of Onset  . Hyperlipidemia Mother   . Migraines Mother   . Hypertension Mother   . Diabetes Mother   . Heart disease Father 69       died of MI  . Other Father        quesionable drug abuse  . Hypertension Sister   . Hypertension Brother   . Hypertension Sister   . Hyperlipidemia Maternal Grandmother   . Diabetes Maternal Grandmother   . Heart disease Sister        CHF due to hyperthyroid  . Hyperthyroidism Sister   . Cancer Maternal Aunt  breast  . Cancer Maternal Aunt        breast  . Stroke Neg Hx   . Colon cancer Neg Hx      Current Outpatient Medications:  .  cholecalciferol (VITAMIN D) 1000 units tablet, Take 2,000 Units by mouth daily., Disp: , Rfl:  .  levonorgestrel (MIRENA) 20 MCG/24HR IUD, 1 each by Intrauterine route once., Disp: , Rfl:  .  Probiotic Product (PROBIOTIC DAILY PO), Take by mouth., Disp: , Rfl:  .  Bioflavonoid Products (VITAMIN C) CHEW, Chew by mouth., Disp: , Rfl:  .  fexofenadine (ALLEGRA) 180 MG tablet, Take 180 mg by mouth daily., Disp: , Rfl:  .  zolpidem (AMBIEN) 10 MG tablet, 1/2-1 tablet po prn QHS, Disp: 15 tablet, Rfl: 0  Allergies  Allergen Reactions  . Flagyl [Metronidazole] Anaphylaxis  . Vicodin [Hydrocodone-Acetaminophen]     itching  . Amlodipine     Headache, dizziness  . Percocet [Oxycodone-Acetaminophen] Itching  . Tramadol Itching   Review of  Systems Constitutional: -fever, -chills, -sweats, -unexpected weight change, -decreased appetite, -fatigue Allergy: -sneezing, -itching, -congestion Dermatology: -changing moles, --rash, -lumps ENT: -runny nose, -ear pain, -sore throat, -hoarseness, -sinus pain, -teeth pain, - ringing in ears, -hearing loss, -nosebleeds Cardiology: -chest pain, -palpitations, -swelling, -difficulty breathing when lying flat, -waking up short of breath Respiratory: -cough, -shortness of breath, -difficulty breathing with exercise or exertion, -wheezing, -coughing up blood Gastroenterology: -abdominal pain, -nausea, -vomiting, -diarrhea, -constipation, -blood in stool, -changes in bowel movement, -difficulty swallowing or eating Hematology: -bleeding, -bruising  Musculoskeletal: -joint aches, -muscle aches, -joint swelling, -back pain, -neck pain, -cramping, -changes in gait Ophthalmology: denies vision changes, eye redness, itching, discharge Urology: -burning with urination, -difficulty urinating, -blood in urine, -urinary frequency, -urgency, -incontinence Neurology: -headache, -weakness, -tingling, -numbness, -memory loss, -falls, +dizziness Psychology: -depressed mood, -agitation, +sleep problems     Objective:   Physical Exam  BP 118/78   Pulse 100   Ht 5\' 7"  (1.702 m)   Wt 164 lb 12.8 oz (74.8 kg)   LMP 12/08/2017   SpO2 98%   BMI 25.81 kg/m   BP Readings from Last 3 Encounters:  12/19/17 118/78  05/11/17 120/72  04/25/17 118/80   General appearance: alert, no distress, WD/WN, pleasant AA female  Skin: tattoo right lower flank/abdomen, tattoo upper back, tattoo lower half of back, no worrisome lesions  HEENT: normocephalic, conjunctiva/corneas normal, sclerae anicteric, PERRLA, EOMi, nares patent, no discharge or erythema, pharynx normal Oral cavity: MMM, tongue normal, teeth in good repair  Neck: supple, no lymphadenopathy, no thyromegaly, no masses, normal ROM  Heart: RRR, normal S1,  S2, no murmurs  Lungs: CTA bilaterally, no wheezes, rhonchi, or rales  Abdomen: +bs, soft, non tender, non distended, no masses, no hepatomegaly, no splenomegaly, no bruits  Back: non tender, normal ROM, no scoliosis  Musculoskeletal: upper extremities non tender, no obvious deformity, normal ROM throughout, lower extremities non tender, no obvious deformity, normal ROM throughout  Extremities: no edema, no cyanosis, no clubbing  Pulses: 2+ symmetric, upper and lower extremities, normal cap refill  Neurological: alert, oriented x 3, CN2-12 intact, strength normal upper extremities and lower extremities, sensation normal throughout, DTRs 2+ throughout, no cerebellar signs, gait normal  Psychiatric: normal affect, behavior normal, pleasant  Breast/gyn/rectal - deferred to gynecology   Assessment and Plan :    Encounter Diagnoses  Name Primary?  . Routine general medical examination at a health care facility Yes  . Temporary high blood pressure   .  Vitamin D deficiency   . Allergic rhinitis due to pollen, unspecified seasonality   . IUD (intrauterine device) in place   . Influenza vaccination declined   . Constipation, unspecified constipation type   . Acute stress reaction   . Sleep disturbance     Physical exam - discussed healthy lifestyle, diet, exercise, preventative care, vaccinations, and addressed their concerns. See your eye doctor yearly for routine vision care. See your dentist yearly for routine dental care including hygiene visits twice yearly. See your gynecologist yearly for routine gynecological care.   Acute stress, insomnia - gave script for short term Ambien, discussed proper use, f/u with counseling  Hx/o HTN but seems to be controlled off medication for over a year  Chronic constipation - no major issues lately   Follow-up pending labs  Anhelica was seen today for annual exam.  Diagnoses and all orders for this visit:  Routine general medical  examination at a health care facility -     POCT Urinalysis DIP (Proadvantage Device) -     Lipid panel -     CBC -     Basic metabolic panel -     Iron and TIBC -     VITAMIN D 25 Hydroxy (Vit-D Deficiency, Fractures)  Temporary high blood pressure  Vitamin D deficiency -     VITAMIN D 25 Hydroxy (Vit-D Deficiency, Fractures)  Allergic rhinitis due to pollen, unspecified seasonality  IUD (intrauterine device) in place  Influenza vaccination declined  Constipation, unspecified constipation type -     CBC  Acute stress reaction  Sleep disturbance  Other orders -     zolpidem (AMBIEN) 10 MG tablet; 1/2-1 tablet po prn QHS

## 2017-12-20 ENCOUNTER — Other Ambulatory Visit: Payer: Self-pay | Admitting: Medical

## 2017-12-20 LAB — BASIC METABOLIC PANEL
BUN/Creatinine Ratio: 13 (ref 9–23)
BUN: 9 mg/dL (ref 6–24)
CALCIUM: 9.9 mg/dL (ref 8.7–10.2)
CO2: 21 mmol/L (ref 20–29)
CREATININE: 0.69 mg/dL (ref 0.57–1.00)
Chloride: 101 mmol/L (ref 96–106)
GFR calc Af Amer: 124 mL/min/{1.73_m2} (ref >59)
GFR calc non Af Amer: 108 mL/min/{1.73_m2} (ref >59)
GLUCOSE: 65 mg/dL (ref 65–99)
Potassium: 4.2 mmol/L (ref 3.5–5.2)
Sodium: 139 mmol/L (ref 134–144)

## 2017-12-20 LAB — CBC
HEMOGLOBIN: 13.7 g/dL (ref 11.1–15.9)
Hematocrit: 39.6 % (ref 34.0–46.6)
MCH: 32.9 pg (ref 26.6–33.0)
MCHC: 34.6 g/dL (ref 31.5–35.7)
MCV: 95 fL (ref 79–97)
Platelets: 280 10*3/uL (ref 150–379)
RBC: 4.16 x10E6/uL (ref 3.77–5.28)
RDW: 12.7 % (ref 12.3–15.4)
WBC: 7.6 10*3/uL (ref 3.4–10.8)

## 2017-12-20 LAB — LIPID PANEL
CHOL/HDL RATIO: 3.9 ratio (ref 0.0–4.4)
CHOLESTEROL TOTAL: 185 mg/dL (ref 100–199)
HDL: 47 mg/dL (ref >39)
LDL CALC: 126 mg/dL — AB (ref 0–99)
Triglycerides: 58 mg/dL (ref 0–149)
VLDL Cholesterol Cal: 12 mg/dL (ref 5–40)

## 2017-12-20 LAB — IRON AND TIBC
IRON SATURATION: 44 % (ref 15–55)
IRON: 138 ug/dL (ref 27–159)
TIBC: 312 ug/dL (ref 250–450)
UIBC: 174 ug/dL (ref 131–425)

## 2017-12-20 LAB — VITAMIN D 25 HYDROXY (VIT D DEFICIENCY, FRACTURES): VIT D 25 HYDROXY: 24.2 ng/mL — AB (ref 30.0–100.0)

## 2017-12-20 MED ORDER — VITAMIN D (ERGOCALCIFEROL) 1.25 MG (50000 UNIT) PO CAPS: 50000.0000 [IU] | ORAL_CAPSULE | ORAL | 3 refills | Status: DC

## 2017-12-29 ENCOUNTER — Telehealth: Payer: Self-pay | Admitting: Medical

## 2017-12-29 NOTE — Telephone Encounter (Signed)
Received requested pap from [facility]. Sending back for review.  °

## 2018-04-10 DIAGNOSIS — M7712 Lateral epicondylitis, left elbow: Secondary | ICD-10-CM | POA: Diagnosis not present

## 2018-09-12 ENCOUNTER — Encounter: Payer: Self-pay | Admitting: Medical

## 2018-09-12 ENCOUNTER — Ambulatory Visit: Payer: BLUE CROSS/BLUE SHIELD | Admitting: Medical

## 2018-09-12 VITALS — BP 150/90 | HR 81 | Temp 98.4°F | Resp 16 | Ht 67.0 in | Wt 180.2 lb

## 2018-09-12 DIAGNOSIS — R002 Palpitations: Secondary | ICD-10-CM | POA: Diagnosis not present

## 2018-09-12 DIAGNOSIS — I10 Essential (primary) hypertension: Secondary | ICD-10-CM | POA: Diagnosis not present

## 2018-09-12 MED ORDER — AMLODIPINE BESYLATE 5 MG PO TABS: 5.0000 mg | ORAL_TABLET | Freq: Every day | ORAL | 3 refills | Status: DC

## 2018-09-12 NOTE — Progress Notes (Signed)
Subjective: Chief Complaint  Patient presents with  . bp    bp elevated    Here for concern about BP.   Yesterday went for facial treatment, and wrist cuff showed 180s/100s.  Last night at home 160/108 at home.   Had been on Amlodipine some in past, but had done well off medication with healthy lifestyle.     Lately has felt some fatigue or not feelign well at times.   Worried about BP.   Denies SOB, no dyspnea.   Sometimes feels some palpitations.    Exercises twice weekly with a trainer, strenuous work out.    No other aggravating or relieving factors. No other complaint.   Past Medical History:  Diagnosis Date  . 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 Constipation   . H/O mammogram 2012   benign, eval due to right breast lump  . Hypertension 2016  . IUD (intrauterine device) in place 05/2015   Mirena  . Migraine   . Routine gynecological examination    [name], [place]  . Seasonal allergic rhinitis    allergy testing 06/2013 with [place]  . Type A blood, Rh negative   . Vitamin D deficiency    Current Outpatient Medications on File Prior to Visit  Medication Sig Dispense Refill  . levonorgestrel (MIRENA) 20 MCG/24HR IUD 1 each by Intrauterine route once.    Marland Kitchen Bioflavonoid Products (VITAMIN C) CHEW Chew by mouth.    . cholecalciferol (VITAMIN D) 1000 units tablet Take 2,000 Units by mouth daily.    . fexofenadine (ALLEGRA) 180 MG tablet Take 180 mg by mouth daily.    . Probiotic Product (PROBIOTIC DAILY PO) Take by mouth.    . Vitamin D, Ergocalciferol, (DRISDOL) 50000 units CAPS capsule Take 1 capsule (50,000 Units total) by mouth every 7 (seven) days. (Patient not taking: Reported on 09/12/2018) 12 capsule 3  . zolpidem (AMBIEN) 10 MG tablet 1/2-1 tablet po prn QHS (Patient not taking: Reported on 09/12/2018) 15 tablet 0   No current facility-administered medications on file prior to visit.    ROS as in  subjective    Objective: BP (!) 150/90   Pulse 81   Temp 98.4 F (36.9 C) (Oral)   Resp 16   Ht 5\' 7"  (1.702 m)   Wt 180 lb 3.2 oz (81.7 kg)   SpO2 100%   BMI 28.22 kg/m   BP Readings from Last 3 Encounters:  09/12/18 (!) 150/90  12/19/17 118/78  05/11/17 120/72   Wt Readings from Last 3 Encounters:  09/12/18 180 lb 3.2 oz (81.7 kg)  12/19/17 164 lb 12.8 oz (74.8 kg)  05/11/17 176 lb (79.8 kg)   General appearance: alert, no distress, WD/WN Neck: supple, no lymphadenopathy, no thyromegaly, no masses, no bruits Heart: occasional ectopic beat, otherwise RRR, normal S1, S2, no murmurs Lungs: CTA bilaterally, no wheezes, rhonchi, or rales Ext: no edema Pulses: 2+ symmetric, upper and lower extremities, normal cap refill     Adult ECG Report  Indication: HTN  Rate: 69 bpm  Rhythm: normal sinus rhythm  QRS Axis: 52 degrees  PR Interval: 133ms  QRS Duration: 22ms  QTc: 451ms  Conduction Disturbances: none  Other Abnormalities: possible atrial enlargement  Patient's cardiac risk factors are: hypertension.  EKG comparison: 2017  Narrative Interpretation: unremarkable EKG other than atrial enlargement    Assessment: Encounter Diagnoses  Name Primary?  . Essential hypertension Yes  . Palpitation  Plan: Discussed her symptoms and findings.  She has been off medication for a while and is done fine with normal blood pressure readings but lately the readings have been creeping up similar to the readings today.  Begin back on medication as below.  Counseled on salt reduction, healthy diet, regular exercise, cut back on meat intake and animal product intake.  Work on losing some weight.  Follow-up in 4 to 6 weeks   Cathe was seen today for bp.  Diagnoses and all orders for this visit:  Essential hypertension -     EKG 12-Lead  Palpitation -     EKG 12-Lead  Other orders -     amLODipine (NORVASC) 5 MG tablet; Take 1 tablet (5 mg total) by mouth  daily.

## 2018-10-20 ENCOUNTER — Encounter: Payer: Self-pay | Admitting: Medical

## 2018-10-20 ENCOUNTER — Ambulatory Visit (INDEPENDENT_AMBULATORY_CARE_PROVIDER_SITE_OTHER): Payer: BLUE CROSS/BLUE SHIELD | Admitting: Medical

## 2018-10-20 VITALS — BP 122/70 | HR 88 | Temp 98.1°F | Resp 16 | Ht 67.0 in | Wt 183.2 lb

## 2018-10-20 DIAGNOSIS — R002 Palpitations: Secondary | ICD-10-CM | POA: Diagnosis not present

## 2018-10-20 DIAGNOSIS — I10 Essential (primary) hypertension: Secondary | ICD-10-CM

## 2018-10-20 NOTE — Patient Instructions (Signed)
Recommendations:  Check your blood pressures 4-5 days per week at different times of day, record these and get them to me in a months  Goal blood pressure should be 130/80 or less.   Low BP would be <100/60  If you feel a palpitation or chest pain, measure your pulse rate and write this down  Resting pulse should be between 70-100 bpm     I want you to eat 3 meals a day +2 snacks, one midmorning snack and one mid afternoon snack  Breakfast You may eat 1 of the following  Omelette, which can include a small amount of cheese, and vegetables such as peppers, mushrooms, small pieces of [place] or chicken  Low sugar yogurt serving which can include some fruit such as berries  Egg whites or hard boiled egg and meat (1-2 strips of bacon, or small piece of [place] sausage or [place] bacon)   Mid-morning snack 1 fruit serving such as one of the following:  medium-sized apple  medium-sized orange,  Tangerine  1/2 banana   3/4 cup of fresh berries or frozen berries  A protein source such as one of the following:  8 almonds   small handful of walnuts or other nuts  small piece of cheese,  low sugar yogurt   Lunch A protein source such as 1 of the following: . 1 serving of beans such as black beans, pinto beans, green beans, or edamame (soy beans) . 1 meat serving such as 6 oz or deck of card size serving of fish, skinless chicken, or [place], either grilled or baked preferably.   You can use some pork or beef, but limit this compared to fish, chicken or [place] Vegetable - Half of your plate should be a non-starchy vegetables!  So avoid white potatoes and corn.  Otherwise, eat a large portion of vegetables. . Avocado, cucumber, tomato, carrots, greens, lettuce, squash, okra, etc.  . Vegetables can include salad with olive oil/vinaigrette dressing   Mid-afternoon snack 1 fruit serving such as one of the following:  medium-sized apple  medium-sized  orange,  Tangerine  1/2 banana   3/4 cup of fresh berries or frozen berries  A protein source such as one of the following:  8 almonds   small handful of walnuts or other nuts  small piece of cheese,  low sugar yogurt   Dinner A protein source such as 1 of the following: . 1 serving of beans such as black beans, pinto beans, green beans, or edamame (soy beans) . 1 meat serving such as 6 oz or deck of card size serving of fish, skinless chicken, or [place], either grilled or baked preferably.   You can use some pork or beef, but limit this compared to fish, chicken or [place] Vegetable - Half of your plate should be a non-starchy vegetables!  So avoid white potatoes and corn.  Otherwise, eat a large portion of vegetables. . Avocado, cucumber, tomato, carrots, greens, lettuce, squash, okra, etc.  . Vegetables can include salad with olive oil/vinaigrette dressing   Beverages: Water Unsweet tea Home made juice with a juicer without sugar added other than small bit of honey or agave nectar Water with sugar free flavor such as [place]   AVOID.... For the time being I want you to cut out the following items completely: . Soda, sweet tea, juice, beer or wine or alcohol . ALL grains and breads including rice, pasta, bread, cereal . Sweets such as cake, candy, pies, chips, cookies,  chocolate

## 2018-10-20 NOTE — Progress Notes (Signed)
Subjective: Chief Complaint  Patient presents with  . follow up    follow up htn   Here for recheck   Last visit we started back on amlodipine 5 mg daily for blood pressure.  She is compliant with this and seeing normal readings at home.  She has a blood pressure cuff.  She has not felt as much palpitations since being back on medication.  She was having some palpitations prior to last visit.  She had been on blood pressure medicine in recent years on and off the blood pressures normalized for a while off medicine  She exercises regularly, sees a trainer twice a week, and does cardio 2 other days a week.  Eats healthy.  Limit salt.  Limit caffeine.  Otherwise healthy  Past Medical History:  Diagnosis Date  . 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 Constipation   . H/O mammogram 2012   benign, eval due to right breast lump  . Hypertension 2016  . IUD (intrauterine device) in place 05/2015   Mirena  . Migraine   . Routine gynecological examination    [name], [place]  . Seasonal allergic rhinitis    allergy testing 06/2013 with [place]  . Type A blood, Rh negative   . Vitamin D deficiency    Current Outpatient Medications on File Prior to Visit  Medication Sig Dispense Refill  . amLODipine (NORVASC) 5 MG tablet Take 1 tablet (5 mg total) by mouth daily. 90 tablet 3  . levonorgestrel (MIRENA) 20 MCG/24HR IUD 1 each by Intrauterine route once.    Marland Kitchen Bioflavonoid Products (VITAMIN C) CHEW Chew by mouth.    . cholecalciferol (VITAMIN D) 1000 units tablet Take 2,000 Units by mouth daily.    . fexofenadine (ALLEGRA) 180 MG tablet Take 180 mg by mouth daily.    . Probiotic Product (PROBIOTIC DAILY PO) Take by mouth.    . Vitamin D, Ergocalciferol, (DRISDOL) 50000 units CAPS capsule Take 1 capsule (50,000 Units total) by mouth every 7 (seven) days. (Patient not taking: Reported on 09/12/2018) 12 capsule 3  . zolpidem (AMBIEN) 10 MG tablet 1/2-1 tablet po  prn QHS (Patient not taking: Reported on 09/12/2018) 15 tablet 0   No current facility-administered medications on file prior to visit.    ROS as in subjective   Objective: BP 122/70   Pulse 88   Temp 98.1 F (36.7 C) (Oral)   Resp 16   Ht 5\' 7"  (1.702 m)   Wt 183 lb 3.2 oz (83.1 kg)   SpO2 97%   BMI 28.69 kg/m   General appearance: alert, no distress, WD/WN,  Heart: RRR, normal S1, S2, no murmurs Pulses: 2+ symmetric, upper and lower extremities, normal cap refill Ext: no edema    Assessment: Encounter Diagnoses  Name Primary?  . Palpitation Yes  . Essential hypertension     Plan: BP look good, continue amlodipine 5 mg daily.  Recommendations:  Check your blood pressures 4-5 days per week at different times of day, record these and get them to me in a months  Goal blood pressure should be 130/80 or less.   Low BP would be <100/60  If you feel a palpitation or chest pain, measure your pulse rate and write this down  Resting pulse should be between 70-100 bpm   Carolynne was seen today for follow up.  Diagnoses and all orders for this visit:  Palpitation  Essential hypertension

## 2018-10-31 DIAGNOSIS — H5319 Other subjective visual disturbances: Secondary | ICD-10-CM | POA: Diagnosis not present

## 2018-12-11 DIAGNOSIS — Z124 Encounter for screening for malignant neoplasm of cervix: Secondary | ICD-10-CM | POA: Diagnosis not present

## 2018-12-11 DIAGNOSIS — Z6827 Body mass index (BMI) 27.0-27.9, adult: Secondary | ICD-10-CM | POA: Diagnosis not present

## 2018-12-11 DIAGNOSIS — Z1231 Encounter for screening mammogram for malignant neoplasm of breast: Secondary | ICD-10-CM | POA: Diagnosis not present

## 2018-12-11 DIAGNOSIS — Z01419 Encounter for gynecological examination (general) (routine) without abnormal findings: Secondary | ICD-10-CM | POA: Diagnosis not present

## 2018-12-11 LAB — HM PAP SMEAR: HM Pap smear: NORMAL

## 2018-12-11 LAB — RESULTS CONSOLE HPV: CHL HPV: POSITIVE

## 2018-12-19 ENCOUNTER — Encounter: Payer: Self-pay | Admitting: Medical

## 2018-12-19 ENCOUNTER — Ambulatory Visit: Payer: BLUE CROSS/BLUE SHIELD | Admitting: Medical

## 2018-12-19 VITALS — BP 110/70 | HR 80 | Temp 98.2°F | Resp 16 | Ht 67.0 in | Wt 176.2 lb

## 2018-12-19 DIAGNOSIS — Z2821 Immunization not carried out because of patient refusal: Secondary | ICD-10-CM

## 2018-12-19 DIAGNOSIS — Z975 Presence of (intrauterine) contraceptive device: Secondary | ICD-10-CM | POA: Diagnosis not present

## 2018-12-19 DIAGNOSIS — J301 Allergic rhinitis due to pollen: Secondary | ICD-10-CM

## 2018-12-19 DIAGNOSIS — K5900 Constipation, unspecified: Secondary | ICD-10-CM

## 2018-12-19 DIAGNOSIS — R002 Palpitations: Secondary | ICD-10-CM

## 2018-12-19 DIAGNOSIS — E559 Vitamin D deficiency, unspecified: Secondary | ICD-10-CM | POA: Diagnosis not present

## 2018-12-19 DIAGNOSIS — Z0000 Encounter for general adult medical examination without abnormal findings: Secondary | ICD-10-CM

## 2018-12-19 DIAGNOSIS — I10 Essential (primary) hypertension: Secondary | ICD-10-CM

## 2018-12-19 LAB — POCT URINALYSIS DIP (PROADVANTAGE DEVICE)
BILIRUBIN UA: NEGATIVE mg/dL
Bilirubin, UA: NEGATIVE
Glucose, UA: NEGATIVE mg/dL
Leukocytes, UA: NEGATIVE
Nitrite, UA: NEGATIVE
PROTEIN UA: NEGATIVE mg/dL
RBC UA: NEGATIVE
SPECIFIC GRAVITY, URINE: 1.02
UUROB: NEGATIVE
pH, UA: 6 (ref 5.0–8.0)

## 2018-12-19 NOTE — Progress Notes (Signed)
Subjective:   HPI  Christine Gardner is a 43 y.o. female who presents for a complete physical.  Preventative care: [facility] Dr. Joni Reining, dentist  [facility], Dr. Tery Sanfilippo, Camelia Eng, PA-C here for primary care   Concerns: Feeling some palpitations of late despite BP and pulse looking fine with home readings.   declines flu shot  Reviewed their medical, surgical, family, social, medication, and allergy history and updated chart as appropriate.  Past Medical History:  Diagnosis Date  . 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 Constipation   . H/O mammogram 2012   benign, eval due to right breast lump  . Hypertension 2016  . IUD (intrauterine device) in place 05/2015   Mirena  . Migraine   . Routine gynecological examination    [name], [place]  . Seasonal allergic rhinitis    allergy testing 06/2013 with [place]  . Type A blood, Rh negative   . Vitamin D deficiency     Past Surgical History:  Procedure Laterality Date  . COLONOSCOPY  12/2015   polyp.   [place], Dr. Doristine Devoid  . COLPOSCOPY  2013   LGSIL, f/u q2mo as of 6/14  . TOE SURGERY     hammer toe 4th, 5th, bilat    Social History   Socioeconomic History  . Marital status: Married    Spouse name: Not on file  . Number of children: Not on file  . Years of education: Not on file  . Highest education level: Not on file  Occupational History  . Not on file  Social Needs  . Financial resource strain: Not on file  . Food insecurity:    Worry: Not on file    Inability: Not on file  . Transportation needs:    Medical: Not on file    Non-medical: Not on file  Tobacco Use  . Smoking status: Never Smoker  . Smokeless tobacco: Never Used  Substance and Sexual Activity  . Alcohol use: Yes    Alcohol/week: 2.0 standard drinks    Types: 2 Glasses of wine per week  . Drug use: No  . Sexual activity: Yes    Birth control/protection: I.U.D.    Comment: mirena /vas   Lifestyle  . Physical activity:    Days per week: Not on file    Minutes per session: Not on file  . Stress: Not on file  Relationships  . Social connections:    Talks on phone: Not on file    Gets together: Not on file    Attends religious service: Not on file    Active member of club or organization: Not on file    Attends meetings of clubs or organizations: Not on file    Relationship status: Not on file  . Intimate partner violence:    Fear of current or ex partner: Not on file    Emotionally abused: Not on file    Physically abused: Not on file    Forced sexual activity: Not on file  Other Topics Concern  . Not on file  Social History Narrative   Divorced, remarried 08/2018.   43yo, 21yo, 16yo children, 1 grandchild.  Exercise 3-4 days per week with treadmill, elliptical, weights.  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  08/2017 opened up [employer].   Christian.  As of 12/2018    Family History  Problem Relation Age of Onset  . Hyperlipidemia Mother   .  Migraines Mother   . Hypertension Mother   . Diabetes Mother   . Heart disease Father 22       died of MI  . Other Father        quesionable drug abuse  . Hypertension Sister   . Hypertension Brother   . Hypertension Sister   . Hyperlipidemia Maternal Grandmother   . Diabetes Maternal Grandmother   . Heart disease Sister        CHF due to hyperthyroid  . Hyperthyroidism Sister   . Cancer Maternal Aunt        breast  . Cancer Maternal Aunt        breast  . Stroke Neg Hx   . Colon cancer Neg Hx      Current Outpatient Medications:  .  amLODipine (NORVASC) 5 MG tablet, Take 1 tablet (5 mg total) by mouth daily., Disp: 90 tablet, Rfl: 3 .  levonorgestrel (MIRENA) 20 MCG/24HR IUD, 1 each by Intrauterine route once., Disp: , Rfl:  .  Probiotic Product (PROBIOTIC DAILY PO), Take by mouth., Disp: , Rfl:  .  Bioflavonoid Products (VITAMIN C) CHEW, Chew by mouth., Disp: ,  Rfl:  .  cholecalciferol (VITAMIN D) 1000 units tablet, Take 2,000 Units by mouth daily., Disp: , Rfl:  .  fexofenadine (ALLEGRA) 180 MG tablet, Take 180 mg by mouth daily., Disp: , Rfl:   Allergies  Allergen Reactions  . Flagyl [Metronidazole] Anaphylaxis  . Vicodin [Hydrocodone-Acetaminophen]     itching  . Percocet [Oxycodone-Acetaminophen] Itching  . Tramadol Itching   Review of Systems Constitutional: -fever, -chills, -sweats, -unexpected weight change, -decreased appetite, -fatigue Allergy: -sneezing, -itching, -congestion Dermatology: -changing moles, --rash, -lumps ENT: -runny nose, -ear pain, -sore throat, -hoarseness, -sinus pain, -teeth pain, - ringing in ears, -hearing loss, -nosebleeds Cardiology: -chest pain, +palpitations, -swelling, -difficulty breathing when lying flat, -waking up short of breath Respiratory: -cough, -shortness of breath, -difficulty breathing with exercise or exertion, -wheezing, -coughing up blood Gastroenterology: -abdominal pain, -nausea, -vomiting, -diarrhea, -constipation, -blood in stool, -changes in bowel movement, -difficulty swallowing or eating Hematology: -bleeding, -bruising  Musculoskeletal: -joint aches, -muscle aches, -joint swelling, -back pain, -neck pain, -cramping, -changes in gait Ophthalmology: denies vision changes, eye redness, itching, discharge Urology: -burning with urination, -difficulty urinating, -blood in urine, -urinary frequency, -urgency, -incontinence Neurology: -headache, -weakness, -tingling, -numbness, -memory loss, -falls, +dizziness Psychology: -depressed mood, -agitation, +sleep problems     Objective:   Physical Exam  BP 110/70   Pulse 80   Temp 98.2 F (36.8 C) (Oral)   Resp 16   Ht 5\' 7"  (1.702 m)   Wt 176 lb 3.2 oz (79.9 kg)   SpO2 99%   BMI 27.60 kg/m   BP Readings from Last 3 Encounters:  12/19/18 110/70  10/20/18 122/70  09/12/18 (!) 150/90   General appearance: alert, no distress, WD/WN,  pleasant AA female  Skin: tattoo right lower flank/abdomen, tattoo upper back, tattoo lower half of back, no worrisome lesions  HEENT: normocephalic, conjunctiva/corneas normal, sclerae anicteric, PERRLA, EOMi, nares patent, no discharge or erythema, pharynx normal Oral cavity: MMM, tongue normal, teeth in good repair  Neck: supple, no lymphadenopathy, no thyromegaly, no masses, normal ROM  Heart: RRR, normal S1, S2, no murmurs  Lungs: CTA bilaterally, no wheezes, rhonchi, or rales  Abdomen: +bs, soft, non tender, non distended, no masses, no hepatomegaly, no splenomegaly, no bruits  Back: non tender, normal ROM, no scoliosis  Musculoskeletal: upper extremities non tender, no obvious deformity,  normal ROM throughout, lower extremities non tender, no obvious deformity, normal ROM throughout  Extremities: no edema, no cyanosis, no clubbing  Pulses: 2+ symmetric, upper and lower extremities, normal cap refill  Neurological: alert, oriented x 3, CN2-12 intact, strength normal upper extremities and lower extremities, sensation normal throughout, DTRs 2+ throughout, no cerebellar signs, gait normal  Psychiatric: normal affect, behavior normal, pleasant  Breast/gyn/rectal - deferred to gynecology   Assessment and Plan :    Encounter Diagnoses  Name Primary?  . Routine general medical examination at a health care facility Yes  . Encounter for health maintenance examination in adult   . Vitamin D deficiency   . Palpitation   . IUD (intrauterine device) in place   . Essential hypertension   . Allergic rhinitis due to pollen, unspecified seasonality   . Constipation, unspecified constipation type   . Influenza vaccination declined     Physical exam - discussed healthy lifestyle, diet, exercise, preventative care, vaccinations, and addressed their concerns. See your eye doctor yearly for routine vision care. See your dentist yearly for routine dental care including hygiene visits  twice yearly. See your gynecologist yearly for routine gynecological care.   HTN and palpitations - BP controlled on amlodipine, but still having some palpitations.  If labs normal, referral to Dr. Skeet Latch, cardiology  Follow-up pending labs  Gelila was seen today for cpe.  Diagnoses and all orders for this visit:  Routine general medical examination at a health care facility -     POCT Urinalysis DIP (Proadvantage Device) -     Thyroid Stimulating Immunoglobulin -     Comprehensive metabolic panel -     CBC -     TSH -     T4, Free -     T3 -     Lipid panel; Future  Encounter for health maintenance examination in adult  Vitamin D deficiency  Palpitation -     Thyroid Stimulating Immunoglobulin -     Comprehensive metabolic panel -     CBC -     TSH -     T4, Free -     T3  IUD (intrauterine device) in place  Essential hypertension  Allergic rhinitis due to pollen, unspecified seasonality  Constipation, unspecified constipation type  Influenza vaccination declined

## 2018-12-19 NOTE — Patient Instructions (Signed)
Thanks for trusting [place] with your health care and for coming in for a physical today.  Below are some general recommendations I have for you:  Yearly screenings See your eye doctor yearly for routine vision care. See your dentist yearly for routine dental care including hygiene visits twice yearly. See me here yearly for a routine physical and preventative care visit    Please follow up yearly for a physical.    I have included other useful information below for your review.  Preventative Care for Adults - Female      MAINTAIN REGULAR HEALTH EXAMS:  A routine yearly physical is a good way to check in with your primary care provider about your health and preventive screening. It is also an opportunity to share updates about your health and any concerns you have, and receive a thorough all-over exam.   Most health insurance companies pay for at least some preventative services.  Check with your health plan for specific coverages.  WHAT PREVENTATIVE SERVICES DO WOMEN NEED?  Adult women should have their weight and blood pressure checked regularly.   Women age 55 and older should have their cholesterol levels checked regularly.  Women should be screened for cervical cancer with a Pap smear and pelvic exam beginning at either age 92, or 3 years after they become sexually activity.    Breast cancer screening generally begins at age 14 with a mammogram and breast exam by your primary care provider.    Beginning at age 62 and continuing to age 34, women should be screened for colorectal cancer.  Certain people may need continued testing until age 63.  Updating vaccinations is part of preventative care.  Vaccinations help protect against diseases such as the flu.  Osteoporosis is a disease in which the bones lose minerals and strength as we age. Women ages 57 and over should discuss this with their caregivers, as should women after menopause who have other risk factors.  Lab tests are  generally done as part of preventative care to screen for anemia and blood disorders, to screen for problems with the kidneys and liver, to screen for bladder problems, to check blood sugar, and to check your cholesterol level.  Preventative services generally include counseling about diet, exercise, avoiding tobacco, drugs, excessive alcohol consumption, and sexually transmitted infections.    GENERAL RECOMMENDATIONS FOR GOOD HEALTH:  Healthy diet:  Eat a variety of foods, including fruit, vegetables, animal or vegetable protein, such as meat, fish, chicken, and eggs, or beans, lentils, tofu, and grains, such as rice.  Drink plenty of water daily.  Decrease saturated fat in the diet, avoid lots of red meat, processed foods, sweets, fast foods, and fried foods.  Exercise:  Aerobic exercise helps maintain good heart health. At least 30-40 minutes of moderate-intensity exercise is recommended. For example, a brisk walk that increases your heart rate and breathing. This should be done on most days of the week.   Find a type of exercise or a variety of exercises that you enjoy so that it becomes a part of your daily life.  Examples are running, walking, swimming, water aerobics, and biking.  For motivation and support, explore group exercise such as aerobic class, spin class, Zumba, Yoga,or  martial arts, etc.    Set exercise goals for yourself, such as a certain weight goal, walk or run in a race such as a 5k walk/run.  Speak to your primary care provider about exercise goals.  Disease prevention:  If  you smoke or chew tobacco, find out from your caregiver how to quit. It can literally save your life, no matter how long you have been a tobacco user. If you do not use tobacco, never begin.   Maintain a healthy diet and normal weight. Increased weight leads to problems with blood pressure and diabetes.   The Body Mass Index or BMI is a way of measuring how much of your body is fat. Having a  BMI above 27 increases the risk of heart disease, diabetes, hypertension, stroke and other problems related to obesity. Your caregiver can help determine your BMI and based on it develop an exercise and dietary program to help you achieve or maintain this important measurement at a healthful level.  High blood pressure causes heart and blood vessel problems.  Persistent high blood pressure should be treated with medicine if weight loss and exercise do not work.   Fat and cholesterol leaves deposits in your arteries that can block them. This causes heart disease and vessel disease elsewhere in your body.  If your cholesterol is found to be high, or if you have heart disease or certain other medical conditions, then you may need to have your cholesterol monitored frequently and be treated with medication.   Ask if you should have a cardiac stress test if your history suggests this. A stress test is a test done on a treadmill that looks for heart disease. This test can find disease prior to there being a problem.  Menopause can be associated with physical symptoms and risks. Hormone replacement therapy is available to decrease these. You should talk to your caregiver about whether starting or continuing to take hormones is right for you.   Osteoporosis is a disease in which the bones lose minerals and strength as we age. This can result in serious bone fractures. Risk of osteoporosis can be identified using a bone density scan. Women ages 17 and over should discuss this with their caregivers, as should women after menopause who have other risk factors. Ask your caregiver whether you should be taking a calcium supplement and Vitamin D, to reduce the rate of osteoporosis.   Avoid drinking alcohol in excess (more than two drinks per day).  Avoid use of street drugs. Do not share needles with anyone. Ask for professional help if you need assistance or instructions on stopping the use of alcohol, cigarettes,  and/or drugs.  Brush your teeth twice a day with fluoride toothpaste, and floss once a day. Good oral hygiene prevents tooth decay and gum disease. The problems can be painful, unattractive, and can cause other health problems. Visit your dentist for a routine oral and dental check up and preventive care every 6-12 months.   Look at your skin regularly.  Use a mirror to look at your back. Notify your caregivers of changes in moles, especially if there are changes in shapes, colors, a size larger than a pencil eraser, an irregular border, or development of new moles.  Safety:  Use seatbelts 100% of the time, whether driving or as a passenger.  Use safety devices such as hearing protection if you work in environments with loud noise or significant background noise.  Use safety glasses when doing any work that could send debris in to the eyes.  Use a helmet if you ride a bike or motorcycle.  Use appropriate safety gear for contact sports.  Talk to your caregiver about gun safety.  Use sunscreen with a SPF (or skin protection  factor) of 15 or greater.  Lighter skinned people are at a greater risk of skin cancer. Don't forget to also wear sunglasses in order to protect your eyes from too much damaging sunlight. Damaging sunlight can accelerate cataract formation.   Practice safe sex. Use condoms. Condoms are used for birth control and to help reduce the spread of sexually transmitted infections (or STIs).  Some of the STIs are gonorrhea (the clap), chlamydia, syphilis, trichomonas, herpes, HPV (human papilloma virus) and HIV (human immunodeficiency virus) which causes AIDS. The herpes, HIV and HPV are viral illnesses that have no cure. These can result in disability, cancer and death.   Keep carbon monoxide and smoke detectors in your home functioning at all times. Change the batteries every 6 months or use a model that plugs into the wall.   Vaccinations:  Stay up to date with your tetanus shots and  other required immunizations. You should have a booster for tetanus every 10 years. Be sure to get your flu shot every year, since 5%-20% of the [place] population comes down with the flu. The flu vaccine changes each year, so being vaccinated once is not enough. Get your shot in the fall, before the flu season peaks.   Other vaccines to consider:  Human Papilloma Virus or HPV causes cancer of the cervix, and other infections that can be transmitted from person to person. There is a vaccine for HPV, and females should get immunized between the ages of 55 and 49. It requires a series of 3 shots.   Pneumococcal vaccine to protect against certain types of pneumonia.  This is normally recommended for adults age 13 or older.  However, adults younger than 44 years old with certain underlying conditions such as diabetes, heart or lung disease should also receive the vaccine.  Shingles vaccine to protect against Varicella Zoster if you are older than age 29, or younger than 44 years old with certain underlying illness.  If you have not had the Shingrix vaccine, please call your insurer to inquire about coverage for the Shingrix vaccine given in 2 doses.   Some insurers cover this vaccine after age 12, some cover this after age 60.  If your insurer covers this, then call to schedule appointment to have this vaccine here  Hepatitis A vaccine to protect against a form of infection of the liver by a virus acquired from food.  Hepatitis B vaccine to protect against a form of infection of the liver by a virus acquired from blood or body fluids, particularly if you work in health care.  If you plan to travel internationally, check with your local health department for specific vaccination recommendations.  Cancer Screening:  Breast cancer screening is essential to preventive care for women. All women age 49 and older should perform a breast self-exam every month. At age 59 and older, women should have their caregiver  complete a breast exam each year. Women at ages 36 and older should have a mammogram (x-ray film) of the breasts. Your caregiver can discuss how often you need mammograms.    Cervical cancer screening includes taking a Pap smear (sample of cells examined under a microscope) from the cervix (end of the uterus). It also includes testing for HPV (Human Papilloma Virus, which can cause cervical cancer). Screening and a pelvic exam should begin at age 61, or 3 years after a woman becomes sexually active. Screening should occur every year, with a Pap smear but no HPV testing, up to  age 61. After age 45, you should have a Pap smear every 3 years with HPV testing, if no HPV was found previously.   Most routine colon cancer screening begins at the age of 1. On a yearly basis, doctors may provide special easy to use take-home tests to check for hidden blood in the stool. Sigmoidoscopy or colonoscopy can detect the earliest forms of colon cancer and is life saving. These tests use a small camera at the end of a tube to directly examine the colon. Speak to your caregiver about this at age 67, when routine screening begins (and is repeated every 5 years unless early forms of pre-cancerous polyps or small growths are found).

## 2018-12-20 ENCOUNTER — Other Ambulatory Visit: Payer: Self-pay

## 2018-12-20 DIAGNOSIS — R002 Palpitations: Secondary | ICD-10-CM

## 2018-12-20 LAB — COMPREHENSIVE METABOLIC PANEL
A/G RATIO: 1.4 (ref 1.2–2.2)
ALT: 15 IU/L (ref 0–32)
AST: 16 IU/L (ref 0–40)
Albumin: 4.6 g/dL (ref 3.5–5.5)
Alkaline Phosphatase: 83 IU/L (ref 39–117)
BILIRUBIN TOTAL: 1 mg/dL (ref 0.0–1.2)
BUN/Creatinine Ratio: 12 (ref 9–23)
BUN: 10 mg/dL (ref 6–24)
CHLORIDE: 105 mmol/L (ref 96–106)
CO2: 22 mmol/L (ref 20–29)
Calcium: 9.9 mg/dL (ref 8.7–10.2)
Creatinine, Ser: 0.84 mg/dL (ref 0.57–1.00)
GFR, EST AFRICAN AMERICAN: 98 mL/min/{1.73_m2} (ref >59)
GFR, EST NON AFRICAN AMERICAN: 85 mL/min/{1.73_m2} (ref >59)
GLOBULIN, TOTAL: 3.2 g/dL (ref 1.5–4.5)
Glucose: 94 mg/dL (ref 65–99)
POTASSIUM: 4.6 mmol/L (ref 3.5–5.2)
SODIUM: 140 mmol/L (ref 134–144)
TOTAL PROTEIN: 7.8 g/dL (ref 6.0–8.5)

## 2018-12-20 LAB — CBC
Hematocrit: 39.2 % (ref 34.0–46.6)
Hemoglobin: 13.9 g/dL (ref 11.1–15.9)
MCH: 34.3 pg — AB (ref 26.6–33.0)
MCHC: 35.5 g/dL (ref 31.5–35.7)
MCV: 97 fL (ref 79–97)
PLATELETS: 274 10*3/uL (ref 150–450)
RBC: 4.05 x10E6/uL (ref 3.77–5.28)
RDW: 12 % (ref 11.7–15.4)
WBC: 5.6 10*3/uL (ref 3.4–10.8)

## 2018-12-20 LAB — T3: T3, Total: 86 ng/dL (ref 71–180)

## 2018-12-20 LAB — TSH: TSH: 1.54 u[IU]/mL (ref 0.450–4.500)

## 2018-12-20 LAB — T4, FREE: FREE T4: 1.1 ng/dL (ref 0.82–1.77)

## 2018-12-20 LAB — THYROID STIMULATING IMMUNOGLOBULIN

## 2018-12-26 LAB — LIPID PANEL
Chol/HDL Ratio: 3.4 ratio (ref 0.0–4.4)
Cholesterol, Total: 195 mg/dL (ref 100–199)
HDL: 58 mg/dL (ref >39)
LDL Calculated: 124 mg/dL — ABNORMAL HIGH (ref 0–99)
Triglycerides: 64 mg/dL (ref 0–149)
VLDL CHOLESTEROL CAL: 13 mg/dL (ref 5–40)

## 2019-01-02 DIAGNOSIS — F4322 Adjustment disorder with anxiety: Secondary | ICD-10-CM | POA: Diagnosis not present

## 2019-01-02 DIAGNOSIS — F4310 Post-traumatic stress disorder, unspecified: Secondary | ICD-10-CM | POA: Diagnosis not present

## 2019-01-16 DIAGNOSIS — F4322 Adjustment disorder with anxiety: Secondary | ICD-10-CM | POA: Diagnosis not present

## 2019-01-16 DIAGNOSIS — F4310 Post-traumatic stress disorder, unspecified: Secondary | ICD-10-CM | POA: Diagnosis not present

## 2019-01-22 DIAGNOSIS — F4322 Adjustment disorder with anxiety: Secondary | ICD-10-CM | POA: Diagnosis not present

## 2019-01-22 DIAGNOSIS — F4310 Post-traumatic stress disorder, unspecified: Secondary | ICD-10-CM | POA: Diagnosis not present

## 2019-02-07 ENCOUNTER — Encounter: Payer: Self-pay | Admitting: Cardiovascular Disease

## 2019-02-07 ENCOUNTER — Ambulatory Visit: Payer: BLUE CROSS/BLUE SHIELD | Admitting: Cardiovascular Disease

## 2019-02-07 VITALS — BP 120/84 | HR 76 | Ht 67.0 in | Wt 183.2 lb

## 2019-02-07 DIAGNOSIS — I10 Essential (primary) hypertension: Secondary | ICD-10-CM | POA: Diagnosis not present

## 2019-02-07 DIAGNOSIS — R002 Palpitations: Secondary | ICD-10-CM

## 2019-02-07 NOTE — Progress Notes (Signed)
Cardiology Office Note   Date:  02/07/2019   ID:  Alainna Stawicki, DOB June 06, 1976, MRN [id number]  PCP:  Carlena Hurl, PA-C  Cardiologist:   Skeet Latch, MD   No chief complaint on file.     History of Present Illness: Christine Gardner is a 43 y.o. female with hypertension who is being seen today for the evaluation of palpitations at the request of Carlena Hurl, PA-C.  Christine Gardner has experienced palpitations for years.  This occurred in 2016 and she wore an ambulatory monitor.  During that time she pressed the button multiple times but no arrhythmias were noted.  Since then it seems to be getting a lot worse.  In the week before Christmas 2019 she had palpitations that were incessant for approximately a week.  When it happens she feels like her heart is going to jump out of her chest.  There is no associated lightheadedness or dizziness.  It also is not associated with chest pain.  She denies any lower extremity edema, orthopnea, or PND.  At the time she checked her blood pressure and it was very elevated.  She was started on amlodipine and her blood pressure has been better-controlled.  Her palpitations have been better but she continues to have them frequently.  She works out with a Clinical research associate 2 days/week for approximately 1 hour.  She does cardio, lifts weights, and does body weight exercises.  She does not have palpitations during this time.  The symptoms seem to occur randomly.  It occurs when she is driving or at rest.  It is not related to caffeine.  She typically drinks 1 cup of coffee daily.  She does not use any over-the-counter cold or cough medications.  She previously used several over-the-counter supplements.  Since discontinuing them she has not noted any changes.  Christine Gardner saw Mr. Tysinger, PA-C on 12/19/18 and reported palpitations.  She had labs at that time that revealed normal electrolytes, blood counts, and thyroid function.  She was referred to cardiology for further  evaluation.   Past Medical History:  Diagnosis Date  . 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 Constipation   . H/O mammogram 2012   benign, eval due to right breast lump  . Hypertension 2016  . IUD (intrauterine device) in place 05/2015   Mirena  . Migraine   . Routine gynecological examination    [name], [place]  . Seasonal allergic rhinitis    allergy testing 06/2013 with [facility]  . Type A blood, Rh negative   . Vitamin D deficiency     Past Surgical History:  Procedure Laterality Date  . COLONOSCOPY  12/2015   polyp.   [place], Dr. Doristine Devoid  . COLPOSCOPY  2013   LGSIL, f/u q81mo as of 6/14  . TOE SURGERY     hammer toe 4th, 5th, bilat     Current Outpatient Medications  Medication Sig Dispense Refill  . amLODipine (NORVASC) 5 MG tablet Take 1 tablet (5 mg total) by mouth daily. 90 tablet 3  . Bioflavonoid Products (VITAMIN C) CHEW Chew by mouth.    . cholecalciferol (VITAMIN D) 1000 units tablet Take 2,000 Units by mouth daily.    . fexofenadine (ALLEGRA) 180 MG tablet Take 180 mg by mouth daily.    Marland Kitchen levonorgestrel (MIRENA) 20 MCG/24HR IUD 1 each by Intrauterine route once.    . Probiotic Product (PROBIOTIC DAILY PO) Take by mouth.  No current facility-administered medications for this visit.     Allergies:   Flagyl [metronidazole]; Vicodin [hydrocodone-acetaminophen]; Percocet [oxycodone-acetaminophen]; and Tramadol    Social History:  The patient  reports that she has never smoked. She has never used smokeless tobacco. She reports current alcohol use of about 2.0 standard drinks of alcohol per week. She reports that she does not use drugs.   Family History:  The patient's family history includes Cancer in her maternal aunt and maternal aunt; Diabetes in her maternal grandmother and mother; Heart disease in her sister; Heart disease (age of onset: 41) in her father; Hyperlipidemia in her maternal grandmother and mother;  Hypertension in her brother, mother, sister, and sister; Hyperthyroidism in her sister; Migraines in her mother; Other in her father.    ROS:  Please see the history of present illness.   Otherwise, review of systems are positive for none.   All other systems are reviewed and negative.    PHYSICAL EXAM: VS:  BP 120/84   Pulse 76   Ht 5\' 7"  (1.702 m)   Wt 183 lb 3.2 oz (83.1 kg)   SpO2 99%   BMI 28.69 kg/m  , BMI Body mass index is 28.69 kg/m. GENERAL:  Well appearing HEENT:  Pupils equal round and reactive, fundi not visualized, oral mucosa unremarkable NECK:  No jugular venous distention, waveform within normal limits, carotid upstroke brisk and symmetric, no bruits LUNGS:  Clear to auscultation bilaterally HEART:  RRR.  PMI not displaced or sustained,S1 and S2 within normal limits, no S3, no S4, no clicks, no rubs, no murmurs ABD:  Flat, positive bowel sounds normal in frequency in pitch, no bruits, no rebound, no guarding, no midline pulsatile mass, no hepatomegaly, no splenomegaly EXT:  2 plus pulses throughout, no edema, no cyanosis no clubbing SKIN:  No rashes no nodules NEURO:  Cranial nerves II through XII grossly intact, motor grossly intact throughout PSYCH:  Cognitively intact, oriented to person place and time   EKG:  EKG is ordered today. The ekg ordered today demonstrates sinus rhythm.  Rate 76 bpm.   Recent Labs: 12/19/2018: ALT 15; BUN 10; Creatinine, Ser 0.84; Hemoglobin 13.9; Platelets 274; Potassium 4.6; Sodium 140; TSH 1.540    Lipid Panel    Component Value Date/Time   CHOL 195 12/19/2018 1015   TRIG 64 12/19/2018 1015   HDL 58 12/19/2018 1015   CHOLHDL 3.4 12/19/2018 1015   CHOLHDL 2.7 06/11/2015 0001   VLDL 13 06/11/2015 0001   LDLCALC 124 (H) 12/19/2018 1015      Wt Readings from Last 3 Encounters:  02/07/19 183 lb 3.2 oz (83.1 kg)  12/19/18 176 lb 3.2 oz (79.9 kg)  10/20/18 183 lb 3.2 oz (83.1 kg)      ASSESSMENT AND PLAN:  #  Palpitations:  Her episodes seem to occur randomly but for hours at a time.  This makes PACs/PVCs less likely.  However there are no associated symptoms and it is non-exertional.  It was not previously captured on ambulatory monitoring but seems to be worse now.  We will try to capture on a 30 day Event Monitor.  She already had the appropriate lab testing, which was unremarkable.   # Hypertension: BP controlled on amlodipine.  If she has any arrhythmia consider adding metoprolol succinate vs switching to carvedilol.   Current medicines are reviewed at length with the patient today.  The patient does not have concerns regarding medicines.  The following changes have been made:  no change  Labs/ tests ordered today include:   Orders Placed This Encounter  Procedures  . CARDIAC EVENT MONITOR  . EKG 12-Lead     Disposition:   FU with Katia Hannen C. Oval Linsey, MD, [facility] as needed.  APP in 2 months.      Signed, Taijuan Serviss C. Oval Linsey, MD, [facility]  02/07/2019 11:22 AM    Carlos

## 2019-02-07 NOTE — Patient Instructions (Addendum)
Medication Instructions:  Your physician recommends that you continue on your current medications as directed. Please refer to the Current Medication list given to you today.  If you need a refill on your cardiac medications before your next appointment, please call your pharmacy.   Lab work: NONE  Testing/Procedures: Your physician has recommended that you wear an event monitor. Event monitors are medical devices that record the heart's electrical activity. Doctors most often [place] these monitors to diagnose arrhythmias. Arrhythmias are problems with the speed or rhythm of the heartbeat. The monitor is a small, portable device. You can wear one while you do your normal daily activities. This is usually used to diagnose what is causing palpitations/syncope (passing out). [place]  Follow-Up: Your physician recommends that you schedule a follow-up appointment in: 2 MONTHS WITH PA/NP  FOLLOW UP AS NEEDED WITH DR [facility]    Ambulatory Cardiac Monitoring An ambulatory cardiac monitor is a small recording device that is used to detect abnormal heart rhythms (arrhythmias). Most monitors are connected by wires to flat, sticky disks (electrodes) that are then attached to your chest. You may need to wear a monitor if you have had symptoms such as:  Fast heartbeats (palpitations).  Dizziness.  Fainting or light-headedness.  Unexplained weakness.  Shortness of breath. There are several types of monitors. Some common monitors include:  Holter monitor. This records your heart rhythm continuously, usually for 24-48 hours.  Event (episodic) monitor. This monitor has a symptoms button, and when pushed, it will begin recording. You need to activate this monitor to record when you have a heart-related symptom.  Automatic detection monitor. This monitor will begin recording when it detects an abnormal heartbeat. What are the risks? Generally, these devices are safe  to use. However, it is possible that the skin under the electrodes will become irritated. How to prepare for monitoring Your health care provider will prepare your chest for the electrode placement and show you how to use the monitor.  Do not apply lotions to your chest before monitoring.  Follow directions on how to care for the monitor, and how to return the monitor when the testing period is complete. How to use your cardiac monitor  Follow directions about how long to wear the monitor, and if you can take the monitor off in order to shower or bathe. ? Do not let the monitor get wet. ? Do not bathe, swim, or use a hot tub while wearing the monitor.  Keep your skin clean. Do not put body lotion or moisturizer on your chest.  Change the electrodes as told by your health care provider, or any time they stop sticking to your skin. You may need to use medical tape to keep them on.  Try to put the electrodes in slightly different places on your chest to help prevent skin irritation. Follow directions from your health care provider about where to place the electrodes.  Make sure the monitor is safely clipped to your clothing or in a location close to your body as recommended by your health care provider.  If your monitor has a symptoms button, press the button to mark an event as soon as you feel a heart-related symptom, such as: ? Dizziness. ? Weakness. ? Light-headedness. ? Palpitations. ? Thumping or pounding in your chest. ? Shortness of breath. ? Unexplained weakness.  Keep a diary of your activities, such as walking, doing chores, and taking medicine. It is very important  to note what you were doing when you pushed the button to record your symptoms. This will help your health care provider determine what might be contributing to your symptoms.  Send the recorded information as recommended by your health care provider. It may take some time for your health care provider to process  the results.  Change the batteries as told by your health care provider.  Keep electronic devices away from your monitor. These include: ? Tablets. ? MP3 players. ? Cell phones.  While wearing your monitor you should avoid: ? Electric blankets. ? Armed forces operational officer. ? Electric toothbrushes. ? Microwave ovens. ? Magnets. ? Metal detectors. Get help right away if:  You have chest pain.  You have shortness of breath or extreme difficulty breathing.  You develop a very fast heartbeat that does not get better.  You develop dizziness that does not go away.  You faint or constantly feel like you are about to faint. Summary  An ambulatory cardiac monitor is a small recording device that is used to detect abnormal heart rhythms (arrhythmias).  Make sure you understand how to send the information from the monitor to your health care provider.  It is important to press the button on the monitor when you have any heart-related symptoms.  Keep a diary of your activities, such as walking, doing chores, and taking medicine. It is very important to note what you were doing when you pushed the button to record your symptoms. This will help your health care provider learn what might be causing your symptoms. This information is not intended to replace advice given to you by your health care provider. Make sure you discuss any questions you have with your health care provider. Document Released: 08/31/2008 Document Revised: 09/07/2017 Document Reviewed: 11/06/2016 Elsevier Interactive Patient Education  2019 [employer].

## 2019-02-12 DIAGNOSIS — F4322 Adjustment disorder with anxiety: Secondary | ICD-10-CM | POA: Diagnosis not present

## 2019-02-12 DIAGNOSIS — F4310 Post-traumatic stress disorder, unspecified: Secondary | ICD-10-CM | POA: Diagnosis not present

## 2019-02-14 ENCOUNTER — Other Ambulatory Visit: Payer: Self-pay

## 2019-02-14 ENCOUNTER — Ambulatory Visit (INDEPENDENT_AMBULATORY_CARE_PROVIDER_SITE_OTHER): Payer: BLUE CROSS/BLUE SHIELD

## 2019-02-14 DIAGNOSIS — R002 Palpitations: Secondary | ICD-10-CM | POA: Diagnosis not present

## 2019-02-15 ENCOUNTER — Telehealth: Payer: Self-pay | Admitting: Cardiovascular Disease

## 2019-02-15 NOTE — Telephone Encounter (Signed)
New Message   Lattie Haw with [employer] called to report and abnormal EKG but hung up when I placed her on hold.

## 2019-02-15 NOTE — Telephone Encounter (Signed)
New Message ° ° °Patient returning phone call about results. °

## 2019-02-15 NOTE — Telephone Encounter (Signed)
Discussed with Dr Claiborne Billings and will start Metoprolol 50 mg 1/2 to 1 tablet as needed. Left message to call back

## 2019-02-15 NOTE — Telephone Encounter (Signed)
Spoke with patient and she was working out at the time of event. She does work out Tuesday and Thursday and has a vigorous work out. Will discuss with DOD Dr Claiborne Billings.

## 2019-02-15 NOTE — Telephone Encounter (Addendum)
Spoke with [employer] who had not spoken with patient, left message. Patient had episode of SVT x 1 minute with HR of 190 and 187 at 9:18 am CT. Left message for patient to call back

## 2019-02-16 DIAGNOSIS — M9902 Segmental and somatic dysfunction of thoracic region: Secondary | ICD-10-CM | POA: Diagnosis not present

## 2019-02-16 DIAGNOSIS — M9903 Segmental and somatic dysfunction of lumbar region: Secondary | ICD-10-CM | POA: Diagnosis not present

## 2019-02-16 DIAGNOSIS — M9905 Segmental and somatic dysfunction of pelvic region: Secondary | ICD-10-CM | POA: Diagnosis not present

## 2019-02-16 DIAGNOSIS — M5441 Lumbago with sciatica, right side: Secondary | ICD-10-CM | POA: Diagnosis not present

## 2019-02-16 NOTE — Telephone Encounter (Signed)
Left message to call back  

## 2019-02-16 NOTE — Telephone Encounter (Signed)
OK thank you 

## 2019-02-16 NOTE — Telephone Encounter (Signed)
Follow up: ° ° °Patient returning call  ° ° ° °

## 2019-02-20 ENCOUNTER — Other Ambulatory Visit: Payer: Self-pay | Admitting: Medical

## 2019-03-20 ENCOUNTER — Telehealth: Payer: Self-pay | Admitting: *Deleted

## 2019-03-20 NOTE — Telephone Encounter (Signed)
-----   Message from Skeet Latch, MD sent at 03/20/2019 10:42 AM EDT ----- Monitor showed normal rhythm when she pressed the button but some fast heart rhythms when she was asymptomatic.  These aren't dangerous but can cause palpitations.  Try adding metoprolol tartrate 25mg  bid to see if this helps her palpitations.

## 2019-03-20 NOTE — Telephone Encounter (Signed)
Left message to call back  

## 2019-03-20 NOTE — Telephone Encounter (Signed)
Patient never called back, see phone note 4/14

## 2019-04-06 ENCOUNTER — Telehealth: Payer: Self-pay | Admitting: Cardiology

## 2019-04-06 NOTE — Telephone Encounter (Signed)
Tele visit/ smart phone/ consent/ pre reg completed

## 2019-04-09 ENCOUNTER — Telehealth (INDEPENDENT_AMBULATORY_CARE_PROVIDER_SITE_OTHER): Payer: BLUE CROSS/BLUE SHIELD | Admitting: Cardiology

## 2019-04-09 ENCOUNTER — Telehealth: Payer: Self-pay

## 2019-04-09 ENCOUNTER — Encounter: Payer: Self-pay | Admitting: Cardiology

## 2019-04-09 VITALS — Ht 67.0 in | Wt 175.0 lb

## 2019-04-09 DIAGNOSIS — I10 Essential (primary) hypertension: Secondary | ICD-10-CM | POA: Diagnosis not present

## 2019-04-09 DIAGNOSIS — I471 Supraventricular tachycardia: Secondary | ICD-10-CM

## 2019-04-09 DIAGNOSIS — R002 Palpitations: Secondary | ICD-10-CM | POA: Diagnosis not present

## 2019-04-09 MED ORDER — METOPROLOL SUCCINATE ER 25 MG PO TB24: 25.0000 mg | ORAL_TABLET | Freq: Every day | ORAL | 11 refills | Status: DC

## 2019-04-09 NOTE — Telephone Encounter (Signed)
Contacted patient to discuss AVS instructions. Patient voiced understanding. 

## 2019-04-09 NOTE — Patient Instructions (Addendum)
Medication Instructions:  START Metoprolol 25mg  Take 1 tablet once a day If you need a refill on your cardiac medications before your next appointment, please call your pharmacy.   Lab work: None  If you have labs (blood work) drawn today and your tests are completely normal, you will receive your results only by: Marland Kitchen MyChart Message (if you have MyChart) OR . A paper copy in the mail If you have any lab test that is abnormal or we need to change your treatment, we will call you to review the results.  Testing/Procedures: None   Follow-Up: At [facility], you and your health needs are our priority.  As part of our continuing mission to provide you with exceptional heart care, we have created designated Provider Care Teams.  These Care Teams include your primary Cardiologist (physician) and Advanced Practice Providers (APPs -  Physician Assistants and Nurse Practitioners) who all work together to provide you with the care you need, when you need it. You will need a follow up appointment in 6 months.  Please call our office 2 months in advance to schedule this appointment.  You may see Skeet Latch, MD or one of the following Advanced Practice Providers on your designated Care Team:   Kerin Ransom, PA-C Roby Lofts, [place] . Sande Rives, PA-C  Any Other Special Instructions Will Be Listed Below (If Applicable). AVOID CAFFEINE AND OVER THE COUNTER DECONGESTANTS CHECK YOUR BLOOD PRESSURE 3 TIMES A WEEK-GOAL BP IS 120/80 CONTACT THE OFFICE IF YOU HAVE SUSTAINED TACHYCARDIA LAST LONGER THAN 15-20 MINUTES

## 2019-04-09 NOTE — Progress Notes (Signed)
Virtual Visit via Video Note   This visit type was conducted due to national recommendations for restrictions regarding the COVID-19 Pandemic (e.g. social distancing) in an effort to limit this patient's exposure and mitigate transmission in our community.  Due to her co-morbid illnesses, this patient is at least at moderate risk for complications without adequate follow up.  This format is felt to be most appropriate for this patient at this time.  All issues noted in this document were discussed and addressed.  A limited physical exam was performed with this format.  Please refer to the patient's chart for her consent to telehealth for [facility].   Date:  04/09/2019   ID:  Christine Gardner, DOB 1976/11/22, MRN [id number]  Patient Location: Home Provider Location: Home  PCP:  Carlena Hurl, PA-C  Cardiologist:  Skeet Latch, MD  Electrophysiologist:  None   Evaluation Performed:  Follow-Up Visit  Chief Complaint:  palpitations  History of Present Illness:    Christine Gardner is a 43 y.o. female with a history of palpitations and hypertension.  A Holter monitor in March 2020 revealed runs of PSVT.  Interestingly these did not correlate with her symptoms of palpitations.  Dr. Oval Linsey is recommended a beta-blocker.  The patient called today to review her monitor results and was seen in a video visit.  She says she continues to have palpitations although it may vary from week to week.  She drinks 1 cup of coffee a day but otherwise does not use a lot of caffeine.  She takes Allegra over-the-counter.  I suggested we add a low-dose beta-blocker.  We also discussed hypertension.  I suggested she check her blood pressure 3 times a week, Monday Wednesday and Friday at different times of the day.  I explained our goal BP for her will be 120/80 or less.  She will follow-up with Dr. Oval Linsey in the Fall.  She knows to contact us if she has sustained tachycardia that last more than 15 or 20 minutes.   The patient does not have symptoms concerning for COVID-19 infection (fever, chills, cough, or new shortness of breath).    Past Medical History:  Diagnosis Date  . 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 Constipation   . H/O mammogram 2012   benign, eval due to right breast lump  . Hypertension 2016  . IUD (intrauterine device) in place 05/2015   Mirena  . Migraine   . Routine gynecological examination    [name], [place]  . Seasonal allergic rhinitis    allergy testing 06/2013 with [facility]  . Type A blood, Rh negative   . Vitamin D deficiency    Past Surgical History:  Procedure Laterality Date  . COLONOSCOPY  12/2015   polyp.   [place], Dr. Doristine Devoid  . COLPOSCOPY  2013   LGSIL, f/u q97mo as of 6/14  . TOE SURGERY     hammer toe 4th, 5th, bilat     Current Meds  Medication Sig  . amLODipine (NORVASC) 5 MG tablet Take 1 tablet (5 mg total) by mouth daily.  . fexofenadine (ALLEGRA) 180 MG tablet Take 180 mg by mouth daily.  Marland Kitchen levonorgestrel (MIRENA) 20 MCG/24HR IUD 1 each by Intrauterine route once.  . Probiotic Product (PROBIOTIC DAILY PO) Take by mouth.  . Vitamin D, Ergocalciferol, (DRISDOL) 1.25 MG (50000 UT) CAPS capsule TAKE 1 CAPSULE (50,000 UNITS TOTAL) BY MOUTH EVERY 7 (SEVEN) DAYS.     Allergies:  Flagyl [metronidazole]; Vicodin [hydrocodone-acetaminophen]; Percocet [oxycodone-acetaminophen]; and Tramadol   Social History   Tobacco Use  . Smoking status: Never Smoker  . Smokeless tobacco: Never Used  Substance Use Topics  . Alcohol use: Yes    Alcohol/week: 2.0 standard drinks    Types: 2 Glasses of wine per week  . Drug use: No     Family Hx: The patient's family history includes Cancer in her maternal aunt and maternal aunt; Diabetes in her maternal grandmother and mother; Heart disease in her sister; Heart disease (age of onset: 68) in her father; Hyperlipidemia in her maternal grandmother and mother;  Hypertension in her brother, mother, sister, and sister; Hyperthyroidism in her sister; Migraines in her mother; Other in her father. There is no history of Stroke or Colon cancer.  ROS:   Please see the history of present illness.    All other systems reviewed and are negative.   Prior CV studies:   The following studies were reviewed today:  Labs/Other Tests and Data Reviewed:    EKG:  No ECG reviewed.  Recent Labs: 12/19/2018: ALT 15; BUN 10; Creatinine, Ser 0.84; Hemoglobin 13.9; Platelets 274; Potassium 4.6; Sodium 140; TSH 1.540   Recent Lipid Panel Lab Results  Component Value Date/Time   CHOL 195 12/19/2018 10:15 AM   TRIG 64 12/19/2018 10:15 AM   HDL 58 12/19/2018 10:15 AM   CHOLHDL 3.4 12/19/2018 10:15 AM   CHOLHDL 2.7 06/11/2015 12:01 AM   LDLCALC 124 (H) 12/19/2018 10:15 AM    Wt Readings from Last 3 Encounters:  04/09/19 175 lb (79.4 kg)  02/07/19 183 lb 3.2 oz (83.1 kg)  12/19/18 176 lb 3.2 oz (79.9 kg)     Objective:    Vital Signs:  Ht 5\' 7"  (1.702 m)   Wt 175 lb (79.4 kg)   BMI 27.41 kg/m    VITAL SIGNS:  reviewed  ASSESSMENT & PLAN:    Palpitations H/O symptomatic palpations  PSVT- Documented on Holter but did not corollate with her symptoms   Essential HTN- On Amlodipine  COVID-19 Education: The signs and symptoms of COVID-19 were discussed with the patient and how to seek care for testing (follow up with PCP or arrange E-visit).  The importance of social distancing was discussed today.  Time:   Today, I have spent 15 minutes with the patient with telehealth technology discussing the above problems.     Medication Adjustments/Labs and Tests Ordered: Current medicines are reviewed at length with the patient today.  Concerns regarding medicines are outlined above.   Tests Ordered: No orders of the defined types were placed in this encounter.   Medication Changes: No orders of the defined types were placed in this encounter.    Disposition:  Follow up Dr Oval Linsey in Oct  Signed, Tiffaney Heimann, [place]  04/09/2019 9:49 AM    [place]

## 2019-04-11 ENCOUNTER — Telehealth: Payer: Self-pay | Admitting: Cardiovascular Disease

## 2019-04-11 NOTE — Telephone Encounter (Signed)
Agree, thanks  [employer]

## 2019-04-11 NOTE — Telephone Encounter (Signed)
  Pt c/o medication issue:  1. Name of Medication: metoprolol succinate (TOPROL XL) 25 MG 24 hr tablet  2. How are you currently taking this medication (dosage and times per day)? As directed  3. Are you having a reaction (difficulty breathing--STAT)? tachycardia  4. What is your medication issue? [place] prescribed patient this medication at her visit on 04/09/19. She states that she is having some tachycardia. Her blood pressure was 143/94 and she says she did not have any caffeine. She has concerns about continuing on this medication and would like to discuss

## 2019-04-11 NOTE — Telephone Encounter (Signed)
This was discussed at follow up visit with [place] PA 04/09/19

## 2019-04-11 NOTE — Telephone Encounter (Signed)
Per pt was wanting to not take Metoprolol due to taking yesterday and feeling palpitations all day long and also checked B/P after work and was 148/93.Encouraged pt to give more time Pt feels that since had not been monitoring B/P that having this run high contributed to the palpitations and was wanting to just monitor  B/P Informed pt to try and continue with Metoprolol and call with update Pt agrees and will give another week or so and will call back with update .Will forward to [facility] PA for review./cy

## 2019-04-12 NOTE — Telephone Encounter (Signed)
Follow up    Patient is calling back in reference to the ongoing palpitations she is having with her medication.

## 2019-04-12 NOTE — Telephone Encounter (Signed)
Since starting Metoprolol she feels like her heart is racing nonstop. She has not checked her HR. Asked her to check and currently HR 98. She is feeling worse taking medication. She has quit having her morning coffee and increased her water intake. She even starting eating breakfast with it to see if that would make a difference, usually does intermittent fasting. In addition to feeling like her heart is racing all the time she is having decreased energy which she expected until her body got used to medication. Patient states getting worse not better. She is taking Toprol 25 mg every morning. Advised to hold in am and will forward to Dr Oval Linsey for review

## 2019-04-13 ENCOUNTER — Encounter: Payer: Self-pay | Admitting: *Deleted

## 2019-04-13 MED ORDER — DILTIAZEM HCL ER COATED BEADS 120 MG PO CP24: 120.0000 mg | ORAL_CAPSULE | Freq: Every day | ORAL | 3 refills | Status: DC

## 2019-04-13 NOTE — Telephone Encounter (Signed)
This is mostly to treat her symptoms.  There is nothing dangerous on her monitor.  It will also help her blood pressure.

## 2019-04-13 NOTE — Telephone Encounter (Signed)
Advised patient, verbalized understanding. Patient is wanting to know if Dr Oval Linsey feels this is something she actually needs or is it more to just treat her symptoms. Reviewed monitor results with patient but she would like Dr Blenda Mounts thoughts. She is ok taking new medication and Rx sent to pharmacy. She will continue to hold Metoprolol and take Diltiazem if she feels worse over weekend, she has continued to feel like heart racing but some better. Will forward to Dr Oval Linsey for review

## 2019-04-13 NOTE — Telephone Encounter (Signed)
Stop metoprolol.  Try diltiazem 120mg  daily instead.

## 2019-04-13 NOTE — Addendum Note (Signed)
Addended by: Alvina Filbert B on: 04/13/2019 02:41 PM   Modules accepted: Orders

## 2019-04-13 NOTE — Telephone Encounter (Signed)
Mychart message sent to patient.

## 2019-04-19 NOTE — Telephone Encounter (Signed)
From Dulcy Fanny To Earvin Hansen, LPN Sent 07/07/4174 3:01 PM  Thank you so very much [place]. I'll hold off on all medication (except amlodipine) for now. I'll monitor my blood pressure 3 times a week and also keep track of how I'm feeling. If anything changes, I'll call the office. I appreciate you so very much for the taking the time with me and my concerns. Have a great weekend!

## 2019-07-03 DIAGNOSIS — Z20828 Contact with and (suspected) exposure to other viral communicable diseases: Secondary | ICD-10-CM | POA: Diagnosis not present

## 2019-08-22 ENCOUNTER — Other Ambulatory Visit: Payer: Self-pay | Admitting: Medical

## 2019-11-22 ENCOUNTER — Other Ambulatory Visit: Payer: Self-pay | Admitting: Medical

## 2019-12-07 HISTORY — PX: ABDOMINOPLASTY: SHX5355

## 2019-12-21 ENCOUNTER — Encounter: Payer: Self-pay | Admitting: Medical

## 2019-12-21 ENCOUNTER — Other Ambulatory Visit: Payer: Self-pay

## 2019-12-21 ENCOUNTER — Ambulatory Visit: Payer: BC Managed Care – PPO | Admitting: Medical

## 2019-12-21 VITALS — BP 126/70 | HR 91 | Temp 98.2°F | Ht 67.0 in | Wt 177.6 lb

## 2019-12-21 DIAGNOSIS — Z7185 Encounter for immunization safety counseling: Secondary | ICD-10-CM

## 2019-12-21 DIAGNOSIS — I471 Supraventricular tachycardia, unspecified: Secondary | ICD-10-CM

## 2019-12-21 DIAGNOSIS — E559 Vitamin D deficiency, unspecified: Secondary | ICD-10-CM | POA: Diagnosis not present

## 2019-12-21 DIAGNOSIS — I10 Essential (primary) hypertension: Secondary | ICD-10-CM

## 2019-12-21 DIAGNOSIS — Z0000 Encounter for general adult medical examination without abnormal findings: Secondary | ICD-10-CM | POA: Diagnosis not present

## 2019-12-21 DIAGNOSIS — Z01818 Encounter for other preprocedural examination: Secondary | ICD-10-CM | POA: Diagnosis not present

## 2019-12-21 DIAGNOSIS — Z7189 Other specified counseling: Secondary | ICD-10-CM

## 2019-12-21 DIAGNOSIS — K5900 Constipation, unspecified: Secondary | ICD-10-CM

## 2019-12-21 DIAGNOSIS — J301 Allergic rhinitis due to pollen: Secondary | ICD-10-CM

## 2019-12-21 DIAGNOSIS — Z789 Other specified health status: Secondary | ICD-10-CM

## 2019-12-21 DIAGNOSIS — Z975 Presence of (intrauterine) contraceptive device: Secondary | ICD-10-CM

## 2019-12-21 DIAGNOSIS — R002 Palpitations: Secondary | ICD-10-CM

## 2019-12-21 DIAGNOSIS — R519 Headache, unspecified: Secondary | ICD-10-CM

## 2019-12-21 DIAGNOSIS — Z2821 Immunization not carried out because of patient refusal: Secondary | ICD-10-CM

## 2019-12-21 MED ORDER — TRULANCE 3 MG PO TABS: 3.0000 mg | ORAL_TABLET | Freq: Every day | ORAL | 0 refills | Status: DC

## 2019-12-21 NOTE — Progress Notes (Signed)
Subjective:   HPI  Christine Gardner is a 44 y.o. female who presents for a complete physical.  Preventative care: [facility] Dr. Joni Reining, dentist  [facility], [name] Dr. Skeet Latch, cardiology Rolande Moe, Camelia Eng, PA-C here for primary care   Concerns: Doing well in general.  She is having plastic surgery at [place] [facility] on January 01, 2020 for a tummy tuck.  She still notes some constipation issues.  Has failed several trials of medications in the past.  Sometimes can go 5 days without bowel movement.  Gets occasional migraine.  Declines flu shot.  Compliant with amlodipine without complaint.  Reviewed their medical, surgical, family, social, medication, and allergy history and updated chart as appropriate.  Past Medical History:  Diagnosis Date  . 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 Constipation   . Hypertension 2016  . IUD (intrauterine device) in place 2019   Mirena  . Migraine   . Routine gynecological examination    [name], [place]  . Seasonal allergic rhinitis    allergy testing 06/2013 with [facility]  . Type A blood, Rh negative   . Vitamin D deficiency     Past Surgical History:  Procedure Laterality Date  . COLONOSCOPY  12/2015   polyp.   [place], Dr. Doristine Devoid  . COLPOSCOPY  2013   LGSIL, f/u q50mo as of 6/14  . TOE SURGERY     hammer toe 4th, 5th, bilat    Social History   Socioeconomic History  . Marital status: Married    Spouse name: Not on file  . Number of children: Not on file  . Years of education: Not on file  . Highest education level: Not on file  Occupational History  . Not on file  Tobacco Use  . Smoking status: Never Smoker  . Smokeless tobacco: Never Used  Substance and Sexual Activity  . Alcohol use: Yes    Alcohol/week: 2.0 standard drinks    Types: 2 Glasses of wine per week  . Drug use: No  . Sexual activity: Yes    Birth  control/protection: I.U.D.    Comment: mirena /vas   Other Topics Concern  . Not on file  Social History Narrative   Divorced, remarried 08/2018.   45yo, 57yo, 48yo children, 1 grandchild.  Exercise 3-4 days per week with treadmill, elliptical, weights.  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  08/2017 opened up [employer].   [employer].  As of 12/2019   Social Determinants of Health   Financial Resource Strain:   . Difficulty of Paying Living Expenses: Not on file  Food Insecurity:   . Worried About Charity fundraiser in the Last Year: Not on file  . Ran Out of Food in the Last Year: Not on file  Transportation Needs:   . Lack of Transportation (Medical): Not on file  . Lack of Transportation (Non-Medical): Not on file  Physical Activity:   . Days of Exercise per Week: Not on file  . Minutes of Exercise per Session: Not on file  Stress:   . Feeling of Stress : Not on file  Social Connections:   . Frequency of Communication with Friends and Family: Not on file  . Frequency of Social Gatherings with Friends and Family: Not on file  . Attends Religious Services: Not on file  . Active Member of Clubs or Organizations: Not on file  .  Attends Archivist Meetings: Not on file  . Marital Status: Not on file  Intimate Partner Violence:   . Fear of Current or Ex-Partner: Not on file  . Emotionally Abused: Not on file  . Physically Abused: Not on file  . Sexually Abused: Not on file    Family History  Problem Relation Age of Onset  . Hyperlipidemia Mother   . Migraines Mother   . Hypertension Mother   . Diabetes Mother   . Heart disease Father 55       died of MI  . Other Father        quesionable drug abuse  . Hypertension Sister   . Hypertension Brother   . Hypertension Sister   . Hyperlipidemia Maternal Grandmother   . Diabetes Maternal Grandmother   . Cancer Maternal Grandmother        breast with mets  . Heart disease Sister        CHF due  to hyperthyroid  . Hyperthyroidism Sister   . Cancer Maternal Aunt        breast  . Cancer Maternal Aunt        breast  . Stroke Neg Hx   . Colon cancer Neg Hx      Current Outpatient Medications:  .  amLODipine (NORVASC) 5 MG tablet, TAKE 1 TABLET BY MOUTH EVERY DAY, Disp: 90 tablet, Rfl: 0 .  fexofenadine (ALLEGRA) 180 MG tablet, Take 180 mg by mouth daily., Disp: , Rfl:  .  levonorgestrel (MIRENA) 20 MCG/24HR IUD, 1 each by Intrauterine route once., Disp: , Rfl:  .  Probiotic Product (PROBIOTIC DAILY PO), Take by mouth., Disp: , Rfl:  .  Vitamin D, Ergocalciferol, (DRISDOL) 1.25 MG (50000 UT) CAPS capsule, TAKE 1 CAPSULE (50,000 UNITS TOTAL) BY MOUTH EVERY 7 (SEVEN) DAYS., Disp: 12 capsule, Rfl: 3 .  diltiazem (CARDIZEM CD) 120 MG 24 hr capsule, Take 1 capsule (120 mg total) by mouth daily., Disp: 90 capsule, Rfl: 3  Allergies  Allergen Reactions  . Flagyl [Metronidazole] Anaphylaxis  . Vicodin [Hydrocodone-Acetaminophen]     itching  . Metoprolol     INCREASED HR   . Percocet [Oxycodone-Acetaminophen] Itching  . Tramadol Itching   Review of Systems Constitutional: -fever, -chills, -sweats, -unexpected weight change, -decreased appetite, -fatigue Allergy: -sneezing, -itching, -congestion Dermatology: -changing moles, --rash, -lumps ENT: -runny nose, -ear pain, -sore throat, -hoarseness, -sinus pain, -teeth pain, - ringing in ears, -hearing loss, -nosebleeds Cardiology: -chest pain, -palpitations, -swelling, -difficulty breathing when lying flat, -waking up short of breath Respiratory: -cough, -shortness of breath, -difficulty breathing with exercise or exertion, -wheezing, -coughing up blood Gastroenterology: -abdominal pain, -nausea, -vomiting, -diarrhea, -constipation, -blood in stool, -changes in bowel movement, -difficulty swallowing or eating Hematology: -bleeding, -bruising  Musculoskeletal: -joint aches, -muscle aches, -joint swelling, -back pain, -neck pain,  -cramping, -changes in gait Ophthalmology: denies vision changes, eye redness, itching, discharge Urology: -burning with urination, -difficulty urinating, -blood in urine, -urinary frequency, -urgency, -incontinence Neurology: -headache, -weakness, -tingling, -numbness, -memory loss, -falls, +dizziness Psychology: -depressed mood, -agitation, +sleep problems     Objective:   Physical Exam  BP 126/70   Pulse 91   Temp 98.2 F (36.8 C)   Ht 5\' 7"  (1.702 m)   Wt 177 lb 9.6 oz (80.6 kg)   SpO2 96%   BMI 27.82 kg/m   BP Readings from Last 3 Encounters:  12/21/19 126/70  02/07/19 120/84  12/19/18 110/70   General appearance: alert, no distress, WD/WN,  pleasant African-American female  Skin: tattoo right lower flank/abdomen, tattoo upper back, tattoo lower half of back, no worrisome lesions  HEENT: normocephalic, conjunctiva/corneas normal, sclerae anicteric, PERRLA, EOMi, nares patent, no discharge or erythema, pharynx normal Oral cavity: MMM, tongue normal, teeth in good repair  Neck: supple, no lymphadenopathy, no thyromegaly, no masses, normal ROM  Heart: RRR, normal S1, S2, no murmurs  Lungs: CTA bilaterally, no wheezes, rhonchi, or rales  Abdomen: +bs, soft, non tender, non distended, no masses, no hepatomegaly, no splenomegaly, no bruits  Back: non tender, normal ROM, no scoliosis  Musculoskeletal: upper extremities non tender, no obvious deformity, normal ROM throughout, lower extremities non tender, no obvious deformity, normal ROM throughout  Extremities: no edema, no cyanosis, no clubbing  Pulses: 2+ symmetric, upper and lower extremities, normal cap refill  Neurological: alert, oriented x 3, CN2-12 intact, strength normal upper extremities and lower extremities, sensation normal throughout, DTRs 2+ throughout, no cerebellar signs, gait normal  Psychiatric: normal affect, behavior normal, pleasant  Breast/gyn/rectal - deferred to gynecology   Assessment and  Plan :    Encounter Diagnoses  Name Primary?  . Encounter for health maintenance examination in adult Yes  . Essential hypertension   . PSVT (paroxysmal supraventricular tachycardia) ([place])   . Allergic rhinitis due to pollen, unspecified seasonality   . Vitamin D deficiency   . IUD (intrauterine device) in place   . Influenza vaccination declined   . Palpitation   . Constipation, unspecified constipation type   . Medication intolerance   . Nonintractable headache, unspecified chronicity pattern, unspecified headache type   . Vaccine counseling   . Preop examination     Physical exam - discussed healthy lifestyle, diet, exercise, preventative care, vaccinations, and addressed their concerns.  See your eye doctor yearly for routine vision care. See your dentist yearly for routine dental care including hygiene visits twice yearly. See your gynecologist yearly for routine gynecological care.   Hypertension-controlled on amlodipine.  She will continue home monitoring.  Constipation-begin trial of Trulance samples, discussed hydration, fiber intake  Counseled on flu and Covid vaccine.  She declines flu shot.  She will consider Covid vaccine.  Migraines-relatively infrequent  Vitamin D deficiency-continue supplement  Cancer screens: Mammogram and Pap through gynecology up-to-date  Colonoscopy up-to-date and reviewed  Discussed skin surveillance   Follow-up pending labs  Christine Gardner was seen today for annual exam.  Diagnoses and all orders for this visit:  Encounter for health maintenance examination in adult -     Comprehensive metabolic panel -     CBC with Differential -     Protime-INR -     APTT -     Vitamin D, 25-hydroxy -     Lipid Panel  Essential hypertension  PSVT (paroxysmal supraventricular tachycardia) (H[facility])  Allergic rhinitis due to pollen, unspecified seasonality  Vitamin D deficiency -     Vitamin D, 25-hydroxy  IUD (intrauterine device) in  place  Influenza vaccination declined  Palpitation  Constipation, unspecified constipation type  Medication intolerance  Nonintractable headache, unspecified chronicity pattern, unspecified headache type  Vaccine counseling  Preop examination -     Protime-INR -     APTT

## 2019-12-22 LAB — COMPREHENSIVE METABOLIC PANEL
ALT: 12 IU/L (ref 0–32)
AST: 15 IU/L (ref 0–40)
Albumin/Globulin Ratio: 1.6 (ref 1.2–2.2)
Albumin: 4.5 g/dL (ref 3.8–4.8)
Alkaline Phosphatase: 81 IU/L (ref 39–117)
BUN/Creatinine Ratio: 14 (ref 9–23)
BUN: 10 mg/dL (ref 6–24)
Bilirubin Total: 0.5 mg/dL (ref 0.0–1.2)
CO2: 24 mmol/L (ref 20–29)
Calcium: 9.5 mg/dL (ref 8.7–10.2)
Chloride: 103 mmol/L (ref 96–106)
Creatinine, Ser: 0.71 mg/dL (ref 0.57–1.00)
GFR calc Af Amer: 120 mL/min/{1.73_m2} (ref >59)
GFR calc non Af Amer: 104 mL/min/{1.73_m2} (ref >59)
Globulin, Total: 2.9 g/dL (ref 1.5–4.5)
Glucose: 96 mg/dL (ref 65–99)
Potassium: 4.5 mmol/L (ref 3.5–5.2)
Sodium: 139 mmol/L (ref 134–144)
Total Protein: 7.4 g/dL (ref 6.0–8.5)

## 2019-12-22 LAB — LIPID PANEL
Chol/HDL Ratio: 3.1 ratio (ref 0.0–4.4)
Cholesterol, Total: 173 mg/dL (ref 100–199)
HDL: 55 mg/dL (ref >39)
LDL Chol Calc (NIH): 109 mg/dL — ABNORMAL HIGH (ref 0–99)
Triglycerides: 45 mg/dL (ref 0–149)
VLDL Cholesterol Cal: 9 mg/dL (ref 5–40)

## 2019-12-22 LAB — CBC WITH DIFFERENTIAL/PLATELET
Basophils Absolute: 0 10*3/uL (ref 0.0–0.2)
Basos: 0 %
EOS (ABSOLUTE): 0.2 10*3/uL (ref 0.0–0.4)
Eos: 2 %
Hematocrit: 38.6 % (ref 34.0–46.6)
Hemoglobin: 13 g/dL (ref 11.1–15.9)
Immature Grans (Abs): 0 10*3/uL (ref 0.0–0.1)
Immature Granulocytes: 0 %
Lymphocytes Absolute: 1.5 10*3/uL (ref 0.7–3.1)
Lymphs: 21 %
MCH: 33.4 pg — ABNORMAL HIGH (ref 26.6–33.0)
MCHC: 33.7 g/dL (ref 31.5–35.7)
MCV: 99 fL — ABNORMAL HIGH (ref 79–97)
Monocytes Absolute: 0.4 10*3/uL (ref 0.1–0.9)
Monocytes: 6 %
Neutrophils Absolute: 5 10*3/uL (ref 1.4–7.0)
Neutrophils: 71 %
Platelets: 262 10*3/uL (ref 150–450)
RBC: 3.89 x10E6/uL (ref 3.77–5.28)
RDW: 11.8 % (ref 11.7–15.4)
WBC: 7 10*3/uL (ref 3.4–10.8)

## 2019-12-22 LAB — VITAMIN D 25 HYDROXY (VIT D DEFICIENCY, FRACTURES): Vit D, 25-Hydroxy: 38.9 ng/mL (ref 30.0–100.0)

## 2019-12-22 LAB — PROTIME-INR
INR: 1 (ref 0.9–1.2)
Prothrombin Time: 10.8 s (ref 9.1–12.0)

## 2019-12-22 LAB — APTT: aPTT: 29 s (ref 24–33)

## 2019-12-27 ENCOUNTER — Telehealth: Payer: Self-pay | Admitting: Medical

## 2019-12-27 NOTE — Telephone Encounter (Signed)
See if Christine Gardner can add on Covid antibodies test?   Not sure of blood too old?

## 2019-12-28 ENCOUNTER — Other Ambulatory Visit: Payer: Self-pay | Admitting: Medical

## 2019-12-28 ENCOUNTER — Telehealth: Payer: Self-pay | Admitting: Medical

## 2019-12-28 DIAGNOSIS — Z139 Encounter for screening, unspecified: Secondary | ICD-10-CM

## 2019-12-28 DIAGNOSIS — Z20822 Contact with and (suspected) exposure to covid-19: Secondary | ICD-10-CM

## 2019-12-28 NOTE — Telephone Encounter (Signed)
Message has been sent to patient via mychart  

## 2019-12-28 NOTE — Telephone Encounter (Signed)
Gave to [facility]

## 2019-12-28 NOTE — Progress Notes (Signed)
covid

## 2019-12-28 NOTE — Telephone Encounter (Signed)
The blood is too old to add antibodies, but she can swing by for lab nurse visit for covid antibodies

## 2019-12-31 NOTE — Telephone Encounter (Signed)
Make sure she is aware that she can come back for a lab draw for Covid antibodies

## 2020-01-01 NOTE — Telephone Encounter (Signed)
Message was sent on mychart

## 2020-01-22 DIAGNOSIS — Z01419 Encounter for gynecological examination (general) (routine) without abnormal findings: Secondary | ICD-10-CM | POA: Diagnosis not present

## 2020-01-22 DIAGNOSIS — Z1239 Encounter for other screening for malignant neoplasm of breast: Secondary | ICD-10-CM | POA: Diagnosis not present

## 2020-01-22 DIAGNOSIS — Z1231 Encounter for screening mammogram for malignant neoplasm of breast: Secondary | ICD-10-CM | POA: Diagnosis not present

## 2020-02-09 ENCOUNTER — Ambulatory Visit: Payer: BC Managed Care – PPO | Attending: Internal Medicine

## 2020-02-09 DIAGNOSIS — Z23 Encounter for immunization: Secondary | ICD-10-CM

## 2020-02-09 NOTE — Progress Notes (Signed)
   Covid-19 Vaccination Clinic  Name:  Christine Gardner    MRN: [id number] DOB: June 21, 1976  02/09/2020  [name] was observed post Covid-19 immunization for 30 minutes based on pre-vaccination screening without incident. She was provided with Vaccine Information Sheet and instruction to access the V-Safe system.   [name] was instructed to call 911 with any severe reactions post vaccine: Marland Kitchen Difficulty breathing  . Swelling of face and throat  . A fast heartbeat  . A bad rash all over body  . Dizziness and weakness   Immunizations Administered    Name Date Dose VIS Date Route   Pfizer COVID-19 Vaccine 02/09/2020  3:24 PM 0.3 mL 11/16/2019 Intramuscular   Manufacturer: [place]   Lot: [id number]   [place]: [id number]

## 2020-03-01 ENCOUNTER — Ambulatory Visit: Payer: BC Managed Care – PPO | Attending: Internal Medicine

## 2020-03-01 DIAGNOSIS — Z23 Encounter for immunization: Secondary | ICD-10-CM

## 2020-03-01 NOTE — Progress Notes (Signed)
   Covid-19 Vaccination Clinic  Name:  Christine Gardner    MRN: [id number] DOB: 21-Jan-1976  03/01/2020  Christine Gardner was observed post Covid-19 immunization for 30 minutes based on pre-vaccination screening without incident. She was provided with Vaccine Information Sheet and instruction to access the V-Safe system.   Christine Gardner was instructed to call 911 with any severe reactions post vaccine: Marland Kitchen Difficulty breathing  . Swelling of face and throat  . A fast heartbeat  . A bad rash all over body  . Dizziness and weakness   Immunizations Administered    Name Date Dose VIS Date Route   Pfizer COVID-19 Vaccine 03/01/2020  1:01 PM 0.3 mL 11/16/2019 Intramuscular   Manufacturer: [place]   Lot: [id number]   [place]: [id number]

## 2020-03-11 ENCOUNTER — Ambulatory Visit: Payer: BC Managed Care – PPO

## 2020-03-14 ENCOUNTER — Other Ambulatory Visit: Payer: Self-pay | Admitting: Medical

## 2020-03-25 ENCOUNTER — Other Ambulatory Visit: Payer: Self-pay | Admitting: Medical

## 2020-03-25 ENCOUNTER — Telehealth: Payer: Self-pay

## 2020-03-25 MED ORDER — TRULANCE 3 MG PO TABS: 3.0000 mg | ORAL_TABLET | Freq: Every day | ORAL | 2 refills | Status: DC

## 2020-03-25 NOTE — Telephone Encounter (Signed)
I submitted a PA for the pts. Trulance and it was approved from 03/25/20-03/24/21.

## 2020-04-21 DIAGNOSIS — H1013 Acute atopic conjunctivitis, bilateral: Secondary | ICD-10-CM | POA: Diagnosis not present

## 2020-05-22 ENCOUNTER — Other Ambulatory Visit: Payer: Self-pay

## 2020-05-22 ENCOUNTER — Ambulatory Visit (INDEPENDENT_AMBULATORY_CARE_PROVIDER_SITE_OTHER): Payer: BC Managed Care – PPO | Admitting: Medical

## 2020-05-22 ENCOUNTER — Encounter: Payer: Self-pay | Admitting: Medical

## 2020-05-22 DIAGNOSIS — M545 Low back pain, unspecified: Secondary | ICD-10-CM | POA: Insufficient documentation

## 2020-05-22 DIAGNOSIS — M542 Cervicalgia: Secondary | ICD-10-CM | POA: Insufficient documentation

## 2020-05-22 DIAGNOSIS — R519 Headache, unspecified: Secondary | ICD-10-CM

## 2020-05-22 DIAGNOSIS — M79605 Pain in left leg: Secondary | ICD-10-CM

## 2020-05-22 DIAGNOSIS — R109 Unspecified abdominal pain: Secondary | ICD-10-CM | POA: Insufficient documentation

## 2020-05-22 DIAGNOSIS — M79604 Pain in right leg: Secondary | ICD-10-CM | POA: Insufficient documentation

## 2020-05-22 MED ORDER — IBUPROFEN 600 MG PO TABS
600.0000 mg | ORAL_TABLET | Freq: Two times a day (BID) | ORAL | 0 refills | Status: DC
Start: 2020-05-22 — End: 2021-01-06

## 2020-05-22 MED ORDER — METHOCARBAMOL 500 MG PO TABS: 500.0000 mg | ORAL_TABLET | Freq: Every evening | ORAL | 0 refills | Status: DC | PRN
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
## 2020-06-17 DIAGNOSIS — M47819 Spondylosis without myelopathy or radiculopathy, site unspecified: Secondary | ICD-10-CM | POA: Diagnosis not present

## 2020-06-24 ENCOUNTER — Other Ambulatory Visit: Payer: Self-pay | Admitting: Medical

## 2020-06-25 ENCOUNTER — Telehealth: Payer: Self-pay | Admitting: Medical

## 2020-06-25 ENCOUNTER — Other Ambulatory Visit: Payer: Self-pay

## 2020-06-25 DIAGNOSIS — M545 Low back pain, unspecified: Secondary | ICD-10-CM

## 2020-06-25 DIAGNOSIS — R109 Unspecified abdominal pain: Secondary | ICD-10-CM

## 2020-06-25 DIAGNOSIS — M542 Cervicalgia: Secondary | ICD-10-CM

## 2020-06-25 NOTE — Telephone Encounter (Signed)
Please refer to physical therapy, preferably [facility], Carolyne Littles or April Nonato

## 2020-06-25 NOTE — Telephone Encounter (Signed)
Pt states she went to chiropractor and now even worse than before and now would like to try physical therapy. Would like referral to [facility].

## 2020-06-25 NOTE — Telephone Encounter (Signed)
Referral has been placed. 

## 2020-07-15 ENCOUNTER — Encounter: Payer: Self-pay | Admitting: Physical Therapy

## 2020-07-15 ENCOUNTER — Other Ambulatory Visit: Payer: Self-pay

## 2020-07-15 ENCOUNTER — Ambulatory Visit (INDEPENDENT_AMBULATORY_CARE_PROVIDER_SITE_OTHER): Payer: BC Managed Care – PPO | Admitting: Physical Therapy

## 2020-07-15 DIAGNOSIS — M25551 Pain in right hip: Secondary | ICD-10-CM

## 2020-07-15 DIAGNOSIS — M5441 Lumbago with sciatica, right side: Secondary | ICD-10-CM | POA: Diagnosis not present

## 2020-07-15 DIAGNOSIS — M6281 Muscle weakness (generalized): Secondary | ICD-10-CM

## 2020-07-15 DIAGNOSIS — R29898 Other symptoms and signs involving the musculoskeletal system: Secondary | ICD-10-CM

## 2020-07-15 NOTE — Patient Instructions (Signed)
Access Code: [email or web address] URL: [email or web address] Date: 07/15/2020 Prepared by: Faustino Congress  Exercises Supine Bridge with Resistance Band - 2 x daily - 7 x weekly - 1 sets - 10 reps - 5 seconds hold Hooklying Isometric Hip Abduction with Belt - 2 x daily - 7 x weekly - 1 sets - 10 reps - 5 sec hold Prone Hip Extension - 2 x daily - 7 x weekly - 1 sets - 10 reps - 2-3 sec hold  Patient Education Trigger Point Dry Needling
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
## 2020-07-28 ENCOUNTER — Other Ambulatory Visit: Payer: Self-pay

## 2020-07-28 ENCOUNTER — Encounter: Payer: Self-pay | Admitting: Physical Therapy

## 2020-07-28 ENCOUNTER — Ambulatory Visit: Payer: BC Managed Care – PPO | Admitting: Physical Therapy

## 2020-07-28 DIAGNOSIS — M6281 Muscle weakness (generalized): Secondary | ICD-10-CM

## 2020-07-28 DIAGNOSIS — M5441 Lumbago with sciatica, right side: Secondary | ICD-10-CM | POA: Diagnosis not present

## 2020-07-28 DIAGNOSIS — M25551 Pain in right hip: Secondary | ICD-10-CM

## 2020-07-28 DIAGNOSIS — R29898 Other symptoms and signs involving the musculoskeletal system: Secondary | ICD-10-CM
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
## 2020-07-30 ENCOUNTER — Encounter: Payer: Self-pay | Admitting: Medical

## 2020-07-30 ENCOUNTER — Other Ambulatory Visit: Payer: Self-pay

## 2020-07-30 ENCOUNTER — Ambulatory Visit: Payer: BC Managed Care – PPO | Admitting: Medical

## 2020-07-30 VITALS — BP 132/90 | HR 91 | Ht 67.0 in | Wt 174.4 lb

## 2020-07-30 DIAGNOSIS — F430 Acute stress reaction: Secondary | ICD-10-CM | POA: Diagnosis not present

## 2020-07-30 DIAGNOSIS — I10 Essential (primary) hypertension: Secondary | ICD-10-CM

## 2020-07-30 NOTE — Progress Notes (Signed)
Subjective:  Christine Gardner is a 44 y.o. female who presents for Chief Complaint  Patient presents with  . Hypertension     Here for blood pressure concerns.  She has been monitoring her blood pressure occasionally since the first of the year.  She is using an app on the phone to monitor her blood pressures.  They have been actually normal until this past couple weeks where she has seen some elevated readings in the 130/90 range.  She has been under little stress.  She had plastic surgery months ago and may need to have a repair whether could be a hernia that is new.  No pain just concerns are that she may have to do surgery again.  She limits caffeine and alcohol.  She does not add salt to food.  She continues to exercise and eat healthy.  No chest pain no palpitation no shortness of breath no edema no other concerns.  No other aggravating or relieving factors.    No other c/o.  Past Medical History:  Diagnosis Date  . 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 Constipation   . Hypertension 2016  . IUD (intrauterine device) in place 2019   Mirena  . Migraine   . Routine gynecological examination    [name], [place]  . Seasonal allergic rhinitis    allergy testing 06/2013 with [facility]  . Type A blood, Rh negative   . Vitamin D deficiency    Current Outpatient Medications on File Prior to Visit  Medication Sig Dispense Refill  . amLODipine (NORVASC) 5 MG tablet TAKE 1 TABLET BY MOUTH EVERY DAY 90 tablet 0  . levonorgestrel (MIRENA) 20 MCG/24HR IUD 1 each by Intrauterine route once.    . fexofenadine (ALLEGRA) 180 MG tablet Take 180 mg by mouth daily.  (Patient not taking: Reported on 07/30/2020)    . ibuprofen (ADVIL) 600 MG tablet Take 1 tablet (600 mg total) by mouth in the morning and at bedtime. (Patient not taking: Reported on 07/30/2020) 20 tablet 0  . methocarbamol (ROBAXIN) 500 MG tablet Take 1 tablet (500 mg total) by mouth at bedtime as needed  for muscle spasms. (Patient not taking: Reported on 07/15/2020) 10 tablet 0  . Plecanatide (TRULANCE) 3 MG TABS Take 3 mg by mouth daily. (Patient not taking: Reported on 05/22/2020) 30 tablet 2  . Probiotic Product (PROBIOTIC DAILY PO) Take by mouth. (Patient not taking: Reported on 05/22/2020)    . Vitamin D, Ergocalciferol, (DRISDOL) 1.25 MG (50000 UT) CAPS capsule TAKE 1 CAPSULE (50,000 UNITS TOTAL) BY MOUTH EVERY 7 (SEVEN) DAYS. (Patient not taking: Reported on 07/15/2020) 12 capsule 3   No current facility-administered medications on file prior to visit.    The following portions of the patient's history were reviewed and updated as appropriate: allergies, current medications, past family history, past medical history, past social history, past surgical history and problem list.  ROS Otherwise as in subjective above  Objective: BP 132/90   Pulse 91   Ht 5\' 7"  (1.702 m)   Wt 174 lb 6.4 oz (79.1 kg)   SpO2 97%   BMI 27.31 kg/m   General appearance: alert, no distress, well developed, well nourished Neck: supple, no lymphadenopathy, no thyromegaly, no masses Heart: RRR, normal S1, S2, no murmurs Lungs: CTA bilaterally, no wheezes, rhonchi, or rales Abdomen: +bs, soft, non tender, non distended, no masses, no hepatomegaly, no splenomegaly, no bruits Pulses: 2+ radial pulses, 2+ pedal pulses, normal  cap refill Ext: no edema   Assessment: Encounter Diagnoses  Name Primary?  . Essential hypertension Yes  . Acute stress reaction      Plan: I reviewed her recent blood pressure readings on her app on the phone.  Most of her readings of the past several months were normal.  In the recent couple weeks there is some elevated readings but still in the diastolic 90 range likely got today.  I believe it is too early to decide if her blood pressures are truly running high all the time.  I believe she can get herself worked up and anxious.  She has potentially upcoming surgery to redo  herniated area from where she had plastic surgery.  She has been having some aches and pains which can also influence the blood pressure  I advised her to bring her blood pressure cuff and do a side-by-side comparison here at her convenience with the nurse.  Advised not to process about blood pressure and check multiple times per day.  Get a collection of variety of readings on different days to monitor blood pressures.  Limit salt limit caffeine.  Get good sleep.  Continue regular exercise and healthy diet.  When she comes in for nurse visit of her numbers are continuing to stay elevated we can increase amlodipine or try some other modification of her medications.  Destina was seen today for hypertension.  Diagnoses and all orders for this visit:  Essential hypertension  Acute stress reaction    Follow up: nurse visit

## 2020-08-01 ENCOUNTER — Telehealth: Payer: Self-pay

## 2020-08-01 ENCOUNTER — Other Ambulatory Visit: Payer: Self-pay

## 2020-08-01 ENCOUNTER — Other Ambulatory Visit: Payer: BC Managed Care – PPO

## 2020-08-01 DIAGNOSIS — Z20822 Contact with and (suspected) exposure to covid-19: Secondary | ICD-10-CM

## 2020-08-01 DIAGNOSIS — Z139 Encounter for screening, unspecified: Secondary | ICD-10-CM

## 2020-08-01 NOTE — Telephone Encounter (Signed)
Have her use 2 amlodipine tablets daily or 10 mg for the next 2 weeks and monitor blood pressure.  Lets see if she tolerates this without side effect.  Call back 2 weeks

## 2020-08-01 NOTE — Telephone Encounter (Signed)
Pt came in for bp check. I checked her left arm and the reading was 132/86 and pulse was 91. After I checked we used her machine and the reading was 135/89 pulse was 89. Christine Gardner

## 2020-08-04 ENCOUNTER — Encounter: Payer: BC Managed Care – PPO | Admitting: Physical Therapy

## 2020-08-04 NOTE — Telephone Encounter (Signed)
Patient want to know if she can take 1.5 instead of 2. Please advise if this is ok.

## 2020-08-04 NOTE — Telephone Encounter (Signed)
I have thought about that, but it is annoying to cut the pills in half all the time.  She can do this if she desires or she can go ahead and double up and take 2 at a time until we see how her pressures are going to look over the next 2 to 3 weeks.  Obviously dizziness or lightheadedness would preclude [place] to doing 2 tablets a day.  So if she gets adverse symptoms then she will need to let me know.

## 2020-08-04 NOTE — Telephone Encounter (Signed)
Message has been sent to patient via mychart per request

## 2020-08-12 ENCOUNTER — Ambulatory Visit: Payer: BC Managed Care – PPO | Admitting: Physical Therapy

## 2020-08-12 ENCOUNTER — Encounter: Payer: Self-pay | Admitting: Physical Therapy

## 2020-08-12 ENCOUNTER — Other Ambulatory Visit: Payer: Self-pay

## 2020-08-12 DIAGNOSIS — M5441 Lumbago with sciatica, right side: Secondary | ICD-10-CM | POA: Diagnosis not present

## 2020-08-12 DIAGNOSIS — M6281 Muscle weakness (generalized): Secondary | ICD-10-CM | POA: Diagnosis not present

## 2020-08-12 DIAGNOSIS — M25551 Pain in right hip: Secondary | ICD-10-CM | POA: Diagnosis not present

## 2020-08-12 DIAGNOSIS — R29898 Other symptoms and signs involving the musculoskeletal system: Secondary | ICD-10-CM
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
## 2020-09-03 ENCOUNTER — Encounter: Payer: Self-pay | Admitting: Physical Therapy

## 2020-09-04 ENCOUNTER — Other Ambulatory Visit: Payer: Self-pay | Admitting: Medical

## 2020-09-04 MED ORDER — AMLODIPINE BESYLATE 5 MG PO TABS: 7.5000 mg | ORAL_TABLET | Freq: Every day | ORAL | 1 refills | Status: DC

## 2020-12-22 ENCOUNTER — Encounter: Payer: BC Managed Care – PPO | Admitting: Medical

## 2021-01-06 ENCOUNTER — Other Ambulatory Visit: Payer: Self-pay

## 2021-01-06 ENCOUNTER — Encounter: Payer: Self-pay | Admitting: Medical

## 2021-01-06 ENCOUNTER — Ambulatory Visit (INDEPENDENT_AMBULATORY_CARE_PROVIDER_SITE_OTHER): Payer: 59 | Admitting: Medical

## 2021-01-06 VITALS — BP 128/84 | HR 75 | Ht 67.0 in | Wt 176.6 lb

## 2021-01-06 DIAGNOSIS — Z7185 Encounter for immunization safety counseling: Secondary | ICD-10-CM | POA: Insufficient documentation

## 2021-01-06 DIAGNOSIS — I471 Supraventricular tachycardia, unspecified: Secondary | ICD-10-CM

## 2021-01-06 DIAGNOSIS — I10 Essential (primary) hypertension: Secondary | ICD-10-CM

## 2021-01-06 DIAGNOSIS — Z1211 Encounter for screening for malignant neoplasm of colon: Secondary | ICD-10-CM

## 2021-01-06 DIAGNOSIS — Z1159 Encounter for screening for other viral diseases: Secondary | ICD-10-CM

## 2021-01-06 DIAGNOSIS — Z975 Presence of (intrauterine) contraceptive device: Secondary | ICD-10-CM

## 2021-01-06 DIAGNOSIS — Z0000 Encounter for general adult medical examination without abnormal findings: Secondary | ICD-10-CM

## 2021-01-06 DIAGNOSIS — E559 Vitamin D deficiency, unspecified: Secondary | ICD-10-CM | POA: Diagnosis not present

## 2021-01-06 DIAGNOSIS — J301 Allergic rhinitis due to pollen: Secondary | ICD-10-CM

## 2021-01-06 DIAGNOSIS — K5900 Constipation, unspecified: Secondary | ICD-10-CM | POA: Diagnosis not present

## 2021-01-06 NOTE — Progress Notes (Signed)
Done

## 2021-01-06 NOTE — Progress Notes (Signed)
Subjective:   HPI  Christine Gardner is a 45 y.o. female who presents for a complete physical.  Preventative care: [facility] Dr. Joni Reining, dentist  [facility], [name] Dr. Skeet Latch, cardiology Dr. Waymon Budge, Camelia Eng, PA-C here for primary care   Concerns: Back on probiotics, takes probiotics daily, B complex vitamins daily and Vit D supplement.  Feels pretty good  Has appt soon with cardiology for routine f/u  Compliant with amlodipine without complaint.  Still has chronic issues with constipation, belly discomfort.   Sometimes can go 5 days without BM  Reviewed their medical, surgical, family, social, medication, and allergy history and updated chart as appropriate.  Past Medical History:  Diagnosis Date  . 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 Constipation   . Hypertension 2016  . IUD (intrauterine device) in place 2019   Mirena  . Migraine   . Routine gynecological examination    [name], [place]  . Seasonal allergic rhinitis    allergy testing 06/2013 with [place]  . Type A blood, Rh negative   . Vitamin D deficiency     Past Surgical History:  Procedure Laterality Date  . COLONOSCOPY  12/2015   polyp.   [place], Dr. Doristine Devoid  . COLPOSCOPY  2013   LGSIL, f/u q26mo as of 6/14  . TOE SURGERY     hammer toe 4th, 5th, bilat    Social History   Socioeconomic History  . Marital status: Married    Spouse name: Not on file  . Number of children: Not on file  . Years of education: Not on file  . Highest education level: Not on file  Occupational History  . Not on file  Tobacco Use  . Smoking status: Never Smoker  . Smokeless tobacco: Never Used  Vaping Use  . Vaping Use: Never used  Substance and Sexual Activity  . Alcohol use: Yes    Alcohol/week: 2.0 standard drinks    Types: 2 Glasses of wine per week  . Drug use: No  . Sexual activity: Yes    Birth  control/protection: I.U.D.    Comment: mirena /vas   Other Topics Concern  . Not on file  Social History Narrative   Divorced, remarried 08/2018.  3 adult children, 2 grandchild.  Exercise 3-4 days per week with treadmill, elliptical, weights.  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  08/2017 opened up [employer].   Christian.  As of 12/2020   Social Determinants of Health   Financial Resource Strain: Not on file  Food Insecurity: Not on file  Transportation Needs: Not on file  Physical Activity: Not on file  Stress: Not on file  Social Connections: Not on file  Intimate Partner Violence: Not on file    Family History  Problem Relation Age of Onset  . Hyperlipidemia Mother   . Migraines Mother   . Hypertension Mother   . Diabetes Mother   . Heart disease Father 1       died of MI  . Other Father        quesionable drug abuse  . Hypertension Sister   . Hypertension Brother   . Hypertension Sister   . Hyperlipidemia Maternal Grandmother   . Diabetes Maternal Grandmother   . Cancer Maternal Grandmother        breast with mets  . Heart disease Sister        CHF  due to hyperthyroid  . Hyperthyroidism Sister   . Cancer Maternal Aunt        breast  . Cancer Maternal Aunt        breast  . Stroke Neg Hx   . Colon cancer Neg Hx      Current Outpatient Medications:  .  amLODipine (NORVASC) 5 MG tablet, Take 1.5 tablets (7.5 mg total) by mouth daily., Disp: 135 tablet, Rfl: 1 .  levonorgestrel (MIRENA) 20 MCG/24HR IUD, 1 each by Intrauterine route once., Disp: , Rfl:  .  Probiotic Product (PROBIOTIC DAILY PO), Take by mouth., Disp: , Rfl:  .  Vitamin D, Ergocalciferol, (DRISDOL) 1.25 MG (50000 UT) CAPS capsule, TAKE 1 CAPSULE (50,000 UNITS TOTAL) BY MOUTH EVERY 7 (SEVEN) DAYS., Disp: 12 capsule, Rfl: 3  Allergies  Allergen Reactions  . Flagyl [Metronidazole] Anaphylaxis  . Vicodin [Hydrocodone-Acetaminophen]     itching  . Hydrocodone-Acetaminophen  Itching  . Metoprolol     INCREASED HR   . Oxycodone-Acetaminophen Itching  . Percocet [Oxycodone-Acetaminophen] Itching  . Tramadol Itching   Review of Systems Constitutional: -fever, -chills, -sweats, -unexpected weight change, -decreased appetite, -fatigue Allergy: -sneezing, -itching, -congestion Dermatology: -changing moles, --rash, -lumps ENT: -runny nose, -ear pain, -sore throat, -hoarseness, -sinus pain, -teeth pain, - ringing in ears, -hearing loss, -nosebleeds Cardiology: -chest pain, +occasional palpitations, chronic for years, -swelling, -difficulty breathing when lying flat, -waking up short of breath Respiratory: -cough, -shortness of breath, -difficulty breathing with exercise or exertion, -wheezing, -coughing up blood Gastroenterology: -abdominal pain, -nausea, -vomiting, -diarrhea, +chronic constipation, -blood in stool, -changes in bowel movement, -difficulty swallowing or eating Hematology: -bleeding, -bruising  Musculoskeletal: -joint aches, -muscle aches, -joint swelling, -back pain, -neck pain, -cramping, -changes in gait Ophthalmology: denies vision changes, eye redness, itching, discharge Urology: -burning with urination, -difficulty urinating, -blood in urine, -urinary frequency, -urgency, -incontinence Neurology: -headache, -weakness, -tingling, -numbness, -memory loss, -falls, +dizziness Psychology: -depressed mood, -agitation, -sleep problems     Objective:   Physical Exam  BP 128/84   Pulse 75   Ht 5\' 7"  (1.702 m)   Wt 176 lb 9.6 oz (80.1 kg)   SpO2 100%   BMI 27.66 kg/m   BP Readings from Last 3 Encounters:  01/06/21 128/84  08/01/20 135/89  07/30/20 132/90   Wt Readings from Last 3 Encounters:  01/06/21 176 lb 9.6 oz (80.1 kg)  07/30/20 174 lb 6.4 oz (79.1 kg)  05/22/20 176 lb 6.4 oz (80 kg)     General appearance: alert, no distress, WD/WN, pleasant African-American female  Skin: tattoo right lower flank/abdomen, tattoo upper back,  tattoo lower half of back, no worrisome lesions  HENT/oral - limited exam given covid precautions Neck: supple, no lymphadenopathy, no thyromegaly, no masses, normal ROM, no bruit Heart: RRR, normal S1, S2, no murmurs  Lungs: CTA bilaterally, no wheezes, rhonchi, or rales  Abdomen: +bs, soft, non tender, non distended, no masses, no hepatomegaly, no splenomegaly, no bruits  Back: non tender, normal ROM, no scoliosis  Musculoskeletal: upper extremities non tender, no obvious deformity, normal ROM throughout, lower extremities non tender, no obvious deformity, normal ROM throughout  Extremities: no edema, no cyanosis, no clubbing  Pulses: 2+ symmetric, upper and lower extremities, normal cap refill  Neurological: alert, oriented x 3, CN2-12 intact, strength normal upper extremities and lower extremities, sensation normal throughout, DTRs 2+ throughout, no cerebellar signs, gait normal  Psychiatric: normal affect, behavior normal, pleasant  Breast/gyn/rectal - deferred to gynecology   Assessment and Plan :  Encounter Diagnoses  Name Primary?  . Encounter for health maintenance examination in adult Yes  . Constipation, unspecified constipation type   . Vitamin D deficiency   . Essential hypertension   . PSVT (paroxysmal supraventricular tachycardia) ([place])   . Allergic rhinitis due to pollen, unspecified seasonality   . IUD (intrauterine device) in place   . Vaccine counseling   . Encounter for hepatitis C screening test for low risk patient   . Screen for colon cancer     Today you had a preventative care visit or wellness visit.    Topics today may have included healthy lifestyle, diet, exercise, preventative care, vaccinations, sick and well care, proper use of emergency dept and after hours care, as well as other concerns.     Recommendations: Continue to return yearly for your annual wellness and preventative care visits.  This gives [place] a chance to discuss healthy  lifestyle, exercise, vaccinations, review your chart record, and perform screenings where appropriate.  I recommend you see your eye doctor yearly for routine vision care.  I recommend you see your dentist yearly for routine dental care including hygiene visits twice yearly.   Vaccination recommendations were reviewed  You are up to date on Tetanus vaccine You are up to date on Covid vaccine  Recommendations: Yearly flu vaccine   Vaccinations updated as below:  Patient declines flu vaccines   Screening for cancer: Breast cancer screening: You should perform a self breast exam monthly.   We reviewed recommendations for regular mammograms and breast cancer screening. Sees gyn soon for updated mammogram  Colon cancer screening:  I reviewed your colonoscopy on file that is up to date from 2017 Refer back to GI for chronic constipation, colon cancer screening  Cervical cancer screening: We reviewed recommendations for pap smear screening. We will request last pap from [facility]  Skin cancer screening: Check your skin regularly for new changes, growing lesions, or other lesions of concern Come in for evaluation if you have skin lesions of concern.  Lung cancer screening: If you have a greater than 30 pack year history of tobacco use, then you qualify for lung cancer screening with a chest CT scan  We currently don't have screenings for other cancers besides breast, cervical, colon, and lung cancers.  If you have a strong family history of cancer or have other cancer screening concerns, please let me know.    Bone health: Get at least 150 minutes of aerobic exercise weekly Get weight bearing exercise at least once weekly   Heart health: Get at least 150 minutes of aerobic exercise weekly Limit alcohol It is important to maintain a healthy blood pressure and healthy cholesterol numbers   Separate significant issues discussed: Chronic constipation - long  standing problem.  Referral back to GI for further eval and management and for screening for colon cancer  Hypertension-controlled on current medication  History of palpitations on and off despite limited caffeine use.  She has routine follow-up with cardiology soon for yearly checkup  Vitamin D deficiency-continue supplement  Christine Gardner was seen today for annual exam.  Diagnoses and all orders for this visit:  Encounter for health maintenance examination in adult -     Comprehensive metabolic panel -     CBC with Differential/Platelet -     TSH -     Lipid panel -     HIV Antibody (routine testing w rflx) -     Hepatitis C antibody -  VITAMIN D 25 Hydroxy (Vit-D Deficiency, Fractures) -     Ambulatory referral to Gastroenterology  Constipation, unspecified constipation type -     TSH -     Ambulatory referral to Gastroenterology  Vitamin D deficiency -     VITAMIN D 25 Hydroxy (Vit-D Deficiency, Fractures)  Essential hypertension -     Lipid panel  PSVT (paroxysmal supraventricular tachycardia) (HCC)  Allergic rhinitis due to pollen, unspecified seasonality  IUD (intrauterine device) in place  Vaccine counseling  Encounter for hepatitis C screening test for low risk patient -     Hepatitis C antibody  Screen for colon cancer -     Ambulatory referral to Gastroenterology   Follow-up pending labs, yearly for physical

## 2021-01-07 ENCOUNTER — Other Ambulatory Visit: Payer: Self-pay | Admitting: Medical

## 2021-01-07 LAB — CBC WITH DIFFERENTIAL/PLATELET
Basophils Absolute: 0 10*3/uL (ref 0.0–0.2)
Basos: 0 %
EOS (ABSOLUTE): 0.1 10*3/uL (ref 0.0–0.4)
Eos: 2 %
Hematocrit: 41.4 % (ref 34.0–46.6)
Hemoglobin: 14 g/dL (ref 11.1–15.9)
Immature Grans (Abs): 0 10*3/uL (ref 0.0–0.1)
Immature Granulocytes: 0 %
Lymphocytes Absolute: 1.2 10*3/uL (ref 0.7–3.1)
Lymphs: 25 %
MCH: 32.5 pg (ref 26.6–33.0)
MCHC: 33.8 g/dL (ref 31.5–35.7)
MCV: 96 fL (ref 79–97)
Monocytes Absolute: 0.3 10*3/uL (ref 0.1–0.9)
Monocytes: 6 %
Neutrophils Absolute: 3.2 10*3/uL (ref 1.4–7.0)
Neutrophils: 67 %
Platelets: 277 10*3/uL (ref 150–450)
RBC: 4.31 x10E6/uL (ref 3.77–5.28)
RDW: 11.4 % — ABNORMAL LOW (ref 11.7–15.4)
WBC: 4.8 10*3/uL (ref 3.4–10.8)

## 2021-01-07 LAB — LIPID PANEL
Chol/HDL Ratio: 3 ratio (ref 0.0–4.4)
Cholesterol, Total: 204 mg/dL — ABNORMAL HIGH (ref 100–199)
HDL: 67 mg/dL (ref >39)
LDL Chol Calc (NIH): 126 mg/dL — ABNORMAL HIGH (ref 0–99)
Triglycerides: 63 mg/dL (ref 0–149)
VLDL Cholesterol Cal: 11 mg/dL (ref 5–40)

## 2021-01-07 LAB — COMPREHENSIVE METABOLIC PANEL
ALT: 15 IU/L (ref 0–32)
AST: 19 IU/L (ref 0–40)
Albumin/Globulin Ratio: 1.5 (ref 1.2–2.2)
Albumin: 4.8 g/dL (ref 3.8–4.8)
Alkaline Phosphatase: 97 IU/L (ref 44–121)
BUN/Creatinine Ratio: 14 (ref 9–23)
BUN: 10 mg/dL (ref 6–24)
Bilirubin Total: 0.8 mg/dL (ref 0.0–1.2)
CO2: 24 mmol/L (ref 20–29)
Calcium: 9.9 mg/dL (ref 8.7–10.2)
Chloride: 101 mmol/L (ref 96–106)
Creatinine, Ser: 0.69 mg/dL (ref 0.57–1.00)
GFR calc Af Amer: 122 mL/min/{1.73_m2} (ref >59)
GFR calc non Af Amer: 105 mL/min/{1.73_m2} (ref >59)
Globulin, Total: 3.3 g/dL (ref 1.5–4.5)
Glucose: 96 mg/dL (ref 65–99)
Potassium: 4 mmol/L (ref 3.5–5.2)
Sodium: 140 mmol/L (ref 134–144)
Total Protein: 8.1 g/dL (ref 6.0–8.5)

## 2021-01-07 LAB — VITAMIN D 25 HYDROXY (VIT D DEFICIENCY, FRACTURES): Vit D, 25-Hydroxy: 25.5 ng/mL — ABNORMAL LOW (ref 30.0–100.0)

## 2021-01-07 LAB — HEPATITIS C ANTIBODY: Hep C Virus Ab: <0.1 s/co ratio (ref 0.0–0.9)

## 2021-01-07 LAB — TSH: TSH: 1.77 u[IU]/mL (ref 0.450–4.500)

## 2021-01-07 LAB — HIV ANTIBODY (ROUTINE TESTING W REFLEX): HIV Screen 4th Generation wRfx: NONREACTIVE

## 2021-01-07 LAB — HM MAMMOGRAPHY

## 2021-01-07 MED ORDER — AMLODIPINE BESYLATE 5 MG PO TABS: 7.5000 mg | ORAL_TABLET | Freq: Every day | ORAL | 3 refills | Status: DC

## 2021-01-07 MED ORDER — VITAMIN D 25 MCG (1000 UNIT) PO TABS: 2000.0000 [IU] | ORAL_TABLET | Freq: Every day | ORAL | 3 refills | Status: DC

## 2021-02-12 ENCOUNTER — Encounter: Payer: Self-pay | Admitting: Medical

## 2021-03-11 ENCOUNTER — Ambulatory Visit (INDEPENDENT_AMBULATORY_CARE_PROVIDER_SITE_OTHER): Payer: 59 | Admitting: Cardiovascular Disease

## 2021-03-11 ENCOUNTER — Encounter: Payer: Self-pay | Admitting: Cardiovascular Disease

## 2021-03-11 ENCOUNTER — Other Ambulatory Visit: Payer: Self-pay

## 2021-03-11 VITALS — BP 120/72 | HR 80 | Ht 67.0 in | Wt 183.2 lb

## 2021-03-11 DIAGNOSIS — I10 Essential (primary) hypertension: Secondary | ICD-10-CM

## 2021-03-11 DIAGNOSIS — R002 Palpitations: Secondary | ICD-10-CM | POA: Diagnosis not present

## 2021-03-11 DIAGNOSIS — I471 Supraventricular tachycardia: Secondary | ICD-10-CM

## 2021-03-11 MED ORDER — DILTIAZEM HCL 30 MG PO TABS: 30.0000 mg | ORAL_TABLET | Freq: Four times a day (QID) | ORAL | 3 refills | Status: DC | PRN

## 2021-03-11 NOTE — Patient Instructions (Signed)
Medication Instructions:  START- Diltiazem 30 mg by mouth every 6 hours as needed for palpitations  *If you need a refill on your cardiac medications before your next appointment, please call your pharmacy*   Lab Work: None Ordered   Testing/Procedures: None Ordered   Follow-Up: At [facility], you and your health needs are our priority.  As part of our continuing mission to provide you with exceptional heart care, we have created designated Provider Care Teams.  These Care Teams include your primary Cardiologist (physician) and Advanced Practice Providers (APPs -  Physician Assistants and Nurse Practitioners) who all work together to provide you with the care you need, when you need it.  We recommend signing up for the patient portal called "MyChart".  Sign up information is provided on this After Visit Summary.  MyChart is used to connect with patients for Virtual Visits (Telemedicine).  Patients are able to view lab/test results, encounter notes, upcoming appointments, etc.  Non-urgent messages can be sent to your provider as well.   To learn more about what you can do with MyChart, go to [email or web address].    Your next appointment:   1 year(s)  The format for your next appointment:   In Person  Provider:   You may see Skeet Latch, MD or one of the following Advanced Practice Providers on your designated Care Team:    [place], PA-C  Coletta Memos, FNP

## 2021-03-11 NOTE — Progress Notes (Signed)
Cardiology Office Note   Date:  03/11/2021   ID:  Christine Gardner, DOB Jun 25, 1976, MRN [id number]  PCP:  Christine Hurl, PA-C  Cardiologist:   Christine Latch, MD   No chief complaint on file.    History of Present Illness: Christine Gardner is a 45 y.o. female with hypertension, SVT and PACs who is being seen today for follow-up.  She was initially seen in 2020 for tthe evaluation of palpitations.  Christine Gardner has experienced palpitations for years.  This occurred in 2016 and she wore an ambulatory monitor.  During that time she pressed the button multiple times but no arrhythmias were noted.  Since then it seems to be getting a lot worse.  In the week before Christmas 2019 she had palpitations that were incessant for approximately a week.  She wore a monitor again 02/2019 and had episodes of SVT with rates up to 201 bpm.  She was started on metoprolol.  She did not tolerate it was switched to diltiazem but never took it.  She just started back exercising after a year off from surgery.  She is working out with a Clinical research associate and walks regularly.  She has no exertional chest pain or shortness of breath.  She denies lower extremity edema, orthopnea, or PND.  She continues to have occasional palpitations but not as often.  She is not interested in taking a medication every day.  She noted that her blood pressure was elevated at home and she was feeling poorly so she increased amlodipine to 7.5 mg daily.  This seems to have been doing good job controlling her BP.  She does not check it regularly but notes that every time she has been to a doctor's office it is less than 130/80.   Past Medical History:  Diagnosis Date  . 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 Constipation   . Hypertension 2016  . IUD (intrauterine device) in place 2019   Mirena  . Migraine   . Routine gynecological examination    [name], [place]  . Seasonal allergic rhinitis    allergy testing  06/2013 with [place]  . Type A blood, Rh negative   . Vitamin D deficiency     Past Surgical History:  Procedure Laterality Date  . COLONOSCOPY  12/2015   polyp.   [place], Dr. Doristine Devoid  . COLPOSCOPY  2013   LGSIL, f/u q54mo as of 6/14  . TOE SURGERY     hammer toe 4th, 5th, bilat     Current Outpatient Medications  Medication Sig Dispense Refill  . amLODipine (NORVASC) 5 MG tablet Take 1.5 tablets (7.5 mg total) by mouth daily. 135 tablet 3  . cholecalciferol (VITAMIN D3) 25 MCG (1000 UNIT) tablet Take 2 tablets (2,000 Units total) by mouth daily. 180 tablet 3  . diltiazem (CARDIZEM) 30 MG tablet Take 1 tablet (30 mg total) by mouth every 6 (six) hours as needed. 90 tablet 3  . levonorgestrel (MIRENA) 20 MCG/24HR IUD 1 each by Intrauterine route once.    . Probiotic Product (PROBIOTIC DAILY PO) Take by mouth.     No current facility-administered medications for this visit.    Allergies:   Flagyl [metronidazole], Vicodin [hydrocodone-acetaminophen], Hydrocodone-acetaminophen, Metoprolol, Oxycodone-acetaminophen, Percocet [oxycodone-acetaminophen], and Tramadol    Social History:  The patient  reports that she has never smoked. She has never used smokeless tobacco. She reports current alcohol use of about 2.0 standard drinks of alcohol per week.  She reports that she does not use drugs.   Family History:  The patient's family history includes Cancer in her maternal aunt, maternal aunt, and maternal grandmother; Diabetes in her maternal grandmother and mother; Heart disease in her sister; Heart disease (age of onset: 37) in her father; Hyperlipidemia in her maternal grandmother and mother; Hypertension in her brother, mother, sister, and sister; Hyperthyroidism in her sister; Migraines in her mother; Other in her father.    ROS:  Please see the history of present illness.   Otherwise, review of systems are positive for none.   All other systems are reviewed and negative.     PHYSICAL EXAM: VS:  BP 120/72   Pulse 80   Ht 5\' 7"  (1.702 m)   Wt 183 lb 3.2 oz (83.1 kg)   BMI 28.69 kg/m  , BMI Body mass index is 28.69 kg/m. GENERAL:  Well appearing HEENT:  Pupils equal round and reactive, fundi not visualized, oral mucosa unremarkable NECK:  No jugular venous distention, waveform within normal limits, carotid upstroke brisk and symmetric, no bruits LUNGS:  Clear to auscultation bilaterally HEART:  RRR.  PMI not displaced or sustained,S1 and S2 within normal limits, no S3, no S4, no clicks, no rubs, no murmurs ABD:  Flat, positive bowel sounds normal in frequency in pitch, no bruits, no rebound, no guarding, no midline pulsatile mass, no hepatomegaly, no splenomegaly EXT:  2 plus pulses throughout, no edema, no cyanosis no clubbing SKIN:  No rashes no nodules NEURO:  Cranial nerves II through XII grossly intact, motor grossly intact throughout PSYCH:  Cognitively intact, oriented to person place and time   EKG:  EKG is ordered today. The ekg ordered 02/07/2019 demonstrates sinus rhythm.  Rate 76 bpm. 03/11/2021: Sinus rhythm.  Rate 80 bpm.  Nonspecific T wave abnormalities  30 Day Event Monitor 02/2019:  Quality: Fair.  Baseline artifact. Predominant rhythm: sinus rhythm Average heart rate: 90 bpm Max heart rate: 201 bpm Min heart rate: 55 bpm Pauses >2.5 seconds: none  Patient reported palpitations at which time sinus rhythm was noted. Episodes of SVT were noted up to 201 bpm.  No patient triggers at this time. Sinus arrhythmia noted.   Recent Labs: 01/06/2021: ALT 15; BUN 10; Creatinine, Ser 0.69; Hemoglobin 14.0; Platelets 277; Potassium 4.0; Sodium 140; TSH 1.770    Lipid Panel    Component Value Date/Time   CHOL 204 (H) 01/06/2021 0927   TRIG 63 01/06/2021 0927   HDL 67 01/06/2021 0927   CHOLHDL 3.0 01/06/2021 0927   CHOLHDL 2.7 06/11/2015 0001   VLDL 13 06/11/2015 0001   LDLCALC 126 (H) 01/06/2021 0927      Wt Readings from Last 3  Encounters:  03/11/21 183 lb 3.2 oz (83.1 kg)  01/06/21 176 lb 9.6 oz (80.1 kg)  07/30/20 174 lb 6.4 oz (79.1 kg)      ASSESSMENT AND PLAN:  # SVT:  Symptoms have been better lately.  She is not interested in taking medication every day.  We will give her diltiazem 30 mg to take as needed if she has sustained palpitations.  Discussed continuing exercise and limiting caffeine and stress.  # Hypertension: BP better controlled since increasing amlodipine to 7.5 mg daily.  Continue with this and exercise.   Current medicines are reviewed at length with the patient today.  The patient does not have concerns regarding medicines.  The following changes have been made:  Diltiazem as needed  Labs/ tests ordered today include:  Orders Placed This Encounter  Procedures  . EKG 12-Lead     Disposition:   FU with Zykia [place] C. Oval Linsey, MD, [facility] in 1 year    Signed, Jarom Govan C. Oval Linsey, MD, [facility]  03/11/2021 10:19 AM    [place]

## 2021-03-19 ENCOUNTER — Encounter: Payer: Self-pay | Admitting: Gastroenterology

## 2021-04-30 ENCOUNTER — Ambulatory Visit: Payer: 59 | Admitting: Gastroenterology

## 2021-06-17 ENCOUNTER — Encounter: Payer: Self-pay | Admitting: Gastroenterology

## 2021-06-17 ENCOUNTER — Ambulatory Visit (INDEPENDENT_AMBULATORY_CARE_PROVIDER_SITE_OTHER): Payer: 59 | Admitting: Gastroenterology

## 2021-06-17 ENCOUNTER — Other Ambulatory Visit: Payer: Self-pay

## 2021-06-17 VITALS — BP 124/82 | HR 74 | Ht 67.0 in | Wt 179.0 lb

## 2021-06-17 DIAGNOSIS — K5909 Other constipation: Secondary | ICD-10-CM

## 2021-06-17 DIAGNOSIS — R140 Abdominal distension (gaseous): Secondary | ICD-10-CM

## 2021-06-17 MED ORDER — LINACLOTIDE 72 MCG PO CAPS: 72.0000 ug | ORAL_CAPSULE | Freq: Every day | ORAL | 0 refills | Status: DC

## 2021-06-17 NOTE — Patient Instructions (Signed)
If you are age 46 or older, your body mass index should be between 23-30. Your Body mass index is 28.04 kg/m. If this is out of the aforementioned range listed, please consider follow up with your Primary Care Provider.  If you are age 63 or younger, your body mass index should be between 19-25. Your Body mass index is 28.04 kg/m. If this is out of the aformentioned range listed, please consider follow up with your Primary Care Provider.   __________________________________________________________  The [facility] providers would like to encourage you to use [facility] to communicate with providers for non-urgent requests or questions.  Due to long hold times on the telephone, sending your provider a message by [facility] may be a faster and more efficient way to get a response.  Please allow 48 business hours for a response.  Please remember that this is for non-urgent requests.   Please purchase the following medications over the counter and take as directed: Miralax - Use once daily and titrate as needed  We have given you samples of the following medication to take: Linzess 72 mcg: You can try if the Miralax is not effective. Take once in the morning before breakfast.  Please discontinue your probiotic  You will be due for a recall colonoscopy in 12-2025. We will send you a reminder in the mail when it gets closer to that time.  Thank you for entrusting me with your care and for choosing [facility], Dr. Darien Cellar

## 2021-06-17 NOTE — Progress Notes (Signed)
HPI :  45 year old female with a history of chronic constipation, migraine headache, hypertension, referred here by Chana Bode, PA C to reestablish care for chronic constipation.  I last saw her January 2017.  She has been dealing with constipation for the past 6 to 7 years or so.  Some days she can have regular bowel habits however she has times where she does not have a bowel movement for upwards of 5 to 7 days at a time.  She eats really healthy with salads and healthy foods, however despite her dietary intake she continues to have problems with constipation.  She has used over-the-counter stool softeners in the past, probiotics, Dulcolax, none of those really helped much.  She has used MiraLAX in the past but not routinely.  No blood in her stool.  She does feel bloated if she does not have a bowel movement for several days.  She is prescribed diltiazem as needed for palpitations but does not take that.  No family history of colon cancer.  She does not feel like she strains or has a hard time eliminating stool when it reaches her rectum.  She was placed on regular dose Linzess in the past however this was quite strong for her and caused diarrhea and she did not like the way it made her feel.  She had a colonoscopy with me in January 2017.  No concerning findings noted, 1 benign hyperplastic polyp. she has had normal thyroid studies and CBC historically.      Colonoscopy 12/29/2015 - There was a 30mm sessile polyp in the sigmoid colon, grossly consistent with a benign hyperplastic polyp, but was removed with cold forceps. The remainder of the examined colon was normal without polyps or inflammatory changes. The ileum was intubated and was normal. Retroflexion was not performed due to a narrow rectal vault. The time to cecum = 3.3 Withdrawal time = 11.4 The scope was withdrawn and the procedure completed.  Surgical [P], sigmoid, polyp - HYPERPLASTIC POLYP(X1). - THERE IS NO EVIDENCE OF  MALIGNANCY.  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, [facility]   Seasonal allergic rhinitis    allergy testing 06/2013 with [facility]   Type A blood, Rh negative    Vitamin D deficiency      Past Surgical History:  Procedure Laterality Date   ABDOMINOPLASTY  12/2019   COLONOSCOPY  12/2015   polyp.   [place], Dr. Doristine Devoid   COLPOSCOPY  2013   LGSIL, f/u q50mo as of 6/14   TOE SURGERY     hammer toe 29th, 55th, bilat   Family History  Problem Relation Age of Onset   Hyperlipidemia Mother    Migraines Mother    Hypertension Mother    Diabetes Mother    Heart disease Father 30       died of MI   Other Father        quesionable drug abuse   Hypertension Sister    Hypertension Brother    Hypertension Sister    Hyperlipidemia Maternal Grandmother    Diabetes Maternal Grandmother    Cancer Maternal Grandmother        breast with mets   Heart disease Sister        CHF  due to hyperthyroid   Hyperthyroidism Sister    Cancer Maternal Aunt        breast   Cancer Maternal Aunt        breast   Stroke Neg Hx    Colon cancer Neg Hx    Social History   Tobacco Use   Smoking status: Never   Smokeless tobacco: Never  Vaping Use   Vaping Use: Never used  Substance Use Topics   Alcohol use: Yes    Alcohol/week: 2.0 standard drinks    Types: 2 Glasses of wine per week   Drug use: No   Current Outpatient Medications  Medication Sig Dispense Refill   amLODipine (NORVASC) 5 MG tablet Take 1.5 tablets (7.5 mg total) by mouth daily. 135 tablet 3   cholecalciferol (VITAMIN D3) 25 MCG (1000 UNIT) tablet Take 2 tablets (2,000 Units total) by mouth daily. 180 tablet 3   Cyanocobalamin (VITAMIN B 12 PO) Take by mouth daily.     diltiazem (CARDIZEM) 30 MG tablet  Take 1 tablet (30 mg total) by mouth every 6 (six) hours as needed. 90 tablet 3   levonorgestrel (MIRENA) 20 MCG/24HR IUD 1 each by Intrauterine route once.     Omega-3 Fatty Acids (OMEGA 3 500 PO) Take by mouth daily.     Probiotic Product (PROBIOTIC DAILY PO) Take by mouth.     No current facility-administered medications for this visit.   Allergies  Allergen Reactions   Flagyl [Metronidazole] Anaphylaxis   Vicodin [Hydrocodone-Acetaminophen]     itching   Hydrocodone-Acetaminophen Itching   Metoprolol     INCREASED HR    Oxycodone-Acetaminophen Itching   Percocet [Oxycodone-Acetaminophen] Itching   Tramadol Itching     Review of Systems: All systems reviewed and negative except where noted in HPI.   Lab Results  Component Value Date   WBC 4.8 01/06/2021   HGB 14.0 01/06/2021   HCT 41.4 01/06/2021   MCV 96 01/06/2021   PLT 277 01/06/2021    Lab Results  Component Value Date   TSH 1.770 01/06/2021     Physical Exam: BP 124/82   Pulse 74   Ht 5\' 7"  (1.702 m)   Wt 179 lb (81.2 kg)   SpO2 97%   BMI 28.04 kg/m  Constitutional: Pleasant,well-developed, female in no acute distress. HEENT: Normocephalic and atraumatic. Conjunctivae are normal. No scleral icterus. Neck supple.  Cardiovascular: Normal rate, regular rhythm.  Pulmonary/chest: Effort normal and breath sounds normal. No wheezing, rales or rhonchi. Abdominal: Soft, nondistended, nontender.  There are no masses palpable.  Extremities: no edema Lymphadenopathy: No cervical adenopathy noted. Neurological: Alert and oriented to person place and time. Skin: Skin is warm and dry. No rashes noted. Psychiatric: Normal mood and affect. Behavior is normal.   ASSESSMENT AND PLAN: 45 year old female here to reestablish care regarding the following:  Chronic constipation Bloating  Ongoing persistent symptoms as outlined above.  Colonoscopy reassuring as well as lab work.  She does not have symptoms typical for  outlet dysfunction, suspect very likely she has slow transit constipation we discussed this for a bit and treatment options.  Recommend regular use of MiraLAX at least once daily and titrate up additionally as needed to see if this will provide some benefit without leading to loose stools.  She had a trial of regular dose Linzess in the past which appeared too strong for her, I offered her a lower dose of 72 and half micrograms a day to see if that  might help and be more tolerable.  She took some samples of this today and will see which regimen she prefers.  I think she can stop her probiotic as I do not think that is helping her much and could contribute to bloating.  She eats a lot of vegetables and fruits otherwise which could contribute to bloating, provided her a low FODMAP diet to see if that may help reduce some of her high risk foods in that light.  Otherwise her colonoscopy is up-to-date not due until January 2027.  She can follow-up with me as needed in the interim  Plan: - Miralax once daily and titrate up as needed - coupons given - trial of low dose Linzess 72.71mcg / day PRN - stop probiotic - trial of low fodmap in regards to bloating symptoms with certain vegetables, avoid trigger foods - Colonoscopy Jan 2027 or consider sooner with issues  [place] Cellar, MD [facility]  CC: Carlena Hurl, PA-C

## 2021-08-19 ENCOUNTER — Encounter: Payer: Self-pay | Admitting: Gastroenterology

## 2021-08-19 ENCOUNTER — Ambulatory Visit (INDEPENDENT_AMBULATORY_CARE_PROVIDER_SITE_OTHER): Payer: 59 | Admitting: Gastroenterology

## 2021-08-19 VITALS — BP 130/88 | HR 91 | Ht 67.0 in | Wt 173.0 lb

## 2021-08-19 DIAGNOSIS — R194 Change in bowel habit: Secondary | ICD-10-CM

## 2021-08-19 DIAGNOSIS — Z800 Family history of malignant neoplasm of digestive organs: Secondary | ICD-10-CM

## 2021-08-19 DIAGNOSIS — R630 Anorexia: Secondary | ICD-10-CM

## 2021-08-19 NOTE — Progress Notes (Signed)
HPI :  45 year old female here for follow-up visit for change in bowel habits and loss of appetite.  Please see recent clinic note on July 13 for her most recent evaluation.  She has a history of chronic constipation for which we have managed her with MiraLAX and Linzess in the past.  Her last colonoscopy was in 2017 with no concerning lesions.  She states her father passed away suddenly 06-18-2023 from a heart attack at the age of 83 and has been very stressful for her family.  Her mother had been suffering from weight loss and got up into the hospital very recently had a CT scan done showing metastatic, stage IV pancreatic cancer.  Her mother is age 19 years old.  She has been very stressful since his diagnosis.  She has not been eating well and has a very poor appetite.  She states since this news happened she has been having some loose stools after she eats for the past 2 weeks or so.  Will have 1-2 bowel movements per day.  She denies any sick contacts or fevers.  No recent antibiotic use.  She has some mild cramping in her stomach with this and with eating but no nausea or vomiting.  She states over the past few days her symptoms have gotten better slightly.  She is anxious about this due to her history of constipation and this is abnormal for her.  She has stopped MiraLAX and Linzess.  She does feel like she is internalizing her emotions.  Her grandmother and aunts have had breast cancer.  Her mother is undergoing genetic testing.  She inquires about screening for pancreatic cancer.    Colonoscopy 12/29/2015 - There was a 7m sessile polyp in the sigmoid colon, grossly consistent with a benign hyperplastic polyp, but was removed with cold forceps. The remainder of the examined colon was normal without polyps or inflammatory changes. The ileum was intubated and was normal. Retroflexion was not performed due to a narrow rectal vault. The time to cecum = 3.3 Withdrawal time = 11.4 The scope was withdrawn  and the procedure completed.   Surgical [P], sigmoid, polyp - HYPERPLASTIC POLYP(X1). - THERE IS NO EVIDENCE OF MALIGNANCY   Past Medical History:  Diagnosis Date   Anaphylactic reaction 7/14   with urticaria; Flagyl   Constipation    Hypertension 2016   IUD (intrauterine device) in place 2019   Mirena   Migraine    Routine gynecological examination    [facility]   Seasonal allergic rhinitis    allergy testing 06/2013 with [facility]   Type A blood, Rh negative    Vitamin D deficiency      Past Surgical History:  Procedure Laterality Date   ABDOMINOPLASTY  12/2019   COLONOSCOPY  12/2015   polyp.   [place], Dr. ADoristine Devoid  COLPOSCOPY  2013   LGSIL, f/u q659mos of 6/14   TOE SURGERY     hammer toe 4t34th5th, bilat   Family History  Problem Relation Age of Onset   Pancreatic cancer Mother 6935 Hyperlipidemia Mother    Migraines Mother    Hypertension Mother    Diabetes Mother    Heart disease Father [id number]     died of MI   Other Father        quesionable drug abuse   Hypertension Sister    Hypertension Sister    Heart disease Sister  CHF due to hyperthyroid   Hyperthyroidism Sister    Hypertension Brother    Hyperlipidemia Maternal Grandmother    Diabetes Maternal Grandmother    Cancer Maternal Grandmother        breast with mets   Cancer Maternal Aunt        breast   Cancer Maternal Aunt        breast   Stroke Neg Hx    Colon cancer Neg Hx    Social History   Tobacco Use   Smoking status: Never   Smokeless tobacco: Never  Vaping Use   Vaping Use: Never used  Substance Use Topics   Alcohol use: Yes    Alcohol/week: 2.0 standard drinks    Types: 2 Glasses of wine per week   Drug use: No   Current Outpatient Medications  Medication Sig Dispense Refill   amLODipine (NORVASC) 5 MG tablet Take 1.5 tablets (7.5 mg total) by mouth daily. 135 tablet 3   cholecalciferol (VITAMIN D3) 25 MCG (1000 UNIT) tablet Take 2 tablets  (2,000 Units total) by mouth daily. 180 tablet 3   Cyanocobalamin (VITAMIN B 12 PO) Take by mouth daily.     diltiazem (CARDIZEM) 30 MG tablet Take 1 tablet (30 mg total) by mouth every 6 (six) hours as needed. 90 tablet 3   levonorgestrel (MIRENA) 20 MCG/24HR IUD 1 each by Intrauterine route once.     Multiple Vitamin (MULTIVITAMIN) tablet Take 1 tablet by mouth daily.     Turmeric (QC TUMERIC COMPLEX PO) Take by mouth. Tumeric with Biopene Takes 1500 daily     No current facility-administered medications for this visit.   Allergies  Allergen Reactions   Flagyl [Metronidazole] Anaphylaxis   Vicodin [Hydrocodone-Acetaminophen]     itching   Hydrocodone-Acetaminophen Itching   Metoprolol     INCREASED HR    Oxycodone-Acetaminophen Itching   Percocet [Oxycodone-Acetaminophen] Itching   Tramadol Itching     Review of Systems: All systems reviewed and negative except where noted in HPI.   Lab Results  Component Value Date   WBC 4.8 01/06/2021   HGB 14.0 01/06/2021   HCT 41.4 01/06/2021   MCV 96 01/06/2021   PLT 277 01/06/2021    Lab Results  Component Value Date   CREATININE 0.69 01/06/2021   BUN 10 01/06/2021   NA 140 01/06/2021   K 4.0 01/06/2021   CL 101 01/06/2021   CO2 24 01/06/2021    Lab Results  Component Value Date   ALT 15 01/06/2021   AST 19 01/06/2021   ALKPHOS 97 01/06/2021   BILITOT 0.8 01/06/2021     Physical Exam: BP 130/88   Pulse 91   Ht '5\' 7"'$  (1.702 m)   Wt 173 lb (78.5 kg)   SpO2 99%   BMI 27.10 kg/m  Constitutional: Pleasant,well-developed, female in no acute distress. Neurological: Alert and oriented to person place and time. Psychiatric: Normal mood and affect. Behavior is normal.   ASSESSMENT AND PLAN: 44 year old female here for reassessment of following:  Change in bowel habits Loss of appetite  Family history of pancreatic cancer   As above, patient with significant life stressors with father passing away unexpectedly in  June and now her mother being diagnosed with stage IV pancreatic cancer, awaiting to start chemotherapy.  Patient has been experiencing a lot of stress and anxiety with this, now loss of appetite with change in bowel habits.  Seems to be doing better in recent days.  Reassured the  patient the symptoms are rather acute and correlate well with timeline of her life stressors and are likely related.  If she has persistent changes that do not resolve over the next few weeks or worsening diarrhea, poor p.o. intake she should contact me but she thinks feeling better recently.  She can use Imodium as needed for loose stool.  If any fevers I would check infectious work-up, she has not had that.  We discussed that we should await her mother's genetic testing and see if she has any mutations that would put her at risk for pancreatic cancer, if so we may consider genetic testing for the patient.  Otherwise with 1 first-degree relative with pancreatic cancer diagnosed in her late 16s she does not meet current screening guidelines for pancreatic cancer screening.  She understands and will contact me if questions about her mother's genetic testing to discuss if that would influence her at all.  She agreed with the plan, all questions answered.  Jolly Mango, MD [facility]

## 2021-11-04 ENCOUNTER — Other Ambulatory Visit (HOSPITAL_BASED_OUTPATIENT_CLINIC_OR_DEPARTMENT_OTHER): Payer: Self-pay

## 2021-11-04 MED ORDER — INFLUENZA VAC SPLIT QUAD 0.5 ML IM SUSY
PREFILLED_SYRINGE | INTRAMUSCULAR | 0 refills | Status: DC
  Filled 2021-11-04: qty 0.5, 1d supply, fill #0

## 2021-11-16 ENCOUNTER — Other Ambulatory Visit (HOSPITAL_BASED_OUTPATIENT_CLINIC_OR_DEPARTMENT_OTHER): Payer: Self-pay

## 2021-12-14 ENCOUNTER — Ambulatory Visit (INDEPENDENT_AMBULATORY_CARE_PROVIDER_SITE_OTHER): Payer: 59 | Admitting: Medical

## 2021-12-14 ENCOUNTER — Other Ambulatory Visit: Payer: Self-pay

## 2021-12-14 VITALS — BP 120/80 | HR 84 | Temp 98.1°F | Wt 181.8 lb

## 2021-12-14 DIAGNOSIS — M79644 Pain in right finger(s): Secondary | ICD-10-CM

## 2021-12-14 DIAGNOSIS — M545 Low back pain, unspecified: Secondary | ICD-10-CM | POA: Diagnosis not present

## 2021-12-14 LAB — POCT URINALYSIS DIP (PROADVANTAGE DEVICE)
Bilirubin, UA: NEGATIVE
Blood, UA: NEGATIVE
Glucose, UA: NEGATIVE mg/dL
Ketones, POC UA: NEGATIVE mg/dL
Leukocytes, UA: NEGATIVE
Nitrite, UA: NEGATIVE
Protein Ur, POC: NEGATIVE mg/dL
Specific Gravity, Urine: 1.02
Urobilinogen, Ur: NEGATIVE
pH, UA: 6 (ref 5.0–8.0)

## 2021-12-14 NOTE — Progress Notes (Signed)
Subjective:  Christine Gardner is a 46 y.o. female who presents for Chief Complaint  Patient presents with   lower back pain    Lower back pain. Thought it was from working out but only on right side.      Here for c/o low back pain.   Having achiness in right low back to buttock, x about a week.  Has tried heat, biofreeze.  Is active and exercising in general, didn't think she caused an injury, but had been doing some heavier weight.   Works with a Clinical research associate.  No pain down leg, no numbness, no tingling.   No urinary symptoms, no cloud urine, odor, frequency or urgency.  No fever.  No nausea or vomiting, no bowel changes.  No other aggravating or relieving factors.    Has a tender spot on her right index finger.  No injury, no swelling or discoloration.  Achy when touched the last 2+ weeks.  She is right handed.  No recent injury or trauma.  No other c/o.   The following portions of the patient's history were reviewed and updated as appropriate: allergies, current medications, past family history, past medical history, past social history, past surgical history and problem list.  ROS Otherwise as in subjective above    Objective: BP 120/80    Pulse 84    Temp 98.1 F (36.7 C)    Wt 181 lb 12.8 oz (82.5 kg)    BMI 28.47 kg/m   General appearance: alert, no distress, well developed, well nourished Abdomen: +bs, soft, non tender, non distended, no masses, no hepatomegaly, no splenomegaly Back: nontender, normal ROM which is full, no deformity or swelling MSK: legs nontender, normal ROM without swelling or deformity, right index finger proximal phalanx just proximal to PIP medial posterior surface with small area of point tendnerss without swelling, discoloration or deformity.  Normal ROM and strength of fingers.   Pulses: 2+ radial pulses, 2+ pedal pulses, normal cap refill Ext: no edema    Assessment: Encounter Diagnoses  Name Primary?   Acute right-sided low back pain without sciatica  Yes   Pain of finger of right hand      Plan: Right low back pain-likely mild strain of muscle/spasm.  Advise relative rest, stretching, heat, over-the-counter ibuprofen 3 tablets or 600 mg twice daily for 3 to 5 days.  She declines muscle laxer.  If not resolving within the next week then call back  Finger pain-likely bruised.  No obvious deformity.  If not gradually improving over the next 2 weeks then plan for x-ray for further evaluation  Urinalysis unremarkable today.  Christine Gardner was seen today for lower back pain.  Diagnoses and all orders for this visit:  Acute right-sided low back pain without sciatica -     POCT Urinalysis DIP (Proadvantage Device)  Pain of finger of right hand    Follow up: prn

## 2021-12-23 ENCOUNTER — Telehealth: Payer: Self-pay | Admitting: Cardiovascular Disease

## 2021-12-23 ENCOUNTER — Encounter (HOSPITAL_BASED_OUTPATIENT_CLINIC_OR_DEPARTMENT_OTHER): Payer: Self-pay | Admitting: Cardiovascular Disease

## 2021-12-23 ENCOUNTER — Encounter: Payer: Self-pay | Admitting: Internal Medicine

## 2021-12-23 NOTE — Telephone Encounter (Signed)
Patient sent in mychart message saying she recevied our VM and has been trying to call back, but could not get through. Patient has an appointment scheduled on 1/20 with Dr. Oval Linsey. Advised patient that is she was having active chest pain or shortness of breath she needed to be evaluated in the ED instead of waiting until Friday, but if not currently having symptoms we would see her in the office on Friday!

## 2021-12-23 NOTE — Telephone Encounter (Signed)
Patient c/o Palpitations:  High priority if patient c/o lightheadedness, shortness of breath, or chest pain  How long have you had palpitations/irregular HR/ Afib? Are you having the symptoms now? 3 weeks ago; yes   Are you currently experiencing lightheadedness, SOB or CP? No   Do you have a history of afib (atrial fibrillation) or irregular heart rhythm? No; has SVT  Have you checked your BP or HR? (document readings if available): 122/85; 89  Are you experiencing any other symptoms? Medium pressure in headache

## 2021-12-23 NOTE — Telephone Encounter (Signed)
Called patient to review telephone encounter. No answer, Left message with instructions to call back at [phone]      Patient has an appointment with Dr. Oval Linsey on Friday 1/20

## 2021-12-25 ENCOUNTER — Encounter (HOSPITAL_BASED_OUTPATIENT_CLINIC_OR_DEPARTMENT_OTHER): Payer: Self-pay | Admitting: Cardiovascular Disease

## 2021-12-25 ENCOUNTER — Other Ambulatory Visit: Payer: Self-pay

## 2021-12-25 ENCOUNTER — Ambulatory Visit (HOSPITAL_BASED_OUTPATIENT_CLINIC_OR_DEPARTMENT_OTHER): Payer: 59 | Admitting: Cardiovascular Disease

## 2021-12-25 DIAGNOSIS — I10 Essential (primary) hypertension: Secondary | ICD-10-CM

## 2021-12-25 DIAGNOSIS — I471 Supraventricular tachycardia: Secondary | ICD-10-CM

## 2021-12-25 MED ORDER — DILTIAZEM HCL 60 MG PO TABS: 60.0000 mg | ORAL_TABLET | Freq: Four times a day (QID) | ORAL | 5 refills | Status: AC | PRN

## 2021-12-25 NOTE — Progress Notes (Signed)
Cardiology Office Note   Date:  12/25/2021   ID:  Christine Gardner, DOB [birth year], MRN [id number]  PCP:  Carlena Hurl, PA-C  Cardiologist:   Skeet Latch, MD   No chief complaint on file.    History of Present Illness: Christine Gardner is a 46 y.o. female with hypertension, SVT and PACs who is being seen today for follow-up.  She was initially seen in 2020 for tthe evaluation of palpitations.  Christine Gardner has experienced palpitations for years.  This occurred in 2016 and she wore an ambulatory monitor.  During that time she pressed the button multiple times but no arrhythmias were noted.  Since then it seems to be getting a lot worse.  In the week before Christmas 2019 she had palpitations that were incessant for approximately a week.  She wore a monitor again 02/2019 and had episodes of SVT with rates up to 201 bpm.  She was started on metoprolol.  She did not tolerate it was switched to diltiazem but never took it.  At her last appointment she was doing better after increasing amlodipine.  For the last few weeks she has been experiencing more SVT.  The episodes have been occurring more frequently and didn't stop.  She had stopped taking her supplements. She started back taking them and hasn't had any more arrhythmias since then.  She has been under more stress but doesn't think it has been contributing.  She tried taking diltiazem but had no relief.  Prior to those weeks the episode were very sporadic.  She has been exercising a couple days per week and feels well with exercise.  She has no LE edema, orthopnea or PND.   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, [facility] [place] [facility]   Seasonal allergic rhinitis    allergy testing 06/2013 with [place]   Type A blood, Rh negative     Vitamin D deficiency     Past Surgical History:  Procedure Laterality Date   ABDOMINOPLASTY  12/2019   COLONOSCOPY  12/2015   polyp.   [place], Dr. Doristine Devoid   COLPOSCOPY  2013   LGSIL, f/u q106mo as of 6/14   TOE SURGERY     hammer toe 4th, 5th, bilat     Current Outpatient Medications  Medication Sig Dispense Refill   amLODipine (NORVASC) 5 MG tablet Take 1.5 tablets (7.5 mg total) by mouth daily. 135 tablet 3   Cholecalciferol (VITAMIN D) 125 MCG (5000 UT) CAPS Take 1 capsule by mouth daily at 12 noon.     Cyanocobalamin (VITAMIN B 12 PO) Take 5,000 mcg by mouth daily.     levonorgestrel (MIRENA) 20 MCG/24HR IUD 1 each by Intrauterine route once.     Multiple Vitamin (MULTIVITAMIN) tablet Take 1 tablet by mouth daily.     Turmeric (QC TUMERIC COMPLEX PO) Take by mouth. Tumeric with Biopene Takes 1500 daily     diltiazem (CARDIZEM) 60 MG tablet Take 1 tablet (60 mg total) by mouth every 6 (six) hours as needed. 30 tablet 5   No current facility-administered medications for this visit.    Allergies:   Flagyl [metronidazole], Vicodin [hydrocodone-acetaminophen], Hydrocodone-acetaminophen, Metoprolol, Oxycodone-acetaminophen, Percocet [oxycodone-acetaminophen], and Tramadol    Social History:  The patient  reports that she has never smoked. She has never used smokeless tobacco. She reports current alcohol use of about 2.0 standard drinks per week. She reports that she does not use drugs.   Family History:  The patient's family history includes Cancer in her maternal aunt, maternal aunt, and maternal grandmother; Diabetes in her maternal grandmother and mother; Heart disease in her sister; Heart disease (age of onset: 53) in her father; Hyperlipidemia in her maternal grandmother and mother; Hypertension in her brother, mother, sister, and sister; Hyperthyroidism in her sister; Migraines in her mother; Other in her father; Pancreatic cancer (age of onset: 1) in her mother.    ROS:   Please see the history of present illness.   Otherwise, review of systems are positive for none.   All other systems are reviewed and negative.    PHYSICAL EXAM: VS:  BP 130/82    Pulse 88    Ht 5\' 7"  (1.702 m)    Wt 183 lb (83 kg)    BMI 28.66 kg/m  , BMI Body mass index is 28.66 kg/m. GENERAL:  Well appearing HEENT:  Pupils equal round and reactive, fundi not visualized, oral mucosa unremarkable NECK:  No jugular venous distention, waveform within normal limits, carotid upstroke brisk and symmetric, no bruits LUNGS:  Clear to auscultation bilaterally HEART:  RRR.  PMI not displaced or sustained,S1 and S2 within normal limits, no S3, no S4, no clicks, no rubs, no murmurs ABD:  Flat, positive bowel sounds normal in frequency in pitch, no bruits, no rebound, no guarding, no midline pulsatile mass, no hepatomegaly, no splenomegaly EXT:  2 plus pulses throughout, no edema, no cyanosis no clubbing SKIN:  No rashes no nodules NEURO:  Cranial nerves II through XII grossly intact, motor grossly intact throughout PSYCH:  Cognitively intact, oriented to person place and time   EKG:  EKG is ordered today. The ekg ordered 02/07/2019 demonstrates sinus rhythm.  Rate 76 bpm. 03/11/2021: Sinus rhythm.  Rate 80 bpm.  Nonspecific T wave abnormalities  30 Day Event Monitor 02/2019:   Quality: Fair.  Baseline artifact. Predominant rhythm: sinus rhythm Average heart rate: 90 bpm Max heart rate: 201 bpm Min heart rate: 55 bpm Pauses >2.5 seconds: none   Patient reported palpitations at which time sinus rhythm was noted. Episodes of SVT were noted up to 201 bpm.  No patient triggers at this time. Sinus arrhythmia noted.   Recent Labs: 01/06/2021: ALT 15; BUN 10; Creatinine, Ser 0.69; Hemoglobin 14.0; Platelets 277; Potassium 4.0; Sodium 140; TSH 1.770    Lipid Panel    Component Value Date/Time   CHOL 204 (H) 01/06/2021 0927   TRIG 63 01/06/2021 0927   HDL 67 01/06/2021 0927   CHOLHDL 3.0  01/06/2021 0927   CHOLHDL 2.7 06/11/2015 0001   VLDL 13 06/11/2015 0001   LDLCALC 126 (H) 01/06/2021 0927      Wt Readings from Last 3 Encounters:  12/25/21 183 lb (83 kg)  12/14/21 181 lb 12.8 oz (82.5 kg)  08/19/21 173 lb (78.5 kg)      ASSESSMENT AND PLAN:  Essential hypertension BP has been well-controlled at home.  DBP was initially elevated but improved on repeat.  Continue amlodipine.   PSVT (paroxysmal supraventricular tachycardia) ([place]) She is having more frequent episodes of SVT.  She has not tolerated metoprolol and diltiazem did not help.  I did tell her that she can take diltiazem up to 60 mg as needed for episodes.  We will have  referred her to EP for consideration of ablation.  I think she would be a good candidate.  I did review her heart rhythm strips from her smart watch.  These all demonstrated sinus and sinus rhythm with PACs.  However these were not taken in the setting of her SVT episodes.    Current medicines are reviewed at length with the patient today.  The patient does not have concerns regarding medicines.  The following changes have been made:  Diltiazem up to 60mg  prn  Labs/ tests ordered today include:   Orders Placed This Encounter  Procedures   Ambulatory referral to Cardiac Electrophysiology   EKG 12-Lead     Disposition:   FU with Jakyra Kenealy C. Oval Linsey, MD, [facility] in 6 months.   Signed, Mckell Riecke C. Oval Linsey, MD, [employer]  12/25/2021 6:29 PM    [place]

## 2021-12-25 NOTE — Patient Instructions (Signed)
Medication Instructions:  INCREASE YOUR DILTIAZEM TO 60 MG AS NEEDED FOR FAST HEART RATE    *If you need a refill on your cardiac medications before your next appointment, please call your pharmacy*  Lab Work: NONE  Testing/Procedures: NONE  Follow-Up: At [facility], you and your health needs are our priority.  As part of our continuing mission to provide you with exceptional heart care, we have created designated Provider Care Teams.  These Care Teams include your primary Cardiologist (physician) and Advanced Practice Providers (APPs -  Physician Assistants and Nurse Practitioners) who all work together to provide you with the care you need, when you need it.  We recommend signing up for the patient portal called "MyChart".  Sign up information is provided on this After Visit Summary.  MyChart is used to connect with patients for Virtual Visits (Telemedicine).  Patients are able to view lab/test results, encounter notes, upcoming appointments, etc.  Non-urgent messages can be sent to your provider as well.   To learn more about what you can do with MyChart, go to [email or web address].    Your next appointment:   6 month(s)  The format for your next appointment:   In Person  Provider:   Skeet Latch, MD   You have been referred to ELECTROPHYSIOLOGIST

## 2021-12-25 NOTE — Assessment & Plan Note (Signed)
BP has been well-controlled at home.  DBP was initially elevated but improved on repeat.  Continue amlodipine.

## 2021-12-25 NOTE — Assessment & Plan Note (Signed)
She is having more frequent episodes of SVT.  She has not tolerated metoprolol and diltiazem did not help.  I did tell her that she can take diltiazem up to 60 mg as needed for episodes.  We will have referred her to EP for consideration of ablation.  I think she would be a good candidate.  I did review her heart rhythm strips from her smart watch.  These all demonstrated sinus and sinus rhythm with PACs.  However these were not taken in the setting of her SVT episodes.

## 2022-01-18 ENCOUNTER — Ambulatory Visit (INDEPENDENT_AMBULATORY_CARE_PROVIDER_SITE_OTHER): Payer: 59 | Admitting: Medical

## 2022-01-18 ENCOUNTER — Other Ambulatory Visit: Payer: Self-pay

## 2022-01-18 ENCOUNTER — Encounter: Payer: Self-pay | Admitting: Medical

## 2022-01-18 VITALS — BP 110/70 | HR 78 | Ht 67.0 in | Wt 182.8 lb

## 2022-01-18 DIAGNOSIS — Z800 Family history of malignant neoplasm of digestive organs: Secondary | ICD-10-CM

## 2022-01-18 DIAGNOSIS — Z13220 Encounter for screening for lipoid disorders: Secondary | ICD-10-CM

## 2022-01-18 DIAGNOSIS — J301 Allergic rhinitis due to pollen: Secondary | ICD-10-CM | POA: Diagnosis not present

## 2022-01-18 DIAGNOSIS — Z975 Presence of (intrauterine) contraceptive device: Secondary | ICD-10-CM

## 2022-01-18 DIAGNOSIS — E559 Vitamin D deficiency, unspecified: Secondary | ICD-10-CM | POA: Diagnosis not present

## 2022-01-18 DIAGNOSIS — Z0000 Encounter for general adult medical examination without abnormal findings: Secondary | ICD-10-CM

## 2022-01-18 DIAGNOSIS — K5900 Constipation, unspecified: Secondary | ICD-10-CM

## 2022-01-18 DIAGNOSIS — R002 Palpitations: Secondary | ICD-10-CM | POA: Diagnosis not present

## 2022-01-18 DIAGNOSIS — Z636 Dependent relative needing care at home: Secondary | ICD-10-CM | POA: Insufficient documentation

## 2022-01-18 DIAGNOSIS — I10 Essential (primary) hypertension: Secondary | ICD-10-CM

## 2022-01-18 DIAGNOSIS — R4586 Emotional lability: Secondary | ICD-10-CM | POA: Insufficient documentation

## 2022-01-18 NOTE — Progress Notes (Signed)
Subjective:   HPI  Christine Gardner is a 46 y.o. female who presents for a complete physical.  Preventative care: Christine Gardner vision/optometry Dr. Joni Gardner, dentist  [facility], Christine Gardner Dr. Skeet Gardner, cardiology Dr. Waymon Gardner, Christine Eng, PA-C here for primary care   Concerns: Cardiology recently for palpitations, seeing electrophysiologist for consult  The last few years have been difficult in terms of stress.  Between her and her husband they have had 40 family members passed away in the last 3 years.  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  She is takes her to her appointments and helps her out financially which has been a burden.  None of her siblings are helping Christine Gardner.   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  Just feels overwhelmed sometimes.  She plans to seek counseling soon.  Otherwise normal state of health  Reviewed their medical, surgical, family, social, medication, and allergy history and updated chart as appropriate.  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   [place], Dr. Doristine Gardner   COLPOSCOPY  2013   LGSIL, f/u q81mo as of 6/14   TOE SURGERY     hammer toe 4th, 5th, bilat    Social History   Socioeconomic History   Marital status: Married    Spouse name: Not on file   Number of children: 3   Years of education: Not on file   Highest education  level: Not on file  Occupational History   Not on file  Tobacco Use   Smoking status: Never   Smokeless tobacco: Never  Vaping Use   Vaping Use: Never used  Substance and Sexual Activity   Alcohol use: Yes    Alcohol/week: 4.0 standard drinks    Types: 4 Glasses of wine per week   Drug use: No   Sexual activity: Yes    Birth control/protection: I.U.D.    Comment: Christine Gardner /vas   Other Topics Concern   Not on file  Social History Narrative   Divorced, remarried 08/2018.  3 adult children, 2 grandchild.  Exercise works out with trainer 2 x per week, and running group.   Some weights.  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  08/2017 opened up [employer].   Christian.  As of 01/2022   Social Determinants of Health   Financial Resource Strain: Not on file  Food Insecurity: Not on file  Transportation Needs: Not on file  Physical Activity: Not on file  Stress: Not on file  Social Connections: Not on file  Intimate Partner Violence: Not on file    Family History  Problem Relation Age of Onset   Pancreatic cancer Mother 33   Hyperlipidemia Mother    Migraines Mother    Hypertension Mother    Diabetes Mother  Heart disease Father 64       died of MI   Other Father        quesionable drug abuse   Hypertension Sister    Hypertension Sister    Heart disease Sister        CHF due to hyperthyroid   Hyperthyroidism Sister    Hypertension Brother    Hyperlipidemia Maternal Grandmother    Diabetes Maternal Grandmother    Cancer Maternal Grandmother        breast with mets   Cancer Maternal Aunt        breast   Cancer Maternal Aunt        breast   Stroke Neg Hx    Colon cancer Neg Hx      Current Outpatient Medications:    amLODipine (NORVASC) 5 MG tablet, Take 1.5 tablets (7.5 mg total) by mouth daily., Disp: 135 tablet, Rfl: 3   Cholecalciferol (VITAMIN D) 125 MCG (5000 UT) CAPS, Take 1 capsule by mouth daily at 12 noon., Disp: , Rfl:     Cyanocobalamin (VITAMIN B 12 PO), Take 5,000 mcg by mouth daily., Disp: , Rfl:    diltiazem (CARDIZEM) 60 MG tablet, Take 1 tablet (60 mg total) by mouth every 6 (six) hours as needed., Disp: 30 tablet, Rfl: 5   levonorgestrel (MIRENA) 20 MCG/24HR IUD, 1 each by Intrauterine route once., Disp: , Rfl:    Multiple Vitamin (MULTIVITAMIN) tablet, Take 1 tablet by mouth daily., Disp: , Rfl:    Turmeric (QC TUMERIC COMPLEX PO), Take by mouth. Tumeric with Biopene Takes 1500 daily, Disp: , Rfl:    vitamin C (ASCORBIC ACID) 500 MG tablet, Take 500 mg by mouth daily., Disp: , Rfl:   Allergies  Allergen Reactions   Flagyl [Metronidazole] Anaphylaxis   Vicodin [Hydrocodone-Acetaminophen]     itching   Hydrocodone-Acetaminophen Itching   Metoprolol     INCREASED HR    Oxycodone-Acetaminophen Itching   Percocet [Oxycodone-Acetaminophen] Itching   Tramadol Itching   Review of Systems Constitutional: -fever, -chills, -sweats, -unexpected weight change, -decreased appetite, -fatigue Allergy: -sneezing, -itching, -congestion Dermatology: -changing moles, --rash, -lumps ENT: -runny nose, -ear pain, -sore throat, -hoarseness, -sinus pain, -teeth pain, - ringing in ears, -hearing loss, -nosebleeds Cardiology: -chest pain, +occasional palpitations, chronic for years, -swelling, -difficulty breathing when lying flat, -waking up short of breath Respiratory: -cough, -shortness of breath, -difficulty breathing with exercise or exertion, -wheezing, -coughing up blood Gastroenterology: -abdominal pain, -nausea, -vomiting, -diarrhea, +chronic constipation, -blood in stool, -changes in bowel movement, -difficulty swallowing or eating Hematology: -bleeding, -bruising  Musculoskeletal: -joint aches, -muscle aches, -joint swelling, -back pain, -neck pain, -cramping, -changes in gait Ophthalmology: denies vision changes, eye redness, itching, discharge Urology: -burning with urination, -difficulty urinating, -blood in  urine, -urinary frequency, -urgency, -incontinence Neurology: -headache, -weakness, -tingling, -numbness, -memory loss, -falls, +dizziness Psychology: +depressed mood, -agitation, -sleep problems     Objective:   Physical Exam  BP 110/70    Pulse 78    Ht 5\' 7"  (1.702 m)    Wt 182 lb 12.8 oz (82.9 kg)    BMI 28.63 kg/m   BP Readings from Last 3 Encounters:  01/18/22 110/70  12/25/21 130/82  12/14/21 120/80   Wt Readings from Last 3 Encounters:  01/18/22 182 lb 12.8 oz (82.9 kg)  12/25/21 183 lb (83 kg)  12/14/21 181 lb 12.8 oz (82.5 kg)     General appearance: alert, no distress, WD/WN, pleasant African-American female   Skin: tattoo  right lower flank/abdomen, tattoo upper back, tattoo lower half of back, no worrisome lesions   HENT/oral - limited exam given covid precautions  Neck: supple, no lymphadenopathy, no thyromegaly, no masses, normal ROM, no bruit Heart: RRR, normal S1, S2, no murmurs   Lungs: CTA bilaterally, no wheezes, rhonchi, or rales   Abdomen: +bs, soft, non tender, non distended, no masses, no hepatomegaly, no splenomegaly, no bruits   Back: non tender, normal ROM, no scoliosis   Musculoskeletal: upper extremities non tender, no obvious deformity, normal ROM throughout, lower extremities non tender, no obvious deformity, normal ROM throughout   Extremities: no edema, no cyanosis, no clubbing   Pulses: 2+ symmetric, upper and lower extremities, normal cap refill   Neurological: alert, oriented x 3, CN2-12 intact, strength normal upper extremities and lower extremities, sensation normal throughout, DTRs 2+ throughout, no cerebellar signs, gait normal   Psychiatric: normal affect, behavior normal, pleasant   Breast/gyn/rectal - deferred to gynecology   Assessment and Plan :    Encounter Diagnoses  Name Primary?   Encounter for health maintenance examination in adult Yes   Vitamin D deficiency    Allergic rhinitis due to pollen, unspecified seasonality     Palpitation    Constipation, unspecified constipation type    Essential hypertension    IUD (intrauterine device) in place    Screening for lipid disorders    Mood change    Caregiver burden    Family history of pancreatic cancer     Today you had a preventative care visit or wellness visit.    Topics today may have included healthy lifestyle, diet, exercise, preventative care, vaccinations, sick and well care, proper use of emergency dept and after hours care, as well as other concerns.     Recommendations: Continue to return yearly for your annual wellness and preventative care visits.  This gives [place] a chance to discuss healthy lifestyle, exercise, vaccinations, review your chart record, and perform screenings where appropriate.  I recommend you see your eye doctor yearly for routine vision care.  I recommend you see your dentist yearly for routine dental care including hygiene visits twice yearly.   Vaccination recommendations were reviewed Immunization History  Administered Date(s) Administered   Influenza-Unspecified 09/05/2021   PFIZER(Purple Top)SARS-COV-2 Vaccination 02/09/2020, 03/01/2020, 10/04/2020   Tdap 05/29/2013      Screening for cancer: Breast cancer screening: You should perform a self breast exam monthly.   We reviewed recommendations for regular mammograms and breast cancer screening.  Colon cancer screening:  I reviewed your colonoscopy on file that is up to date from 2017 Discussed screening recommendations  Cervical cancer screening: We reviewed recommendations for pap smear screening. She is seeing a new gynecologist soon  Skin cancer screening: Check your skin regularly for new changes, growing lesions, or other lesions of concern Come in for evaluation if you have skin lesions of concern.  Lung cancer screening: If you have a greater than 30 pack year history of tobacco use, then you qualify for lung cancer screening with a chest CT  scan  We currently don't have screenings for other cancers besides breast, cervical, colon, and lung cancers.  If you have a strong family history of cancer or have other cancer screening concerns, please let me know.    Bone health: Get at least 150 minutes of aerobic exercise weekly Get weight bearing exercise at least once weekly   Heart health: Get at least 150 minutes of aerobic exercise weekly Limit alcohol It  is important to maintain a healthy blood pressure and healthy cholesterol numbers   Separate significant issues discussed: Chronic constipation - long standing problem. Continue good hydration and fiber intake  Hypertension-controlled on current medication  History of palpitations, on diltiazem.  Dose recently increased to 60 mg daily.  Seeing electrophyscioogy soon per her cardiologist.  Vitamin D deficiency-continue supplement  Caregiver burden-we discussed her recent stresses.  She is seeing counseling soon which I think would be helpful.  [place] was seen today for fasting cpe.  Diagnoses and all orders for this visit:  Encounter for health maintenance examination in adult -     VITAMIN D 25 Hydroxy (Vit-D Deficiency, Fractures) -     Comprehensive metabolic panel -     CBC -     Lipid panel  Vitamin D deficiency  Allergic rhinitis due to pollen, unspecified seasonality  Palpitation  Constipation, unspecified constipation type  Essential hypertension  IUD (intrauterine device) in place  Screening for lipid disorders -     Lipid panel  Mood change  Caregiver burden  Family history of pancreatic cancer   Follow-up pending labs, yearly for physical

## 2022-01-19 LAB — COMPREHENSIVE METABOLIC PANEL
ALT: 13 IU/L (ref 0–32)
AST: 16 IU/L (ref 0–40)
Albumin/Globulin Ratio: 1.7 (ref 1.2–2.2)
Albumin: 4.8 g/dL (ref 3.8–4.8)
Alkaline Phosphatase: 86 IU/L (ref 44–121)
BUN/Creatinine Ratio: 14 (ref 9–23)
BUN: 9 mg/dL (ref 6–24)
Bilirubin Total: 0.7 mg/dL (ref 0.0–1.2)
CO2: 24 mmol/L (ref 20–29)
Calcium: 9.8 mg/dL (ref 8.7–10.2)
Chloride: 102 mmol/L (ref 96–106)
Creatinine, Ser: 0.66 mg/dL (ref 0.57–1.00)
Globulin, Total: 2.9 g/dL (ref 1.5–4.5)
Glucose: 98 mg/dL (ref 70–99)
Potassium: 4.5 mmol/L (ref 3.5–5.2)
Sodium: 139 mmol/L (ref 134–144)
Total Protein: 7.7 g/dL (ref 6.0–8.5)
eGFR: 109 mL/min/{1.73_m2} (ref >59)

## 2022-01-19 LAB — LIPID PANEL
Chol/HDL Ratio: 2.9 ratio (ref 0.0–4.4)
Cholesterol, Total: 210 mg/dL — ABNORMAL HIGH (ref 100–199)
HDL: 72 mg/dL (ref >39)
LDL Chol Calc (NIH): 128 mg/dL — ABNORMAL HIGH (ref 0–99)
Triglycerides: 56 mg/dL (ref 0–149)
VLDL Cholesterol Cal: 10 mg/dL (ref 5–40)

## 2022-01-19 LAB — CBC
Hematocrit: 41.8 % (ref 34.0–46.6)
Hemoglobin: 13.9 g/dL (ref 11.1–15.9)
MCH: 33.1 pg — ABNORMAL HIGH (ref 26.6–33.0)
MCHC: 33.3 g/dL (ref 31.5–35.7)
MCV: 100 fL — ABNORMAL HIGH (ref 79–97)
Platelets: 277 10*3/uL (ref 150–450)
RBC: 4.2 x10E6/uL (ref 3.77–5.28)
RDW: 12.3 % (ref 11.7–15.4)
WBC: 6.9 10*3/uL (ref 3.4–10.8)

## 2022-01-19 LAB — VITAMIN D 25 HYDROXY (VIT D DEFICIENCY, FRACTURES): Vit D, 25-Hydroxy: 32.2 ng/mL (ref 30.0–100.0)

## 2022-02-05 ENCOUNTER — Institutional Professional Consult (permissible substitution): Payer: 59 | Admitting: Internal Medicine

## 2022-02-25 ENCOUNTER — Other Ambulatory Visit: Payer: Self-pay | Admitting: Medical

## 2022-02-25 DIAGNOSIS — M545 Low back pain, unspecified: Secondary | ICD-10-CM

## 2022-02-26 ENCOUNTER — Ambulatory Visit
Admission: RE | Admit: 2022-02-26 | Discharge: 2022-02-26 | Disposition: A | Discharge status: Home / Self Care | Payer: Self-pay | Source: Ambulatory Visit | Attending: Medical | Admitting: Medical

## 2022-02-26 DIAGNOSIS — M545 Low back pain, unspecified: Secondary | ICD-10-CM

## 2022-03-04 ENCOUNTER — Ambulatory Visit (HOSPITAL_BASED_OUTPATIENT_CLINIC_OR_DEPARTMENT_OTHER): Payer: 59 | Admitting: Medical

## 2022-03-06 ENCOUNTER — Other Ambulatory Visit: Payer: Self-pay | Admitting: Medical

## 2022-03-31 ENCOUNTER — Other Ambulatory Visit (HOSPITAL_BASED_OUTPATIENT_CLINIC_OR_DEPARTMENT_OTHER): Payer: Self-pay | Admitting: Medical

## 2022-03-31 DIAGNOSIS — Z1231 Encounter for screening mammogram for malignant neoplasm of breast: Secondary | ICD-10-CM

## 2022-04-07 ENCOUNTER — Other Ambulatory Visit (HOSPITAL_COMMUNITY)
Admission: RE | Admit: 2022-04-07 | Discharge: 2022-04-07 | Disposition: A | Discharge status: Home / Self Care | Payer: Self-pay | Source: Ambulatory Visit | Attending: Medical | Admitting: Medical

## 2022-04-07 ENCOUNTER — Encounter (HOSPITAL_BASED_OUTPATIENT_CLINIC_OR_DEPARTMENT_OTHER): Payer: Self-pay | Admitting: Obstetrics & Gynecology

## 2022-04-07 ENCOUNTER — Ambulatory Visit (HOSPITAL_BASED_OUTPATIENT_CLINIC_OR_DEPARTMENT_OTHER): Payer: 59 | Admitting: Obstetrics & Gynecology

## 2022-04-07 VITALS — BP 160/109 | HR 50 | Ht 67.0 in | Wt 179.8 lb

## 2022-04-07 DIAGNOSIS — Z01419 Encounter for gynecological examination (general) (routine) without abnormal findings: Secondary | ICD-10-CM

## 2022-04-07 DIAGNOSIS — Z975 Presence of (intrauterine) contraceptive device: Secondary | ICD-10-CM

## 2022-04-07 DIAGNOSIS — Z124 Encounter for screening for malignant neoplasm of cervix: Secondary | ICD-10-CM | POA: Insufficient documentation

## 2022-04-07 DIAGNOSIS — Z803 Family history of malignant neoplasm of breast: Secondary | ICD-10-CM

## 2022-04-07 DIAGNOSIS — Z8742 Personal history of other diseases of the female genital tract: Secondary | ICD-10-CM

## 2022-04-07 NOTE — Progress Notes (Signed)
46 y.o. Christine Gardner Married [place] American female here for annual exam/new patient exam.   ? ?Blood pressure is elevated today.  Pt has not taken her medications for several days.  States she has this in her lunch bag and will start taking it again. ? ?Has a Mirena IUD that was placed about two year.  Has irregular bleeding.  Does sometimes have a cycle.   ? ?No LMP recorded. (Menstrual status: IUD).          ?Sexually active: Yes.    ?The current method of family planning is IUD.    ?Exercising: Yes.    Works out with Physiological scientist 2 x's weekly ?Smoker:  no ? ?Health Maintenance: ?Pap:  03/22/2012 ?History of abnormal Pap:  possible remote hx of an abnormal but normal for many years ?MMG:  scheduled for next week ?Colonoscopy:  12/29/2015, 14m hyperplastic polyp  Follow up 10 years. ?Screening Labs: done 01/2022 ? ? reports that she has never smoked. She has never used smokeless tobacco. She reports current alcohol use of about 4.0 standard drinks per week. She reports that she does not use drugs. ? ?Past Medical History:  ?Diagnosis Date  ? Anaphylactic reaction 7/14  ? with urticaria; Flagyl  ? Constipation   ? Hypertension 2016  ? IUD (intrauterine device) in place 2019  ? Mirena  ? Migraine   ? Routine gynecological examination   [facility]  ? Seasonal allergic rhinitis   ? allergy testing 06/2013 with [facility]  ? Type A blood, Rh negative   ? Vitamin D deficiency   ? ? ?Past Surgical History:  ?Procedure Laterality Date  ? ABDOMINOPLASTY  12/2019  ? COLONOSCOPY  12/2015  ? polyp.   [place], Dr. ADoristine Devoid ? COLPOSCOPY  2013  ? LGSIL, f/u q647mos of 6/14  ? TOE SURGERY    ? hammer toe 4th, 5th, bilat  ? ? ?Current Outpatient Medications  ?Medication Sig Dispense Refill  ? amLODipine (NORVASC) 5 MG tablet TAKE 1 & 1/2 TABLETS BY MOUTH DAILY. 135 tablet 1  ? Cholecalciferol (VITAMIN D) 125 MCG (5000 UT) CAPS Take 1 capsule by mouth daily at 12 noon.    ? Cyanocobalamin  (VITAMIN B 12 PO) Take 5,000 mcg by mouth daily.    ? diltiazem (CARDIZEM) 60 MG tablet Take 1 tablet (60 mg total) by mouth every 6 (six) hours as needed. 30 tablet 5  ? levonorgestrel (MIRENA) 20 MCG/24HR IUD 1 each by Intrauterine route once.    ? Multiple Vitamin (MULTIVITAMIN) tablet Take 1 tablet by mouth daily.    ? Turmeric (QC TUMERIC COMPLEX PO) Take by mouth. Tumeric with Biopene Takes 1500 daily    ? vitamin C (ASCORBIC ACID) 500 MG tablet Take 500 mg by mouth daily.    ? ?No current facility-administered medications for this visit.  ? ? ?Family History  ?Problem Relation Age of Onset  ? Pancreatic cancer Mother [id number]? Hyperlipidemia Mother   ? Migraines Mother   ? Hypertension Mother   ? Diabetes Mother   ? Heart disease Father [id number]?     died of MI  ? Other Father   ?     quesionable drug abuse  ? Hypertension Sister   ? Hypertension Sister   ? Heart disease Sister   ?     CHF due to hyperthyroid  ? Hyperthyroidism Sister   ? Hypertension Brother   ? Hyperlipidemia Maternal  Grandmother   ? Diabetes Maternal Grandmother   ? Cancer Maternal Grandmother   ?     breast with mets  ? Cancer Maternal Aunt   ?     breast  ? Cancer Maternal Aunt   ?     breast  ? Stroke Neg Hx   ? Colon cancer Neg Hx   ? ? ?Genitourinary:negative ? ?Exam:   ?BP (!) 160/109 (BP Location: Left Arm, Patient Position: Sitting, Cuff Size: Normal)   Pulse (!) 50   Ht '5\' 7"'$  (1.702 m) Comment: Reported  Wt 179 lb 12.8 oz (81.6 kg)   BMI 28.16 kg/m?   Height: '5\' 7"'$  (170.2 cm) (Reported) ? ?General appearance: alert, cooperative and appears stated age ?Head: Normocephalic, without obvious abnormality, atraumatic ?Neck: no adenopathy, supple, symmetrical, trachea midline and thyroid normal to inspection and palpation ?Lungs: clear to auscultation bilaterally ?Breasts: normal appearance, no masses or tenderness ?Heart: regular rate and rhythm ?Abdomen: soft, non-tender; bowel sounds normal; no masses,  no organomegaly ?Extremities:  extremities normal, atraumatic, no cyanosis or edema ?Skin: Skin color, texture, turgor normal. No rashes or lesions ?Lymph nodes: Cervical, supraclavicular, and axillary nodes normal. ?No abnormal inguinal nodes palpated ?Neurologic: Grossly normal ? ? ?Pelvic: External genitalia:  no lesions ?             Urethra:  normal appearing urethra with no masses, tenderness or lesions ?             Bartholins and Skenes: normal    ?             Vagina: normal appearing vagina with normal color and no discharge, no lesions ?             Cervix: no lesions ?             Pap taken: Yes.   ?Bimanual Exam:  Uterus:  normal size, contour, position, consistency, mobility, non-tender ?             Adnexa: normal adnexa and no mass, fullness, tenderness ?              Rectovaginal: Confirms ?              Anus:  normal sphincter tone, no lesions ? ?Chaperone, Octaviano Batty, CMA, was present for exam. ? ?Assessment/Plan: ?1. Well woman exam with routine gynecological exam ?- pap and HR HPV obtained today ?- MMG scheduled for next week ?- colonoscopy 2017, follow up 10 years ?- lab work done with PCP ?- vaccines reviewed/updated ? ?2. Cervical cancer screening ?- Cytology - PAP( [facility]) ? ?3. IUD (intrauterine device) in place ?- pt is going to try and get me date of IUD insertion ? ?4. History of abnormal cervical Pap smear ?- LGSIL in 2013 ? ?5. Family history of breast cancer ?- due to several family members over multiple generations, feel pt could benefit from consultation at high risk breast clinic.  Pt very comfortable with this plan.  Referral will be placed. ? ?6. Hypertension ?- pt highly encouraged to take her medication and on time. ? ? ?

## 2022-04-08 DIAGNOSIS — L819 Disorder of pigmentation, unspecified: Secondary | ICD-10-CM | POA: Insufficient documentation

## 2022-04-08 DIAGNOSIS — L6681 Central centrifugal cicatricial alopecia: Secondary | ICD-10-CM | POA: Insufficient documentation

## 2022-04-08 DIAGNOSIS — L668 Other cicatricial alopecia: Secondary | ICD-10-CM | POA: Insufficient documentation

## 2022-04-08 DIAGNOSIS — L219 Seborrheic dermatitis, unspecified: Secondary | ICD-10-CM | POA: Insufficient documentation

## 2022-04-08 DIAGNOSIS — L658 Other specified nonscarring hair loss: Secondary | ICD-10-CM | POA: Insufficient documentation

## 2022-04-08 DIAGNOSIS — L739 Follicular disorder, unspecified: Secondary | ICD-10-CM | POA: Insufficient documentation

## 2022-04-09 ENCOUNTER — Encounter (HOSPITAL_BASED_OUTPATIENT_CLINIC_OR_DEPARTMENT_OTHER): Payer: Self-pay | Admitting: Obstetrics & Gynecology

## 2022-04-09 DIAGNOSIS — Z803 Family history of malignant neoplasm of breast: Secondary | ICD-10-CM | POA: Insufficient documentation

## 2022-04-09 DIAGNOSIS — Z8742 Personal history of other diseases of the female genital tract: Secondary | ICD-10-CM | POA: Insufficient documentation

## 2022-04-09 LAB — CYTOLOGY - PAP
Comment: NEGATIVE
Diagnosis: NEGATIVE
High risk HPV: NEGATIVE

## 2022-04-15 ENCOUNTER — Other Ambulatory Visit (HOSPITAL_BASED_OUTPATIENT_CLINIC_OR_DEPARTMENT_OTHER): Payer: Self-pay | Admitting: Obstetrics & Gynecology

## 2022-04-15 DIAGNOSIS — Z803 Family history of malignant neoplasm of breast: Secondary | ICD-10-CM

## 2022-04-16 ENCOUNTER — Ambulatory Visit (HOSPITAL_BASED_OUTPATIENT_CLINIC_OR_DEPARTMENT_OTHER): Payer: Self-pay | Admitting: Radiology

## 2022-04-19 ENCOUNTER — Telehealth: Payer: Self-pay | Admitting: Cardiovascular Disease

## 2022-04-19 MED ORDER — AMLODIPINE BESYLATE 10 MG PO TABS: 10.0000 mg | ORAL_TABLET | Freq: Every day | ORAL | 3 refills | Status: DC

## 2022-04-19 NOTE — Telephone Encounter (Signed)
Pt c/o BP issue: STAT if pt c/o blurred vision, one-sided weakness or slurred speech ? ?1. What are your last 5 BP readings?  ?No reading available ? ?2. Are you having any other symptoms (ex. Dizziness, headache, blurred vision, passed out)?  ?Mild headache  ? ?3. What is your BP issue?  ? ?Patient stats her BP has been a little elevated, ranging in the upper 130's/80-90. She would like to know what Dr. Oval Linsey recommends. ? ?

## 2022-04-19 NOTE — Telephone Encounter (Signed)
Printed and given to [employer] and Dr. Oval Linsey in clinic, also routed to box.  ?

## 2022-04-19 NOTE — Telephone Encounter (Signed)
Spoke with patient and she started having dull headache about 2 weeks ago, thought allergies ?Started Allegra, no improvement in headache ?Blood pressure has been running from upper 130's-low 140's/80's-90's ?Saturday she increased her Amlodipine to 10 mg daily, this am 133/88 ? ?Will forward to Dr Oval Linsey for review  ?

## 2022-04-19 NOTE — Telephone Encounter (Signed)
Discussed with Dr Oval Linsey and ok for patient to continue the Amlodipine 10 mg daily, but f/u with PCP regarding headache  ? ?Advised patient and sent new Rx to [facility] ?

## 2022-04-23 ENCOUNTER — Institutional Professional Consult (permissible substitution): Payer: 59 | Admitting: Internal Medicine

## 2022-05-26 ENCOUNTER — Emergency Department (HOSPITAL_BASED_OUTPATIENT_CLINIC_OR_DEPARTMENT_OTHER): Payer: Self-pay

## 2022-05-26 ENCOUNTER — Emergency Department (HOSPITAL_BASED_OUTPATIENT_CLINIC_OR_DEPARTMENT_OTHER)
Admission: EM | Admit: 2022-05-26 | Discharge: 2022-05-26 | Disposition: A | Discharge status: Home / Self Care | Payer: Self-pay | Attending: Emergency Medicine | Admitting: Emergency Medicine

## 2022-05-26 ENCOUNTER — Other Ambulatory Visit: Payer: Self-pay

## 2022-05-26 ENCOUNTER — Encounter (HOSPITAL_BASED_OUTPATIENT_CLINIC_OR_DEPARTMENT_OTHER): Payer: Self-pay | Admitting: Emergency Medicine

## 2022-05-26 DIAGNOSIS — Z79899 Other long term (current) drug therapy: Secondary | ICD-10-CM | POA: Insufficient documentation

## 2022-05-26 DIAGNOSIS — N281 Cyst of kidney, acquired: Secondary | ICD-10-CM | POA: Insufficient documentation

## 2022-05-26 DIAGNOSIS — R319 Hematuria, unspecified: Secondary | ICD-10-CM

## 2022-05-26 LAB — COMPREHENSIVE METABOLIC PANEL
ALT: 17 U/L (ref 0–44)
AST: 17 U/L (ref 15–41)
Albumin: 4.6 g/dL (ref 3.5–5.0)
Alkaline Phosphatase: 73 U/L (ref 38–126)
Anion gap: 10 (ref 5–15)
BUN: 11 mg/dL (ref 6–20)
CO2: 26 mmol/L (ref 22–32)
Calcium: 10.3 mg/dL (ref 8.9–10.3)
Chloride: 105 mmol/L (ref 98–111)
Creatinine, Ser: 0.87 mg/dL (ref 0.44–1.00)
GFR, Estimated: >60 mL/min (ref >60)
Glucose, Bld: 115 mg/dL — ABNORMAL HIGH (ref 70–99)
Potassium: 3.6 mmol/L (ref 3.5–5.1)
Sodium: 141 mmol/L (ref 135–145)
Total Bilirubin: 0.7 mg/dL (ref 0.3–1.2)
Total Protein: 8.3 g/dL — ABNORMAL HIGH (ref 6.5–8.1)

## 2022-05-26 LAB — CBC WITH DIFFERENTIAL/PLATELET
Abs Immature Granulocytes: 0.04 10*3/uL (ref 0.00–0.07)
Basophils Absolute: 0 10*3/uL (ref 0.0–0.1)
Basophils Relative: 0 %
Eosinophils Absolute: 0.1 10*3/uL (ref 0.0–0.5)
Eosinophils Relative: 1 %
HCT: 39.1 % (ref 36.0–46.0)
Hemoglobin: 13.5 g/dL (ref 12.0–15.0)
Immature Granulocytes: 0 %
Lymphocytes Relative: 10 %
Lymphs Abs: 1 10*3/uL (ref 0.7–4.0)
MCH: 33.3 pg (ref 26.0–34.0)
MCHC: 34.5 g/dL (ref 30.0–36.0)
MCV: 96.3 fL (ref 80.0–100.0)
Monocytes Absolute: 0.4 10*3/uL (ref 0.1–1.0)
Monocytes Relative: 5 %
Neutro Abs: 7.8 10*3/uL — ABNORMAL HIGH (ref 1.7–7.7)
Neutrophils Relative %: 84 %
Platelets: 258 10*3/uL (ref 150–400)
RBC: 4.06 MIL/uL (ref 3.87–5.11)
RDW: 12 % (ref 11.5–15.5)
WBC: 9.3 10*3/uL (ref 4.0–10.5)
nRBC: 0 % (ref 0.0–0.2)

## 2022-05-26 LAB — PREGNANCY, URINE: Preg Test, Ur: NEGATIVE

## 2022-05-26 LAB — URINALYSIS, ROUTINE W REFLEX MICROSCOPIC
Bilirubin Urine: NEGATIVE
Glucose, UA: NEGATIVE mg/dL
Ketones, ur: NEGATIVE mg/dL
Nitrite: NEGATIVE
Protein, ur: 100 mg/dL — AB
RBC / HPF: >50 RBC/hpf — ABNORMAL HIGH (ref 0–5)
Specific Gravity, Urine: 1.018 (ref 1.005–1.030)
WBC, UA: >50 WBC/hpf — ABNORMAL HIGH (ref 0–5)
pH: 6 (ref 5.0–8.0)

## 2022-05-26 MED ORDER — DIPHENHYDRAMINE HCL 25 MG PO TABS: 25.0000 mg | ORAL_TABLET | Freq: Four times a day (QID) | ORAL | 0 refills | Status: AC | PRN

## 2022-05-26 MED ORDER — OXYCODONE HCL 5 MG PO TABS: 5.0000 mg | ORAL_TABLET | Freq: Four times a day (QID) | ORAL | 0 refills | Status: AC | PRN

## 2022-05-26 NOTE — ED Notes (Signed)
Patient transported to CT 

## 2022-05-26 NOTE — ED Triage Notes (Signed)
Pt via POV c/o right lower abdominal pain, right side flank pain, and blood in her urine. On Monday she noticed small blood clots, brown-bloody urine yesterday, and pain onset today 3 am. No fevers/chills. NO NVD.   Has experienced bloody urine approx 13 years ago. Pt followed up with [facility] was told she had a small renal cyst and symptoms resolved on its own.   Urine provided in triage is a dark brown color.

## 2022-05-26 NOTE — ED Provider Notes (Signed)
[facility] Provider Note   CSN: [id number] Arrival date & time: 05/26/22  6606     History  Chief Complaint  Patient presents with   Hematuria    Christine Gardner is a 46 y.o. female.  Patient is a 46 year old female with past medical history of renal cyst presenting for complaints of hematuria.  Patient states she has had blood clots and blood in her urine x3 days and began having right-sided abdominal pain with radiation to the right flank 2 days ago.  Abdominal and flank pain is intermittent, max severity of 8 out of 10 pain severity, and sharp in nature.  The history is provided by the patient. No language interpreter was used.  Hematuria Associated symptoms include abdominal pain. Pertinent negatives include no chest pain and no shortness of breath.       Home Medications Prior to Admission medications   Medication Sig Start Date End Date Taking? Authorizing Provider  diphenhydrAMINE (BENADRYL) 25 MG tablet Take 1 tablet (25 mg total) by mouth every 6 (six) hours as needed for itching. 02/04/59  Yes Campbell Stall P, DO  oxyCODONE (ROXICODONE) 5 MG immediate release tablet Take 1 tablet (5 mg total) by mouth every 6 (six) hours as needed for up to 3 days for severe pain. 05/26/22 12/14/30 Yes Campbell Stall P, DO  amLODipine (NORVASC) 10 MG tablet Take 1 tablet (10 mg total) by mouth daily. 04/19/22   Skeet Latch, MD  Cholecalciferol (VITAMIN D) 125 MCG (5000 UT) CAPS Take 1 capsule by mouth daily at 12 noon.    [provider]  Cyanocobalamin (VITAMIN B 12 PO) Take 5,000 mcg by mouth daily.    [provider]  diltiazem (CARDIZEM) 60 MG tablet Take 1 tablet (60 mg total) by mouth every 6 (six) hours as needed. 12/25/21   Skeet Latch, MD  levonorgestrel (MIRENA) 20 MCG/24HR IUD 1 each by Intrauterine route once.    [provider]  Multiple Vitamin (MULTIVITAMIN) tablet Take 1 tablet by mouth daily.    [provider]  Turmeric (QC TUMERIC COMPLEX PO) Take by mouth. Tumeric with Biopene Takes 1500 daily    [provider]  vitamin C (ASCORBIC ACID) 500 MG tablet Take 500 mg by mouth daily.    [provider]      Allergies    Flagyl [metronidazole], Vicodin [hydrocodone-acetaminophen], Hydrocodone-acetaminophen, Metoprolol, Oxycodone-acetaminophen, Percocet [oxycodone-acetaminophen], and Tramadol    Review of Systems   Review of Systems  Constitutional:  Negative for chills and fever.  HENT:  Negative for ear pain and sore throat.   Eyes:  Negative for pain and visual disturbance.  Respiratory:  Negative for cough and shortness of breath.   Cardiovascular:  Negative for chest pain and palpitations.  Gastrointestinal:  Positive for abdominal pain. Negative for vomiting.  Genitourinary:  Positive for flank pain and hematuria. Negative for dysuria.  Musculoskeletal:  Negative for arthralgias and back pain.  Skin:  Negative for color change and rash.  Neurological:  Negative for seizures and syncope.  All other systems reviewed and are negative.   Physical Exam Updated Vital Signs BP 126/81   Pulse 89   Temp 98.5 F (36.9 C) (Oral)   Resp 20   Ht '5\' 7"'$  (1.702 m)   Wt 78.5 kg   SpO2 99%   Breastfeeding Unknown   BMI 27.10 kg/m  Physical Exam Vitals and nursing note reviewed.  Constitutional:      General: She is not  in acute distress.    Appearance: She is well-developed.  HENT:     Head: Normocephalic and atraumatic.  Eyes:     Conjunctiva/sclera: Conjunctivae normal.  Cardiovascular:     Rate and Rhythm: Normal rate and regular rhythm.     Heart sounds: No murmur heard. Pulmonary:     Effort: Pulmonary effort is normal. No respiratory distress.     Breath sounds: Normal breath sounds.  Abdominal:     Palpations: Abdomen is soft.     Tenderness: There is no abdominal tenderness.  Musculoskeletal:        General: No swelling.     Cervical back:  Neck supple.  Skin:    General: Skin is warm and dry.     Capillary Refill: Capillary refill takes less than 2 seconds.  Neurological:     Mental Status: She is alert.  Psychiatric:        Mood and Affect: Mood normal.     ED Results / Procedures / Treatments   Labs (all labs ordered are listed, but only abnormal results are displayed) Labs Reviewed  URINALYSIS, ROUTINE W REFLEX MICROSCOPIC - Abnormal; Notable for the following components:      Result Value   Color, Urine RED (*)    APPearance CLOUDY (*)    Hgb urine dipstick LARGE (*)    Protein, ur 100 (*)    Leukocytes,Ua TRACE (*)    RBC / HPF >50 (*)    WBC, UA >50 (*)    Bacteria, UA FEW (*)    All other components within normal limits  CBC WITH DIFFERENTIAL/PLATELET - Abnormal; Notable for the following components:   Neutro Abs 7.8 (*)    All other components within normal limits  COMPREHENSIVE METABOLIC PANEL - Abnormal; Notable for the following components:   Glucose, Bld 115 (*)    Total Protein 8.3 (*)    All other components within normal limits  PREGNANCY, URINE    EKG None  Radiology CT Renal Stone Study  Result Date: 05/26/2022 CLINICAL DATA:  Flank pain with kidney stone suspected. Gross hematuria EXAM: CT ABDOMEN AND PELVIS WITHOUT CONTRAST TECHNIQUE: Multidetector CT imaging of the abdomen and pelvis was performed following the standard protocol without IV contrast. RADIATION DOSE REDUCTION: This exam was performed according to the departmental dose-optimization program which includes automated exposure control, adjustment of the mA and/or kV according to patient size and/or use of iterative reconstruction technique. COMPARISON:  None Available. FINDINGS: Lower chest:  No contributory findings. Hepatobiliary: 12 mm cyst in the lateral left lobe liver.No evidence of biliary obstruction or stone. Pancreas: Unremarkable. Spleen: Unremarkable. Adrenals/Urinary Tract: Negative adrenals. High-density at the right  renal pelvis, presumed clot. No gross mass or urinary calculus. There is right hydroureter; no hydronephrosis. Right renal cyst with intermittent thin peripheral calcification, up to 6.8 cm diameter. Simple cystic densities in the left kidney measuring up to 2.5 cm. Unremarkable bladder. Stomach/Bowel:  No obstruction. No appendicitis. Vascular/Lymphatic: No acute vascular abnormality. No mass or adenopathy. Reproductive:No pathologic findings.  IUD in expected position. Other: No ascites or pneumoperitoneum. Musculoskeletal: No acute abnormalities. IMPRESSION: 1. Clot like appearance at the right renal pelvis with right hydroureter. No underlying stone or parenchymal hemorrhage is seen, recommend urology referral and renal protocol CT with contrast after clearing of the urine. 2. Bilateral renal cysts including nearly 7 cm right renal cyst with some mural thickening and calcification but no internal hemorrhagic contents to explain #1. Electronically Signed  By: Jorje Guild M.D.   On: 05/26/2022 08:12    Procedures Procedures    Medications Ordered in ED Medications - No data to display  ED Course/ Medical Decision Making/ A&P                           Medical Decision Making Amount and/or Complexity of Data Reviewed Labs: ordered. Radiology: ordered.   16:29 AM  46 year old female with past medical history of renal cyst presenting for complaints of hematuria.  Patient is alert and oriented x3, no acute distress, afebrile, stable vital signs.  Physical exam demonstrates no reproducible tenderness.  Differential diagnosis is included but not limited to ureterolithiasis, pyelonephritis, renal cyst, and renal malignancy.  Patient offered and declined pain medication at this time.  I independently interpreted patient's labs.  Laboratory studies demonstrate stable renal function.  UA demonstrates > 50 red blood cells and > 50 white blood cells.  Trace leukocytes.  Negative for nitrates.  Low  suspicion urinary tract infection.  White blood cells likely secondary to red blood cell infiltration.    CT renal stone, as interpreted by radiologist, demonstrates: 1. Clot like appearance at the right renal pelvis with right hydroureter. No underlying stone or parenchymal hemorrhage is seen, recommend urology referral and renal protocol CT with contrast after clearing of the urine. 2. Bilateral renal cysts including nearly 7 cm right renal cyst with some mural thickening and calcification but no internal hemorrhagic contents to explain #1.  I spoke with on-call urologist with [facility] who recommends follow-up in office for CT hematuria study and/or cystoscopy.  Patient agreeable to plan.  Medication sent to pharmacy for pain control.         Final Clinical Impression(s) / ED Diagnoses Final diagnoses:  Hematuria, unspecified type  Renal cyst    Rx / DC Orders ED Discharge Orders          Ordered    oxyCODONE (ROXICODONE) 5 MG immediate release tablet  Every 6 hours PRN        05/26/22 0934    diphenhydrAMINE (BENADRYL) 25 MG tablet  Every 6 hours PRN        05/26/22 [place], Garland P, DO 00/93/81 [phone]

## 2022-05-26 NOTE — Discharge Instructions (Signed)
Please call to scheduel appointment with [facility] for further imaging and workup of hematuria

## 2022-06-02 ENCOUNTER — Other Ambulatory Visit: Payer: Self-pay | Admitting: Urology

## 2022-06-02 DIAGNOSIS — R310 Gross hematuria: Secondary | ICD-10-CM

## 2022-06-17 ENCOUNTER — Ambulatory Visit
Admission: RE | Admit: 2022-06-17 | Discharge: 2022-06-17 | Disposition: A | Discharge status: Home / Self Care | Payer: Self-pay | Source: Ambulatory Visit | Attending: Urology | Admitting: Urology

## 2022-06-17 DIAGNOSIS — R310 Gross hematuria: Secondary | ICD-10-CM

## 2022-06-17 MED ORDER — IOPAMIDOL (ISOVUE-300) INJECTION 61%
100.0000 mL | Freq: Once | INTRAVENOUS | Status: AC | PRN
  Administered 2022-06-17: 100 mL via INTRAVENOUS

## 2022-09-14 ENCOUNTER — Encounter: Payer: Self-pay | Admitting: Internal Medicine

## 2022-10-08 ENCOUNTER — Other Ambulatory Visit (HOSPITAL_COMMUNITY): Payer: Self-pay

## 2022-10-15 ENCOUNTER — Other Ambulatory Visit: Payer: Self-pay | Admitting: Medical

## 2022-10-22 MED ORDER — AMLODIPINE BESYLATE 10 MG PO TABS: 10.0000 mg | ORAL_TABLET | Freq: Every day | ORAL | 0 refills | Status: DC

## 2022-12-07 ENCOUNTER — Encounter (HOSPITAL_BASED_OUTPATIENT_CLINIC_OR_DEPARTMENT_OTHER): Payer: Self-pay | Admitting: Cardiovascular Disease

## 2022-12-07 DIAGNOSIS — I471 Supraventricular tachycardia, unspecified: Secondary | ICD-10-CM

## 2022-12-07 DIAGNOSIS — R002 Palpitations: Secondary | ICD-10-CM

## 2022-12-07 NOTE — Telephone Encounter (Signed)
Ok to place new referral for EP?

## 2022-12-13 ENCOUNTER — Ambulatory Visit (HOSPITAL_BASED_OUTPATIENT_CLINIC_OR_DEPARTMENT_OTHER)
Admission: RE | Admit: 2022-12-13 | Discharge: 2022-12-13 | Disposition: A | Discharge status: Home / Self Care | Payer: 59 | Source: Ambulatory Visit | Attending: Medical | Admitting: Medical

## 2022-12-13 DIAGNOSIS — Z1231 Encounter for screening mammogram for malignant neoplasm of breast: Secondary | ICD-10-CM

## 2022-12-13 NOTE — Progress Notes (Signed)
Results sent through MyChart

## 2023-01-05 DIAGNOSIS — R69 Illness, unspecified: Secondary | ICD-10-CM | POA: Diagnosis not present

## 2023-01-06 ENCOUNTER — Encounter (HOSPITAL_BASED_OUTPATIENT_CLINIC_OR_DEPARTMENT_OTHER): Payer: Self-pay | Admitting: Obstetrics & Gynecology

## 2023-01-06 ENCOUNTER — Ambulatory Visit (HOSPITAL_BASED_OUTPATIENT_CLINIC_OR_DEPARTMENT_OTHER): Payer: 59 | Admitting: Obstetrics & Gynecology

## 2023-01-06 VITALS — BP 125/75 | HR 87 | Ht 67.0 in | Wt 184.0 lb

## 2023-01-06 DIAGNOSIS — Z800 Family history of malignant neoplasm of digestive organs: Secondary | ICD-10-CM | POA: Diagnosis not present

## 2023-01-06 DIAGNOSIS — Z3141 Encounter for fertility testing: Secondary | ICD-10-CM | POA: Diagnosis not present

## 2023-01-06 NOTE — Progress Notes (Signed)
GYNECOLOGY  VISIT  CC:   discuss possible IUD removal  HPI: 47 y.o. [phone] Married [place] or [place] American female here for possible IUD removal.  Wants to be sure she will not get pregnant and would prefer no bleeding at this time.  Not having any significant hot flashes or night sweats.  Has occasional bleeding that is very light.  No regularity to it.    I do not know the exact date of placement but pt feels it is about three years.  Aware has a 8 year indication for pregnancy prevention.  Not have any issues with this.  Last pap smear:  04/07/2022 Last mammogram 12/13/2022  Pt's mother passed due to pancreatic cancer at age 38.  No breast cancer in family.  Pt desires genetic testing or at least, genetic counseling.  Referral will be placed.     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; Flagyl   Constipation    Hypertension 2016   IUD (intrauterine device) in place 2019   Mirena   Migraine    Seasonal allergic rhinitis    allergy testing 06/2013 with [facility]   Vitamin D deficiency     MEDS:   Current Outpatient Medications on File Prior to Visit  Medication Sig Dispense Refill   amLODipine (NORVASC) 10 MG tablet Take 1 tablet (10 mg total) by mouth daily. 90 tablet 0   Cholecalciferol (VITAMIN D) 125 MCG (5000 UT) CAPS Take 1 capsule by mouth daily at 12 noon.     Cyanocobalamin (VITAMIN B 12 PO) Take 5,000 mcg by mouth daily.     diltiazem (CARDIZEM) 60 MG tablet Take 1 tablet (60 mg total) by mouth every 6 (six) hours as needed. 30 tablet 5   diphenhydrAMINE (BENADRYL) 25 MG tablet Take 1 tablet (25 mg total) by mouth every 6 (six) hours as needed for itching. 30 tablet 0   levonorgestrel (MIRENA) 20 MCG/24HR IUD 1 each by Intrauterine route once.     Multiple Vitamin (MULTIVITAMIN) tablet Take 1 tablet by mouth daily.     Turmeric (QC TUMERIC COMPLEX PO) Take by mouth. Tumeric with Biopene Takes 1500 daily     vitamin C  (ASCORBIC ACID) 500 MG tablet Take 500 mg by mouth daily.     No current facility-administered medications on file prior to visit.    ALLERGIES: Flagyl [metronidazole], Vicodin [hydrocodone-acetaminophen], Hydrocodone-acetaminophen, Metoprolol, Oxycodone-acetaminophen, Percocet [oxycodone-acetaminophen], and Tramadol  SH:  married, non smoker  Review of Systems  Constitutional: Negative.     PHYSICAL EXAMINATION:    BP 125/75 (BP Location: Right Arm, Patient Position: Sitting, Cuff Size: Large)   Pulse 87   Ht '5\' 7"'$  (1.702 m) Comment: reported  Wt 184 lb (83.5 kg)   LMP 12/09/2022 (Approximate)   BMI 28.82 kg/m     Physical Exam Constitutional:      Appearance: Normal appearance.  Skin:    General: Skin is warm and dry.  Neurological:     General: No focal deficit present.     Mental Status: She is alert.     Assessment/Plan: 1. Fertility testing - will check [place].  Pt aware I feel this is likely low and will recommend keeping IUD if this is the case - Follicle stimulating hormone  2. Family history of pancreatic cancer - Ambulatory referral to [facility]

## 2023-01-07 ENCOUNTER — Telehealth: Payer: Self-pay | Admitting: Genetic Counselor

## 2023-01-07 LAB — FOLLICLE STIMULATING HORMONE: FSH: 54.2 m[IU]/mL

## 2023-01-07 NOTE — Telephone Encounter (Signed)
Scheduled appt per 2/1 referral. Pt is aware of appt date and time. Pt is aware to arrive 15 mins prior to appt time and to bring and updated insurance card. Pt is aware of appt location.

## 2023-01-17 DIAGNOSIS — R69 Illness, unspecified: Secondary | ICD-10-CM | POA: Diagnosis not present

## 2023-01-20 ENCOUNTER — Ambulatory Visit (INDEPENDENT_AMBULATORY_CARE_PROVIDER_SITE_OTHER): Payer: 59 | Admitting: Medical

## 2023-01-20 VITALS — BP 110/68 | HR 77 | Ht 68.5 in | Wt 183.2 lb

## 2023-01-20 DIAGNOSIS — I471 Supraventricular tachycardia, unspecified: Secondary | ICD-10-CM | POA: Diagnosis not present

## 2023-01-20 DIAGNOSIS — I10 Essential (primary) hypertension: Secondary | ICD-10-CM

## 2023-01-20 DIAGNOSIS — Z1211 Encounter for screening for malignant neoplasm of colon: Secondary | ICD-10-CM

## 2023-01-20 DIAGNOSIS — Z803 Family history of malignant neoplasm of breast: Secondary | ICD-10-CM

## 2023-01-20 DIAGNOSIS — R002 Palpitations: Secondary | ICD-10-CM

## 2023-01-20 DIAGNOSIS — Z23 Encounter for immunization: Secondary | ICD-10-CM | POA: Diagnosis not present

## 2023-01-20 DIAGNOSIS — D7589 Other specified diseases of blood and blood-forming organs: Secondary | ICD-10-CM | POA: Diagnosis not present

## 2023-01-20 DIAGNOSIS — M722 Plantar fascial fibromatosis: Secondary | ICD-10-CM | POA: Diagnosis not present

## 2023-01-20 DIAGNOSIS — Z800 Family history of malignant neoplasm of digestive organs: Secondary | ICD-10-CM

## 2023-01-20 DIAGNOSIS — N281 Cyst of kidney, acquired: Secondary | ICD-10-CM

## 2023-01-20 DIAGNOSIS — E559 Vitamin D deficiency, unspecified: Secondary | ICD-10-CM

## 2023-01-20 DIAGNOSIS — Z0000 Encounter for general adult medical examination without abnormal findings: Secondary | ICD-10-CM | POA: Diagnosis not present

## 2023-01-20 DIAGNOSIS — Z975 Presence of (intrauterine) contraceptive device: Secondary | ICD-10-CM | POA: Diagnosis not present

## 2023-01-20 DIAGNOSIS — Z13220 Encounter for screening for lipoid disorders: Secondary | ICD-10-CM

## 2023-01-20 DIAGNOSIS — Z7185 Encounter for immunization safety counseling: Secondary | ICD-10-CM

## 2023-01-20 NOTE — Progress Notes (Signed)
Mailed her fecal tube

## 2023-01-20 NOTE — Progress Notes (Addendum)
Subjective:   HPI  Christine Gardner is a 47 y.o. female who presents for a complete physical. Chief Complaint  Patient presents with   fasting cpe    Fasting cpe, no concerns    Preventative care: [facility]optometry Dr. Joni Reining, dentist  Dr. Hale Bogus, prior with [facility], [name] Dr. Skeet Latch, cardiology Dr. Edgerton Cellar Dr. Titus Dubin, Urology Francess Mullen, Camelia Eng, PA-C here for primary care   Concerns: Per gyn referral, has appt soon for genetic counseling.  Has seen cardiology in past year, was referred to specialist for  possible ablation for prior PSVT and palpitations  Still exercising regularly, eating clean, healthy.  Having some left foot plantar fascitis problems, using stretching, cold therapy   Reviewed their medical, surgical, family, social, medication, and allergy history and updated chart as appropriate.  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; Flagyl   Constipation    Hypertension 2016   IUD (intrauterine device) in place 2019   Mirena   Migraine    Seasonal allergic rhinitis    allergy testing 06/2013 with [place]   Vitamin D deficiency     Past Surgical History:  Procedure Laterality Date   ABDOMINOPLASTY  12/2019   COLONOSCOPY  12/2015   polyp.   [place], Dr. Doristine Devoid   COLPOSCOPY  2013   LGSIL, f/u q37moas of 6/14   TOE SURGERY     hammer toe 4th, 5th, bilat    Social History   Socioeconomic History   Marital status: Married    Spouse name: Not on file   Number of children: 3   Years of education: Not on file   Highest education level: Not on file  Occupational History   Not on file  Tobacco Use   Smoking status: Never   Smokeless tobacco: Never  Vaping Use   Vaping Use: Never used  Substance and Sexual Activity   Alcohol use: Yes    Alcohol/week: 4.0 standard drinks of alcohol    Types: 4 Glasses of wine per week    Comment:  occassionally   Drug use: No   Sexual activity: Yes    Birth control/protection: I.U.D.    Comment: mVerdis Frederickson/vas   Other Topics Concern   Not on file  Social History Narrative   Divorced, remarried 08/2018.  3 adult children, 2 grandchild.  Exercise works out with trainer 2 x per week, and running group.   Some weights.  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  08/2017 opened up [employer].  As of 01/2022   Social Determinants of Health   Financial Resource Strain: Not on file  Food Insecurity: Not on file  Transportation Needs: Not on file  Physical Activity: Not on file  Stress: Not on file  Social Connections: Not on file  Intimate Partner Violence: Not on file    Family History  Problem Relation Age of Onset   Pancreatic cancer Mother 611  Hyperlipidemia Mother    Migraines Mother    Hypertension Mother    Diabetes Mother    Heart disease Father [phone]      died of MI   Other Father        quesionable drug abuse   Hypertension Sister    Hypertension Sister    Heart disease Sister        CHF due to hyperthyroid   Hyperthyroidism  Sister    Hypertension Brother    Hyperlipidemia Maternal Grandmother    Diabetes Maternal Grandmother    Cancer Maternal Grandmother 91       breast with mets   Cancer Maternal Aunt 65       breast   Cancer Maternal Aunt 65       breast   Stroke Neg Hx    Colon cancer Neg Hx      Current Outpatient Medications:    amLODipine (NORVASC) 10 MG tablet, Take 1 tablet (10 mg total) by mouth daily., Disp: 90 tablet, Rfl: 0   Cyanocobalamin (VITAMIN B 12 PO), Take 5,000 mcg by mouth daily., Disp: , Rfl:    diltiazem (CARDIZEM) 60 MG tablet, Take 1 tablet (60 mg total) by mouth every 6 (six) hours as needed., Disp: 30 tablet, Rfl: 5   diphenhydrAMINE (BENADRYL) 25 MG tablet, Take 1 tablet (25 mg total) by mouth every 6 (six) hours as needed for itching., Disp: 30 tablet, Rfl: 0   levonorgestrel (MIRENA) 20 MCG/24HR  IUD, 1 each by Intrauterine route once., Disp: , Rfl:    Multiple Vitamin (MULTIVITAMIN) tablet, Take 1 tablet by mouth daily., Disp: , Rfl:    Turmeric (QC TUMERIC COMPLEX PO), Take by mouth. Tumeric with Biopene Takes 1500 daily, Disp: , Rfl:    vitamin C (ASCORBIC ACID) 500 MG tablet, Take 500 mg by mouth daily., Disp: , Rfl:    Cholecalciferol (VITAMIN D) 125 MCG (5000 UT) CAPS, Take 1 capsule by mouth daily at 12 noon. (Patient not taking: Reported on 01/20/2023), Disp: , Rfl:   Allergies  Allergen Reactions   Flagyl [Metronidazole] Anaphylaxis   Vicodin [Hydrocodone-Acetaminophen]     itching   Hydrocodone-Acetaminophen Itching   Metoprolol     INCREASED HR    Oxycodone-Acetaminophen Itching   Percocet [Oxycodone-Acetaminophen] Itching   Tramadol Itching   Review of Systems  Constitutional:  Negative for chills, fever, malaise/fatigue and weight loss.  HENT:  Negative for congestion, ear pain, hearing loss, sore throat and tinnitus.   Eyes:  Negative for blurred vision, pain and redness.  Respiratory:  Negative for cough, hemoptysis and shortness of breath.   Cardiovascular:  Negative for chest pain, palpitations, orthopnea, claudication and leg swelling.  Gastrointestinal:  Negative for abdominal pain, blood in stool, constipation, diarrhea, nausea and vomiting.  Genitourinary:  Negative for dysuria, flank pain, frequency, hematuria and urgency.  Musculoskeletal:  Positive for joint pain and myalgias. Negative for falls.  Skin:  Negative for itching and rash.  Neurological:  Negative for dizziness, tingling, speech change, weakness and headaches.  Endo/Heme/Allergies:  Negative for polydipsia. Does not bruise/bleed easily.  Psychiatric/Behavioral:  Negative for depression and memory loss. The patient is not nervous/anxious and does not have insomnia.        Objective:   Physical Exam  BP 110/68   Pulse 77   Ht 5' 8.5" (1.74 m)   Wt 183 lb 3.2 oz (83.1 kg)   BMI 27.45  kg/m   BP Readings from Last 3 Encounters:  01/20/23 110/68  01/06/23 125/75  05/26/22 126/81   Wt Readings from Last 3 Encounters:  01/20/23 183 lb 3.2 oz (83.1 kg)  01/06/23 184 lb (83.5 kg)  05/26/22 173 lb (78.5 kg)    General appearance: alert, no distress, WD/WN, pleasant African-American female   Skin: tattoo right lower flank/abdomen, tattoo upper back, tattoo lower half of back, no worrisome lesions   HENT/oral - limited exam given covid precautions  Neck: supple, no lymphadenopathy, no thyromegaly, no masses, normal ROM, no bruit Heart: RRR, normal S1, S2, no murmurs   Lungs: CTA bilaterally, no wheezes, rhonchi, or rales   Abdomen: +bs, soft, non tender, non distended, no masses, no hepatomegaly, no splenomegaly, no bruits   Back: non tender, normal ROM, no scoliosis   Musculoskeletal: mild tenderness left medial volar foot, bilat feet relatively flat arches, otherwise upper extremities non tender, no obvious deformity, normal ROM throughout, lower extremities non tender, no obvious deformity, normal ROM throughout   Extremities: no edema, no cyanosis, no clubbing   Pulses: 2+ symmetric, upper and lower extremities, normal cap refill   Neurological: alert, oriented x 3, CN2-12 intact, strength normal upper extremities and lower extremities, sensation normal throughout, DTRs 2+ throughout, no cerebellar signs, gait normal   Psychiatric: normal affect, behavior normal, pleasant   Breast/gyn/rectal - deferred to gynecology    Assessment and Plan :    Encounter Diagnoses  Name Primary?   Encounter for health maintenance examination in adult Yes   Need for Tdap vaccination    Essential hypertension    PSVT (paroxysmal supraventricular tachycardia)    Vitamin D deficiency    Vaccine counseling    Screening for lipid disorders    Palpitation    IUD (intrauterine device) in place    Family history of pancreatic cancer    Family history of breast cancer    Plantar  fasciitis    Screening for colon cancer    Renal cyst      Today you had a preventative care visit or wellness visit.    Topics today may have included healthy lifestyle, diet, exercise, preventative care, vaccinations, sick and well care, proper use of emergency dept and after hours care, as well as other concerns.     Recommendations: Continue to return yearly for your annual wellness and preventative care visits.  This gives [place] a chance to discuss healthy lifestyle, exercise, vaccinations, review your chart record, and perform screenings where appropriate.  I recommend you see your eye doctor yearly for routine vision care.  I recommend you see your dentist yearly for routine dental care including hygiene visits twice yearly.   Vaccination recommendations were reviewed Immunization History  Administered Date(s) Administered   Influenza-Unspecified 09/05/2021, 09/19/2022   PFIZER(Purple Top)SARS-COV-2 Vaccination 02/09/2020, 03/01/2020, 10/04/2020   Tdap 05/29/2013, 01/20/2023   Counseled on the Tdap (tetanus, diptheria, and acellular pertussis) vaccine.  Vaccine information sheet given. Tdap vaccine given after consent obtained.    Screening for cancer: Breast cancer screening: You should perform a self breast exam monthly.   We reviewed recommendations for regular mammograms and breast cancer screening.  Colon cancer screening:  I reviewed your colonoscopy on file that is up to date from 2017 Discussed screening recommendations Advised updated fecal hemoccult screening.  We will contact her when we have these in office soon.  Cervical cancer screening: We reviewed recommendations for pap smear screening. She is seeing a new gynecologist soon  Skin cancer screening: Check your skin regularly for new changes, growing lesions, or other lesions of concern Come in for evaluation if you have skin lesions of concern.  Lung cancer screening: If you have a greater than 30  pack year history of tobacco use, then you qualify for lung cancer screening with a chest CT scan  We currently don't have screenings for other cancers besides breast, cervical, colon, and lung cancers.  If you have a strong family history of cancer or  have other cancer screening concerns, please let me know.    Bone health: Get at least 150 minutes of aerobic exercise weekly Get weight bearing exercise at least once weekly   Heart health: Get at least 150 minutes of aerobic exercise weekly Limit alcohol It is important to maintain a healthy blood pressure and healthy cholesterol numbers   Separate significant issues discussed: Plantar fascitis  Every morning try doing a tennis ball massage with feet on the floor and towel stretch behind the ball of the foot to stretch the plantar fascia Order plantar fascitis night time 90 degree splints online, through [employer] for example.   They are typically about $25 each Avoid going barefoot since your feet flatten out without good arch support I recommend you use arch support shoe inserts.   This will help support the plantar fascia and arches You can use over the counter pain reliever for worse pain F/u in a month  Hypertension-controlled on current medication, Amlodipine 36m daily.  History of palpitations, on diltiazem prn.    Vitamin D deficiency-uses occasional supplement, not currently on supplement, lab today  Renal cyst-I looked over the July 2023 urology notes.  At that time she had gross hematuria that resolved and was thought to be related to a kidney stone that may have passed.  She also has some benign-appearing kidney cyst.  She has a follow-up ultrasound and follow-up due at this time with urology  Brileigh was seen today for fasting cpe.  Diagnoses and all orders for this visit:  Encounter for health maintenance examination in adult -     Comprehensive metabolic panel -     CBC -     VITAMIN D 25 Hydroxy (Vit-D Deficiency,  Fractures) -     Lipid panel -     TSH  Need for Tdap vaccination -     Tdap vaccine greater than or equal to 7yo IM  Essential hypertension  PSVT (paroxysmal supraventricular tachycardia)  Vitamin D deficiency -     VITAMIN D 25 Hydroxy (Vit-D Deficiency, Fractures)  Vaccine counseling  Screening for lipid disorders -     Lipid panel  Palpitation  IUD (intrauterine device) in place  Family history of pancreatic cancer  Family history of breast cancer  Plantar fasciitis  Screening for colon cancer -     Fecal occult blood, imunochemical  Renal cyst    Follow-up pending labs, yearly for physical

## 2023-01-21 ENCOUNTER — Other Ambulatory Visit: Payer: Self-pay | Admitting: Medical

## 2023-01-21 LAB — COMPREHENSIVE METABOLIC PANEL
ALT: 14 IU/L (ref 0–32)
AST: 14 IU/L (ref 0–40)
Albumin/Globulin Ratio: 1.6 (ref 1.2–2.2)
Albumin: 4.8 g/dL (ref 3.9–4.9)
Alkaline Phosphatase: 90 IU/L (ref 44–121)
BUN/Creatinine Ratio: 16 (ref 9–23)
BUN: 12 mg/dL (ref 6–24)
Bilirubin Total: 0.8 mg/dL (ref 0.0–1.2)
CO2: 20 mmol/L (ref 20–29)
Calcium: 9.6 mg/dL (ref 8.7–10.2)
Chloride: 104 mmol/L (ref 96–106)
Creatinine, Ser: 0.74 mg/dL (ref 0.57–1.00)
Globulin, Total: 3 g/dL (ref 1.5–4.5)
Glucose: 101 mg/dL — ABNORMAL HIGH (ref 70–99)
Potassium: 4.2 mmol/L (ref 3.5–5.2)
Sodium: 142 mmol/L (ref 134–144)
Total Protein: 7.8 g/dL (ref 6.0–8.5)
eGFR: 100 mL/min/{1.73_m2} (ref >59)

## 2023-01-21 LAB — LIPID PANEL
Chol/HDL Ratio: 3.6 ratio (ref 0.0–4.4)
Cholesterol, Total: 216 mg/dL — ABNORMAL HIGH (ref 100–199)
HDL: 60 mg/dL (ref >39)
LDL Chol Calc (NIH): 143 mg/dL — ABNORMAL HIGH (ref 0–99)
Triglycerides: 72 mg/dL (ref 0–149)
VLDL Cholesterol Cal: 13 mg/dL (ref 5–40)

## 2023-01-21 LAB — CBC
Hematocrit: 42.9 % (ref 34.0–46.6)
Hemoglobin: 13.9 g/dL (ref 11.1–15.9)
MCH: 33.1 pg — ABNORMAL HIGH (ref 26.6–33.0)
MCHC: 32.4 g/dL (ref 31.5–35.7)
MCV: 102 fL — ABNORMAL HIGH (ref 79–97)
Platelets: 286 10*3/uL (ref 150–450)
RBC: 4.2 x10E6/uL (ref 3.77–5.28)
RDW: 12.2 % (ref 11.7–15.4)
WBC: 5.9 10*3/uL (ref 3.4–10.8)

## 2023-01-21 LAB — VITAMIN D 25 HYDROXY (VIT D DEFICIENCY, FRACTURES): Vit D, 25-Hydroxy: 22.1 ng/mL — ABNORMAL LOW (ref 30.0–100.0)

## 2023-01-21 LAB — TSH: TSH: 1.67 u[IU]/mL (ref 0.450–4.500)

## 2023-01-21 MED ORDER — AMLODIPINE BESYLATE 10 MG PO TABS: 10.0000 mg | ORAL_TABLET | Freq: Every day | ORAL | 2 refills | Status: DC

## 2023-01-21 NOTE — Progress Notes (Signed)
Results sent through MyChart

## 2023-01-21 NOTE — Progress Notes (Signed)
Results sent through My Chart  See if Kilya in lab can add a folate and B12 level?  RE: macrocytosis or abnormal CBC is the reason

## 2023-01-24 ENCOUNTER — Encounter: Payer: Self-pay | Admitting: *Deleted

## 2023-01-24 LAB — B12 AND FOLATE PANEL
Folate: 6.4 ng/mL (ref >3.0)
Vitamin B-12: 685 pg/mL (ref 232–1245)

## 2023-01-24 LAB — SPECIMEN STATUS REPORT

## 2023-01-24 NOTE — Progress Notes (Signed)
Results sent through MyChart

## 2023-01-25 ENCOUNTER — Other Ambulatory Visit: Payer: Self-pay | Admitting: Medical

## 2023-01-25 DIAGNOSIS — I10 Essential (primary) hypertension: Secondary | ICD-10-CM

## 2023-01-25 DIAGNOSIS — Z136 Encounter for screening for cardiovascular disorders: Secondary | ICD-10-CM

## 2023-01-25 DIAGNOSIS — Z13220 Encounter for screening for lipoid disorders: Secondary | ICD-10-CM

## 2023-01-26 ENCOUNTER — Ambulatory Visit: Payer: Self-pay

## 2023-01-26 ENCOUNTER — Ambulatory Visit: Payer: 59 | Admitting: Sports Medicine

## 2023-01-26 VITALS — BP 124/84 | Ht 67.0 in | Wt 182.0 lb

## 2023-01-26 DIAGNOSIS — M79672 Pain in left foot: Secondary | ICD-10-CM | POA: Diagnosis not present

## 2023-01-26 DIAGNOSIS — M722 Plantar fascial fibromatosis: Secondary | ICD-10-CM

## 2023-01-26 NOTE — Assessment & Plan Note (Signed)
Patient was given handout with plantar fascia stretches to perform 2 times a day.  I also encouraged her to use frozen water bottle for ice massage.  She was given heel cups to help alleviate some of her discomfort.  We did discuss steroid injection however we agreed to hold off on this as her pain is not that severe.  She has ordered [place] sock to sleep and will trial that as well.  Follow-up if symptoms worsen or fail to improve over the next 4 to 6 weeks. For her pes planus that could be contributing to her plantar fasciitis she was given a sport insole to use in her everyday and exercise shoes.  For a possible osteoarthritis of the MTP joint recommend she continue with topical Voltaren gel up to 4 times a day.

## 2023-01-26 NOTE — Progress Notes (Addendum)
New Patient Office Visit  Subjective   Patient ID: Doresa Daria, female    DOB: [birth year]  Age: 47 y.o. MRN: [id number]  Left heel pain  Ms. Lahman is a new patient here today with chief complaint of left sided heel pain and right foot great toe pain.  She reports her left heel pain began about a month ago and has been progressively worsening especially a couple days ago.  She has ordered a night splint however it has not yet arrived.  She has tried some morning stretching however her pain persists.  She does not walk barefoot and reports she changes her tennis shoes regularly.  She denies any running however she does walk on the treadmill and use the elliptical.  She denies any acute injury to this foot.  Most of her pain is located in the heel, with some recent radiation up into her ankle.   In regards to her right foot great toe pain she reports she was walking the dog when she slipped and had forceful contraction of her great toe.  She has always had a bunion however she reports most of her pain is over her bunion.  She denies any other injury.  Her pain has been consistent for about 6 months now.  Her massage therapist is worried about her tendon.  She is able to move her toe normally and continue with her activities of daily living.   ROS as listed above in HPI    Objective:     BP 124/84   Ht 5' 7"$  (1.702 m)   Wt 182 lb (82.6 kg)   BMI 28.51 kg/m   Physical Exam Vitals reviewed.  Constitutional:      General: She is not in acute distress.    Appearance: Normal appearance. She is not ill-appearing, toxic-appearing or diaphoretic.  Pulmonary:     Effort: Pulmonary effort is normal.  Neurological:     Mental Status: She is alert.   Left foot: No obvious deformity or asymmetry.  No ecchymosis or edema.  She does have some bilateral pes planus.  Full range of motion plantarflexion, dorsiflexion, inversion and eversion.  Strength 5/5 plantarflexion, dorsiflexion, inversion and  eversion.  Tenderness to palpation at the origin of the plantar fascia.  Dorsalis pedis 2+. Right foot, no obvious deformity or asymmetry.  She does have some tenderness to palpation over the first MTP.  Full range of motion of the great toe flexion and extension.  Strength 5/5 resisted flexion and extension.  Limited ultrasound: Left foot Plantar fascia was visualized, no hypoechoic changes or disruption to plantar fascia was visualized  Right first MTP There is some slight cortical irregularity,with bony spurring dorsally, no obvious fracture.  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    Assessment & Plan:   Problem List Items Addressed This Visit       Musculoskeletal and Integument   Plantar fasciitis    Patient was given handout with plantar fascia stretches to perform 2 times a day.  I also encouraged her to use frozen water bottle for ice massage.  She was given heel cups to help alleviate some of her discomfort.  We did discuss steroid injection however we agreed to hold off on this as her pain is not that severe.  She has ordered [place] sock to sleep and will trial that as well.  Follow-up if symptoms worsen or fail to improve over the next 4 to 6 weeks. For her pes planus that could be  contributing to her plantar fasciitis she was given a sport insole to use in her everyday and exercise shoes.  For a possible osteoarthritis of the MTP joint recommend she continue with topical Voltaren gel up to 4 times a day.      Other Visit Diagnoses     Left foot pain    -  Primary   Relevant Orders   [place] LIMITED JOINT SPACE STRUCTURES LOW LEFT       Return if symptoms worsen or fail to improve.    Elmore Guise, DO  Addendum:  I was the preceptor for this visit and available for immediate consultation.  Karlton Lemon MD Kirt Boys

## 2023-01-26 NOTE — Patient Instructions (Signed)
For your left foot plantar fasciitis I recommend wearing a cushioned heel cup until your symptoms have resolved.  Perform the stretches and exercises 1-2 times a day.  I would also recommend using a frozen water bottle as an ice massage.  Reduce your time walking barefoot, Birkenstocks or slippers with some arch support would be recommended.  If you have had no improvement after 4 weeks of the stretches and using the heel cups I recommend follow-up for further intervention.  If all of her symptoms have resolved at 4 to 6 weeks you may call and cancel your follow-up appointment.  We have also fitted you for some sport insoles to help with your flat feet.  This can help to reduce the recurrence of your planter fasciitis.  I would recommend placing a thin your walking shoes and switching them out as you to your tennis shoes.  Your right foot great toe pain.  It is likely that you have exacerbated some osteoarthritis.  I would recommend using some topical Voltaren gel right over the area a couple times a day for pain as needed.

## 2023-02-02 ENCOUNTER — Telehealth: Payer: Self-pay | Admitting: Medical

## 2023-02-02 NOTE — Telephone Encounter (Signed)
CT Appointment

## 2023-02-07 ENCOUNTER — Encounter: Payer: Self-pay | Admitting: Cardiovascular Disease

## 2023-02-07 ENCOUNTER — Ambulatory Visit: Payer: 59 | Attending: Cardiovascular Disease | Admitting: Cardiovascular Disease

## 2023-02-07 VITALS — BP 134/86 | HR 72 | Ht 67.0 in | Wt 183.2 lb

## 2023-02-07 DIAGNOSIS — I471 Supraventricular tachycardia, unspecified: Secondary | ICD-10-CM

## 2023-02-07 DIAGNOSIS — R002 Palpitations: Secondary | ICD-10-CM

## 2023-02-07 NOTE — Progress Notes (Signed)
Electrophysiology Office Note:    Date:  02/07/2023   ID:  Christine Gardner, DOB September 25, 1976, MRN [id number]  PCP:  Caryl Ada   [place] Providers Cardiologist:  None Electrophysiologist:  Melida Quitter, MD     Referring MD: Carlena Hurl, PA-C   History of Present Illness:    Christine Gardner is a 47 y.o. female with a hx listed below, significant for SVT and palpitations, referred for arrhythmia management.  She wore a monitor in 2016 to evaluate palpitations that have been occurring for years.  There were multiple triggered events; these were associated with sinus rhythm.  She wore a monitor again in 2019 that detected episodes of tachycardia with long R-P and rates of up to 201 bpm. Diltiazem has not helped. She did not tolerate metoprolol.  The palpitations were particularly bad prior to her mother's death about a year ago.  This is a very stressful time for her.  Since then, she reports that she really has not experienced palpitations much at all.   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; Flagyl   Constipation    Hypertension 2016   IUD (intrauterine device) in place 2019   Mirena   Migraine    Seasonal allergic rhinitis    allergy testing 06/2013 with [place]   Vitamin D deficiency     Past Surgical History:  Procedure Laterality Date   ABDOMINOPLASTY  12/2019   COLONOSCOPY  12/2015   polyp.   [place], Dr. Doristine Devoid   COLPOSCOPY  2013   LGSIL, f/u q69moas of 6/14   TOE SURGERY     hammer toe 4th, 5th, bilat    Current Medications: Current Meds  Medication Sig   amLODipine (NORVASC) 10 MG tablet Take 1 tablet (10 mg total) by mouth daily.   Cholecalciferol (VITAMIN D) 125 MCG (5000 UT) CAPS Take 1 capsule by mouth daily at 12 noon.   Cyanocobalamin (VITAMIN B 12 PO) Take 5,000 mcg by mouth daily.   diltiazem (CARDIZEM) 60 MG tablet Take 1 tablet (60 mg total) by mouth every 6 (six)  hours as needed.   diphenhydrAMINE (BENADRYL) 25 MG tablet Take 1 tablet (25 mg total) by mouth every 6 (six) hours as needed for itching.   levonorgestrel (MIRENA) 20 MCG/24HR IUD 1 each by Intrauterine route once.   Multiple Vitamin (MULTIVITAMIN) tablet Take 1 tablet by mouth daily.   Turmeric (QC TUMERIC COMPLEX PO) Take by mouth. Tumeric with Biopene Takes 1500 daily   vitamin C (ASCORBIC ACID) 500 MG tablet Take 500 mg by mouth daily.     Allergies:   Flagyl [metronidazole], Vicodin [hydrocodone-acetaminophen], Hydrocodone-acetaminophen, Metoprolol, Oxycodone-acetaminophen, Percocet [oxycodone-acetaminophen], and Tramadol   Social and Family History: Reviewed in Epic  ROS:   Please see the history of present illness.    All other systems reviewed and are negative.  EKGs/Labs/Other Studies Reviewed Today:    Cardiac Studies & Procedures         MONITORS  CARDIAC EVENT MONITOR 03/20/2019  Narrative 30 Day Event Monitor  Quality: Fair.  Baseline artifact. Predominant rhythm: sinus rhythm Average heart rate: 90 bpm Max heart rate: 201 bpm Min heart rate: 55 bpm Pauses >2.5 seconds: none  Patient reported palpitations at which time sinus rhythm was noted. Episodes of SVT were noted up to 201 bpm.  No patient triggers at this time. Sinus arrhythmia noted.  Tiffany C. ROval Linsey MD, FUniverity Of Md [facility] Center4/14/2020 10:30  AM           EKG:  Last EKG results: today - sinus rhythm   Recent Labs: 01/20/2023: ALT 14; BUN 12; Creatinine, Ser 0.74; Hemoglobin 13.9; Platelets 286; Potassium 4.2; Sodium 142; TSH 1.670     Physical Exam:    VS:  BP 134/86   Pulse 72   Ht '5\' 7"'$  (1.702 m)   Wt 183 lb 3.2 oz (83.1 kg)   SpO2 99%   BMI 28.69 kg/m     Wt Readings from Last 3 Encounters:  02/07/23 183 lb 3.2 oz (83.1 kg)  01/26/23 182 lb (82.6 kg)  01/20/23 183 lb 3.2 oz (83.1 kg)     GEN: Well nourished, well developed in no acute distress CARDIAC: RRR, no murmurs, rubs,  gallops RESPIRATORY:  Normal work of breathing MUSCULOSKELETAL: no edema    ASSESSMENT & PLAN:    Tachycardia: Long R-P. Likely AT vs sinus tachycardia. Unfortunately, we do not have onset/offset which would help classify whether the rhythm is SVT or sinus. At present, she is not having any symptoms -- it appears that the palpitations have resolved. She may continue to take diltiazem as needed. I don't see a strong indication for additional monitoring or EP study.        Medication Adjustments/Labs and Tests Ordered: Current medicines are reviewed at length with the patient today.  Concerns regarding medicines are outlined above.  Orders Placed This Encounter  Procedures   EKG 12-Lead   No orders of the defined types were placed in this encounter.    Signed, Melida Quitter, MD  02/07/2023 9:03 AM    [place]

## 2023-02-07 NOTE — Patient Instructions (Signed)
Medication Instructions:  Your physician recommends that you continue on your current medications as directed. Please refer to the Current Medication list given to you today.  *If you need a refill on your cardiac medications before your next appointment, please call your pharmacy*   Lab Work: None ordered If you have labs (blood work) drawn today and your tests are completely normal, you will receive your results only by: [place] (if you have MyChart) OR A paper copy in the mail If you have any lab test that is abnormal or we need to change your treatment, we will call you to review the results.   Testing/Procedures: None ordered  Follow-Up: Keep follow-up with Dr. Skeet Latch as scheduled.

## 2023-02-09 ENCOUNTER — Ambulatory Visit (HOSPITAL_COMMUNITY)
Admission: RE | Admit: 2023-02-09 | Discharge: 2023-02-09 | Disposition: A | Discharge status: Home / Self Care | Payer: 59 | Source: Ambulatory Visit | Attending: Medical | Admitting: Medical

## 2023-02-09 DIAGNOSIS — Z136 Encounter for screening for cardiovascular disorders: Secondary | ICD-10-CM | POA: Insufficient documentation

## 2023-02-09 DIAGNOSIS — F4323 Adjustment disorder with mixed anxiety and depressed mood: Secondary | ICD-10-CM | POA: Diagnosis not present

## 2023-02-09 DIAGNOSIS — Z13220 Encounter for screening for lipoid disorders: Secondary | ICD-10-CM | POA: Insufficient documentation

## 2023-02-09 DIAGNOSIS — I10 Essential (primary) hypertension: Secondary | ICD-10-CM | POA: Insufficient documentation

## 2023-02-11 NOTE — Progress Notes (Signed)
Results sent through MyChart

## 2023-02-22 DIAGNOSIS — Z1211 Encounter for screening for malignant neoplasm of colon: Secondary | ICD-10-CM | POA: Diagnosis not present

## 2023-02-23 DIAGNOSIS — F4323 Adjustment disorder with mixed anxiety and depressed mood: Secondary | ICD-10-CM | POA: Diagnosis not present

## 2023-02-28 ENCOUNTER — Encounter: Payer: Self-pay | Admitting: Genetic Counselor

## 2023-03-01 DIAGNOSIS — Z789 Other specified health status: Secondary | ICD-10-CM | POA: Diagnosis not present

## 2023-03-01 DIAGNOSIS — L989 Disorder of the skin and subcutaneous tissue, unspecified: Secondary | ICD-10-CM | POA: Diagnosis not present

## 2023-03-01 DIAGNOSIS — L089 Local infection of the skin and subcutaneous tissue, unspecified: Secondary | ICD-10-CM | POA: Diagnosis not present

## 2023-03-01 DIAGNOSIS — I10 Essential (primary) hypertension: Secondary | ICD-10-CM | POA: Diagnosis not present

## 2023-03-01 DIAGNOSIS — S31105A Unspecified open wound of abdominal wall, periumbilic region without penetration into peritoneal cavity, initial encounter: Secondary | ICD-10-CM | POA: Diagnosis not present

## 2023-03-01 LAB — FECAL OCCULT BLOOD, IMMUNOCHEMICAL: Fecal Occult Bld: NEGATIVE

## 2023-03-02 ENCOUNTER — Inpatient Hospital Stay: Payer: 59

## 2023-03-02 ENCOUNTER — Other Ambulatory Visit: Payer: Self-pay | Admitting: Genetic Counselor

## 2023-03-02 ENCOUNTER — Inpatient Hospital Stay: Payer: 59 | Attending: Genetic Counselor | Admitting: Genetic Counselor

## 2023-03-02 DIAGNOSIS — Z800 Family history of malignant neoplasm of digestive organs: Secondary | ICD-10-CM

## 2023-03-02 DIAGNOSIS — Z803 Family history of malignant neoplasm of breast: Secondary | ICD-10-CM

## 2023-03-02 LAB — GENETIC SCREENING ORDER

## 2023-03-03 ENCOUNTER — Encounter: Payer: Self-pay | Admitting: Genetic Counselor

## 2023-03-03 NOTE — Progress Notes (Addendum)
REFERRING PROVIDER: Megan Salon, MD [address],  [place] [address]  PRIMARY PROVIDER:  Carlena Hurl, PA-C  PRIMARY REASON FOR VISIT:  1. Family history of pancreatic cancer   2. Family history of breast cancer      HISTORY OF PRESENT ILLNESS:   Ms. Sinkler, a 47 y.o. female, was seen for a [place] cancer genetics consultation at the request of Dr. Sabra Heck due to a family history of pancreatic and breast cancer.  Ms. Robling presents to clinic today to discuss the possibility of a hereditary predisposition to cancer, genetic testing, and to further clarify her future cancer risks, as well as potential cancer risks for family members.   Ms. Dudney is a 47 y.o. female with no personal history of cancer.  The patient's mother had genetic testing in 2022 that showed a VUS in ATM and MSH3.  CANCER HISTORY:  Oncology History   No history exists.     RISK FACTORS:  Menarche was at age 58.  First live birth at age 63.  OCP use for approximately  < 10  years.  Ovaries intact: yes.  Hysterectomy: no.  Menopausal status: perimenopausal.  HRT use: 0 years. Colonoscopy: yes; normal. Mammogram within the last year: yes. Number of breast biopsies: 0. Up to date with pelvic exams: yes. Any excessive radiation exposure in the past: no  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; Flagyl   Constipation    Family history of breast cancer    Family history of pancreatic cancer    Hypertension 2016   IUD (intrauterine device) in place 2019   Mirena   Migraine    Seasonal allergic rhinitis    allergy testing 06/2013 with [facility]   Vitamin D deficiency     Past Surgical History:  Procedure Laterality Date   ABDOMINOPLASTY  12/2019   COLONOSCOPY  12/2015   polyp.   [facility], Dr. Doristine Devoid   COLPOSCOPY  2013   LGSIL, f/u q17mo as of 6/14   TOE SURGERY     hammer toe 4th, 5th, bilat    Social History    Socioeconomic History   Marital status: Married    Spouse name: Not on file   Number of children: 3   Years of education: Not on file   Highest education level: Not on file  Occupational History   Not on file  Tobacco Use   Smoking status: Never   Smokeless tobacco: Never  Vaping Use   Vaping Use: Never used  Substance and Sexual Activity   Alcohol use: Yes    Alcohol/week: 4.0 standard drinks of alcohol    Types: 4 Glasses of wine per week    Comment: occassionally   Drug use: No   Sexual activity: Yes    Birth control/protection: I.U.D.    Comment: Verdis Frederickson /vas   Other Topics Concern   Not on file  Social History Narrative   Divorced, remarried 08/2018.  3 adult children, 2 grandchild.  Exercise works out with trainer 2 x per week, and running group.   Some weights.  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  08/2017 opened up [employer].   Christian.  As of 01/2022   Social Determinants of Health   Financial Resource Strain: Not on file  Food Insecurity: Not on file  Transportation Needs: Not on file  Physical Activity: Not on file  Stress:  Not on file  Social Connections: Not on file     FAMILY HISTORY:  We obtained a detailed, 4-generation family history.  Significant diagnoses are listed below: Family History  Problem Relation Age of Onset   Pancreatic cancer Mother 27   Hyperlipidemia Mother    Migraines Mother    Hypertension Mother    Diabetes Mother    Heart disease Father 49       died of MI   Other Father        quesionable drug abuse   Hypertension Sister    Hypertension Sister    Heart disease Sister        CHF due to hyperthyroid   Hyperthyroidism Sister    Hypertension Brother    Hyperlipidemia Maternal Grandmother    Diabetes Maternal Grandmother    Breast cancer Maternal Grandmother 91       breast with mets   Cancer Maternal Aunt 65       breast   Cancer Maternal Aunt 65       breast   Stroke Neg Hx    Colon cancer  Neg Hx      The patient has a son and two daughters who are cancer free.  She has two brothers and two sisters who are cancer free.  Both parents are deceased.  The patient's father died.  There is no additional information about his side of the family.  The patient's mother was diagnosed with pancreatic cancer at 45.  She underwent genetic testing and was found to have a VUS in ATM and MSH3. She has six sisters and a brother.  Two sisters were diagnosed with breast cancer.  The maternal grandmother also had cancer.  The patient was not aware of the type of cancer, but the patient's chart says it was breast cancer.  Ms. Doughtie is aware of previous family history of genetic testing for hereditary cancer risks. Patient's maternal ancestors are of African American descent, and paternal ancestors are of African American descent. There is no reported Ashkenazi Jewish ancestry. There is no known consanguinity.  GENETIC COUNSELING ASSESSMENT: Ms. Kauk is a 47 y.o. female with a family history of cancer which is somewhat suggestive of a hereditary cancer syndrome and predisposition to cancer given the combination of cancer in the family. We, therefore, discussed and recommended the following at today's visit.   DISCUSSION: We discussed that, in general, most cancer is not inherited in families, but instead is sporadic or familial. Sporadic cancers occur by chance and typically happen at older ages (>50 years) as this type of cancer is caused by genetic changes acquired during an individual's lifetime. Some families have more cancers than would be expected by chance; however, the ages or types of cancer are not consistent with a known genetic mutation or known genetic mutations have been ruled out. This type of familial cancer is thought to be due to a combination of multiple genetic, environmental, hormonal, and lifestyle factors. While this combination of factors likely increases the risk of cancer, the exact  source of this risk is not currently identifiable or testable.  We discussed that 5 - 10% of breast cancer and up to 15% of pancreatic cancer is hereditary, with most cases associated with BRCA mutations.  There are other genes that can be associated with hereditary breast and pancreatic cancer syndromes.  These include ATM, PALB2 and Lynch syndrome genes.  We discussed that testing is beneficial for several reasons including knowing how to  follow individuals after completing their treatment, identifying whether potential treatment options such as PARP inhibitors would be beneficial, and understand if other family members could be at risk for cancer and allow them to undergo genetic testing.   We reviewed the characteristics, features and inheritance patterns of hereditary cancer syndromes. We also discussed genetic testing, including the appropriate family members to test, the process of testing, insurance coverage and turn-around-time for results. We discussed the implications of a negative, positive, carrier and/or variant of uncertain significant result. Ms. Balay  was offered a common hereditary cancer panel (47 genes) and an expanded pan-cancer panel (77 genes). Ms. Bommer was informed of the benefits and limitations of each panel, including that expanded pan-cancer panels contain genes that do not have clear management guidelines at this point in time.  We also discussed that as the number of genes included on a panel increases, the chances of variants of uncertain significance increases. Ms. Petruska decided to pursue genetic testing for the CancerNext-Expanded+RNAinsight gene panel.   The CancerNext-Expanded gene panel offered by [facility] and includes sequencing and rearrangement analysis for the following 77 genes: AIP, ALK, APC*, ATM*, AXIN2, BAP1, BARD1, BMPR1A, BRCA1*, BRCA2*, BRIP1*, CDC73, CDH1*, CDK4, CDKN1B, CDKN2A, CHEK2*, CTNNA1, DICER1, FH, FLCN, KIF1B, LZTR1, MAX, MEN1, MET, MLH1*,  MSH2*, MSH3, MSH6*, MUTYH*, NF1*, NF2, NTHL1, PALB2*, PHOX2B, PMS2*, POT1, PRKAR1A, PTCH1, PTEN*, RAD51C*, RAD51D*, RB1, RET, SDHA, SDHAF2, SDHB, SDHC, SDHD, SMAD4, SMARCA4, SMARCB1, SMARCE1, STK11, SUFU, TMEM127, TP53*, TSC1, TSC2, and VHL (sequencing and deletion/duplication); EGFR, EGLN1, HOXB13, KIT, MITF, PDGFRA, POLD1, and POLE (sequencing only); EPCAM and GREM1 (deletion/duplication only). DNA and RNA analyses performed for * genes.   Based on Ms. Jaffee's family history of cancer, she meets medical criteria for genetic testing. Despite that she meets criteria, she may still have an out of pocket cost. We discussed that if her out of pocket cost for testing is over $100, the laboratory will call and confirm whether she wants to proceed with testing.  If the out of pocket cost of testing is less than $100 she will be billed by the genetic testing laboratory.   We discussed that some people do not want to undergo genetic testing due to fear of genetic discrimination.  The Genetic Information Nondiscrimination Act (GINA) was signed into federal law in 2008. GINA prohibits health insurers and most employers from discriminating against individuals based on genetic information (including the results of genetic tests and family history information). According to GINA, health insurance companies cannot consider genetic information to be a preexisting condition, nor can they use it to make decisions regarding coverage or rates. GINA also makes it illegal for most employers to use genetic information in making decisions about hiring, firing, promotion, or terms of employment. It is important to note that GINA does not offer protections for life insurance, disability insurance, or long-term care insurance. GINA does not apply to those in the [employer], those who work for companies with less than 15 employees, and new life insurance or long-term disability insurance policies.  Health status due to a cancer diagnosis is  not protected under GINA. More information about GINA can be found by visiting [email or web address].   Based on the patient's family history, a statistical model (Tyrer Cusik) was used to estimate her risk of developing breast cancer. This estimates her lifetime risk of developing breast cancer to be approximately 10.5%. This estimation does not consider any genetic testing results.  The patient's lifetime breast cancer risk is a preliminary  estimate based on available information using one of several models endorsed by the American Cancer Society (ACS). The ACS recommends consideration of breast MRI screening as an adjunct to mammography for patients at high risk (defined as 20% or greater lifetime risk). Please note that a woman's breast cancer risk changes over time. It may increase or decrease based on age and any changes to the personal and/or family medical history. The risks and recommendations listed above apply to this patient at this point in time. In the future, she may or may not be eligible for the same medical management strategies and, in some cases, other medical management strategies may become available to her. If she is interested in an updated breast cancer risk assessment at a later date, she can contact us.   PLAN: Due to her mother having negative genetic testing (with the exception of a couple VUS's), Ms. Omeara declined genetic testing.  We understand this, as her mother was not found to have a pathogenic variant.  We are open to pursue genetic testing in the future should additional information come to light.  We recommend that Ms. Popiel discuss appropriate population cancer screening.  Lastly, we encouraged Ms. Salvador to remain in contact with cancer genetics annually so that we can continuously update the family history and inform her of any changes in cancer genetics and testing that may be of benefit for this family.   Ms. Pesnell questions were answered to her satisfaction today.  Our contact information was provided should additional questions or concerns arise. Thank you for the referral and allowing [place] to share in the care of your patient.   Joseline Mccampbell P. Lowell Guitar, MS, [facility] Licensed, Patent attorney [email or web address] phone: [phone]  The patient was seen for a total of 35 minutes in face-to-face genetic counseling.  The patient was seen alone.  Drs. Meliton Rattan, and/or Hugo were available for questions, if needed..    _______________________________________________________________________ For Office Staff:  Number of people involved in session: 1 Was an Intern/ student involved with case: no

## 2023-03-09 DIAGNOSIS — F4323 Adjustment disorder with mixed anxiety and depressed mood: Secondary | ICD-10-CM | POA: Diagnosis not present

## 2023-03-23 ENCOUNTER — Ambulatory Visit: Payer: 59 | Admitting: Family Medicine

## 2023-03-23 ENCOUNTER — Encounter: Payer: Self-pay | Admitting: Family Medicine

## 2023-03-23 VITALS — BP 132/76 | HR 112 | Temp 98.3°F | Resp 14 | Wt 182.6 lb

## 2023-03-23 DIAGNOSIS — L989 Disorder of the skin and subcutaneous tissue, unspecified: Secondary | ICD-10-CM

## 2023-03-23 DIAGNOSIS — Z789 Other specified health status: Secondary | ICD-10-CM

## 2023-03-23 DIAGNOSIS — F4323 Adjustment disorder with mixed anxiety and depressed mood: Secondary | ICD-10-CM | POA: Diagnosis not present

## 2023-03-23 NOTE — Progress Notes (Signed)
   Subjective:    Patient ID: Christine Gardner, female    DOB: Jan 25, 1976, 47 y.o.   MRN: [id number]  HPI Bellybutton piercing about a year ago but recently has had some difficulty with some slight drainage.  She was seen in an urgent care and given topical medicines as well as Cipro.  She states it is still some difficulty.  She also has a lesion on the left cheek that is hypopigmented that she has concerns over.  She does have an appointment with dermatology set up.   Review of Systems     Objective:   Physical Exam Exam of the left cheek does show a 1 cm slightly raised pigmented lesion Exam of the umbilical area does show the piercing present but there is no drainage the erythema or tenderness.       Assessment & Plan:  Facial lesion  Body piercing She will see dermatology for the facial lesion.  Recommend she take out the lines and leave it out for several weeks.  Explained that she might have to repierce that area but I recommend avoiding any further piercings.

## 2023-03-24 NOTE — Progress Notes (Addendum)
Christine Gardner D.[facility] [address] [place] [address] Phone: [phone]   Assessment and Plan:     1. Chronic pain of left ankle 2. Left foot pain 3. Pes planus of both feet -Chronic with exacerbation, initial sports medicine visit - Continued left foot and ankle pain that has been ongoing for over a year but worsening over the past several months.  Pain overall has changed from more consistent with plantar fasciitis at first, to now being more along posterior and medial calcaneus, more consistent with an Achilles tendinitis and lateral ankle pain likely from compensation with bilateral pes planus - Recommend wearing supportive inserts with arch support and comfortable tennis shoes.  May use feet [employer] or other similar store for recommendations - Start meloxicam 15 mg daily x2 weeks.  If still having pain after 2 weeks, complete 3rd-week of meloxicam. May use remaining meloxicam as needed once daily for pain control.  Do not to use additional NSAIDs while taking meloxicam.  May use Tylenol [phone] mg 2 to 3 times a day for breakthrough pain. -X-ray obtained in clinic.  My interpretation: No acute fracture or dislocation.  Calcaneal heel spur at the plantar fascial origin.  Calcaneal heel spur mildly at Achilles tendon insertion at the most posterior aspect.  medial malleoli heel spurs. - Start HEP and physical therapy for foot and ankle  Other orders - meloxicam (MOBIC) 15 MG tablet; Take 1 tablet (15 mg total) by mouth daily.    Pertinent previous records reviewed include none   Follow Up: 4 weeks for reevaluation.  If no improvement or worsening of symptoms, could consider advanced imaging versus ultrasound versus ECSWT versus CSI   Subjective:   I, Christine Gardner, am serving as a Neurosurgeon for Doctor Richardean Sale  Chief Complaint: left foot pain   HPI:   03/25/2023 Patient is a 47 year old female complaining of left foot pain.  Patient states left sided heel pain and right foot great toe pain.  She reports her left heel pain began several months ago and has been progressively worsening especially a couple days ago.  She has ordered a night splint however it has not yet arrived.  She has tried some morning stretching however her pain persists.  She does not walk barefoot and reports she changes her tennis shoes regularly.  She denies any running however she does walk on the treadmill and use the elliptical.  She denies any acute injury to this foot.  Most of her pain is located in the heel, with some recent radiation up into her ankle.    She was given heel cup, inserts and has been icing , she is TTP ankle and foot , when she stretches she feels a tightness or a pull up the calf, ibu for the pain and that doesn't really work, no MOI, no numbness or tingling, she will get pain driving , constant pain   Relevant Historical Information: HTN  Additional pertinent review of systems negative.   Current Outpatient Medications:    meloxicam (MOBIC) 15 MG tablet, Take 1 tablet (15 mg total) by mouth daily., Disp: 30 tablet, Rfl: 0   amLODipine (NORVASC) 10 MG tablet, Take 1 tablet (10 mg total) by mouth daily., Disp: 90 tablet, Rfl: 2   Cholecalciferol (VITAMIN D) 125 MCG (5000 UT) CAPS, Take 1 capsule by mouth daily at 12 noon., Disp: , Rfl:    Cyanocobalamin (VITAMIN B 12 PO), Take 5,000 mcg  by mouth daily., Disp: , Rfl:    diltiazem (CARDIZEM) 60 MG tablet, Take 1 tablet (60 mg total) by mouth every 6 (six) hours as needed., Disp: 30 tablet, Rfl: 5   diphenhydrAMINE (BENADRYL) 25 MG tablet, Take 1 tablet (25 mg total) by mouth every 6 (six) hours as needed for itching., Disp: 30 tablet, Rfl: 0   levonorgestrel (MIRENA) 20 MCG/24HR IUD, 1 each by Intrauterine route once., Disp: , Rfl:    Multiple Vitamin (MULTIVITAMIN) tablet, Take 1 tablet by mouth daily., Disp: , Rfl:    mupirocin ointment (BACTROBAN) 2 %, APPLY TO AFFECTED  AREA 3 TIMES A DAY FOR 7 DAYS, Disp: , Rfl:    Turmeric (QC TUMERIC COMPLEX PO), Take by mouth. Tumeric with Biopene Takes 1500 daily, Disp: , Rfl:    vitamin C (ASCORBIC ACID) 500 MG tablet, Take 500 mg by mouth daily., Disp: , Rfl:    Objective:     Vitals:   03/25/23 0849  BP: 118/78  Pulse: 95  SpO2: 96%  Weight: 180 lb (81.6 kg)  Height:  (1.702 m)      Body mass index is 28.19 kg/m.    Physical Exam:    Gen: Appears well, nad, nontoxic and pleasant Psych: Alert and oriented, appropriate mood and affect Neuro: sensation intact, strength is 5/5 with df/pf/inv/ev, muscle tone wnl Skin: no susupicious lesions or rashes  Left foot/ankle:  Bilateral pes planus, otherwise no deformity, no swelling or effusion TTP mildly to posterior medial calcaneus NTTP over plantar fascia, fibular head, lat mal, medial mal, achilles, navicular, base of 5th, ATFL, CFL, deltoid, calcaneous or midfoot ROM DF 30, PF 45, inv/ev intact Negative ant drawer, talar tilt, rotation test, squeeze test. Neg thompson No pain with resisted inversion or eversion    Electronically signed by:  Christine Gardner D.[facility] 9:54 AM 03/25/23

## 2023-03-25 ENCOUNTER — Ambulatory Visit (INDEPENDENT_AMBULATORY_CARE_PROVIDER_SITE_OTHER): Payer: 59

## 2023-03-25 ENCOUNTER — Ambulatory Visit: Payer: 59 | Admitting: Sports Medicine

## 2023-03-25 VITALS — BP 118/78 | HR 95 | Ht 67.0 in | Wt 180.0 lb

## 2023-03-25 DIAGNOSIS — M25572 Pain in left ankle and joints of left foot: Secondary | ICD-10-CM

## 2023-03-25 DIAGNOSIS — M7732 Calcaneal spur, left foot: Secondary | ICD-10-CM | POA: Diagnosis not present

## 2023-03-25 DIAGNOSIS — M2141 Flat foot [pes planus] (acquired), right foot: Secondary | ICD-10-CM

## 2023-03-25 DIAGNOSIS — M79672 Pain in left foot: Secondary | ICD-10-CM | POA: Diagnosis not present

## 2023-03-25 DIAGNOSIS — G8929 Other chronic pain: Secondary | ICD-10-CM

## 2023-03-25 DIAGNOSIS — M2142 Flat foot [pes planus] (acquired), left foot: Secondary | ICD-10-CM | POA: Diagnosis not present

## 2023-03-25 MED ORDER — MELOXICAM 15 MG PO TABS: 15.0000 mg | ORAL_TABLET | Freq: Every day | ORAL | 0 refills | Status: DC

## 2023-03-25 NOTE — Patient Instructions (Addendum)
Good to see you  Ankle HEP  - Start meloxicam 15 mg daily x2 weeks.  If still having pain after 2 weeks, complete 3rd-week of meloxicam. May use remaining meloxicam as needed once daily for pain control.  Do not to use additional NSAIDs while taking meloxicam.  May use Tylenol [phone] mg 2 to 3 times a day for breakthrough pain. Recommend going to [employer] or other similar stores to try inserts  PT referral  Recommend low impact exercises , elliptical, stationary bike and water aerobics 3-4 week follow up

## 2023-04-14 ENCOUNTER — Ambulatory Visit: Payer: 59

## 2023-04-18 ENCOUNTER — Ambulatory Visit: Payer: 59 | Admitting: Sports Medicine

## 2023-04-18 NOTE — Progress Notes (Unsigned)
    Christine Gardner D.[facility] [address] [place] [address] Phone: [phone]   Assessment and Plan:     There are no diagnoses linked to this encounter.  ***   Pertinent previous records reviewed include ***   Follow Up: ***     Subjective:   I, Christine Gardner, am serving as a Neurosurgeon for Doctor Richardean Sale   Chief Complaint: left foot pain    HPI:    03/25/2023 Patient is a 47 year old female complaining of left foot pain. Patient states left sided heel pain and right foot great toe pain.  She reports her left heel pain began several months ago and has been progressively worsening especially a couple days ago.  She has ordered a night splint however it has not yet arrived.  She has tried some morning stretching however her pain persists.  She does not walk barefoot and reports she changes her tennis shoes regularly.  She denies any running however she does walk on the treadmill and use the elliptical.  She denies any acute injury to this foot.  Most of her pain is located in the heel, with some recent radiation up into her ankle.     She was given heel cup, inserts and has been icing , she is TTP ankle and foot , when she stretches she feels a tightness or a pull up the calf, ibu for the pain and that doesn't really work, no MOI, no numbness or tingling, she will get pain driving , constant pain   04/19/2023 Patient states     Relevant Historical Information: HTN  Additional pertinent review of systems negative.   Current Outpatient Medications:    amLODipine (NORVASC) 10 MG tablet, Take 1 tablet (10 mg total) by mouth daily., Disp: 90 tablet, Rfl: 2   Cholecalciferol (VITAMIN D) 125 MCG (5000 UT) CAPS, Take 1 capsule by mouth daily at 12 noon., Disp: , Rfl:    Cyanocobalamin (VITAMIN B 12 PO), Take 5,000 mcg by mouth daily., Disp: , Rfl:    diltiazem (CARDIZEM) 60 MG tablet, Take 1 tablet (60 mg total) by mouth every 6 (six)  hours as needed., Disp: 30 tablet, Rfl: 5   diphenhydrAMINE (BENADRYL) 25 MG tablet, Take 1 tablet (25 mg total) by mouth every 6 (six) hours as needed for itching., Disp: 30 tablet, Rfl: 0   levonorgestrel (MIRENA) 20 MCG/24HR IUD, 1 each by Intrauterine route once., Disp: , Rfl:    meloxicam (MOBIC) 15 MG tablet, Take 1 tablet (15 mg total) by mouth daily., Disp: 30 tablet, Rfl: 0   Multiple Vitamin (MULTIVITAMIN) tablet, Take 1 tablet by mouth daily., Disp: , Rfl:    mupirocin ointment (BACTROBAN) 2 %, APPLY TO AFFECTED AREA 3 TIMES A DAY FOR 7 DAYS, Disp: , Rfl:    Turmeric (QC TUMERIC COMPLEX PO), Take by mouth. Tumeric with Biopene Takes 1500 daily, Disp: , Rfl:    vitamin C (ASCORBIC ACID) 500 MG tablet, Take 500 mg by mouth daily., Disp: , Rfl:    Objective:     There were no vitals filed for this visit.    There is no height or weight on file to calculate BMI.    Physical Exam:    ***   Electronically signed by:  Christine Gardner D.[facility] 7:24 AM 04/18/23

## 2023-04-19 ENCOUNTER — Ambulatory Visit: Payer: 59 | Admitting: Sports Medicine

## 2023-04-19 VITALS — HR 84 | Ht 67.0 in | Wt 181.0 lb

## 2023-04-19 DIAGNOSIS — G8929 Other chronic pain: Secondary | ICD-10-CM | POA: Diagnosis not present

## 2023-04-19 DIAGNOSIS — M25572 Pain in left ankle and joints of left foot: Secondary | ICD-10-CM

## 2023-04-19 DIAGNOSIS — M722 Plantar fascial fibromatosis: Secondary | ICD-10-CM | POA: Diagnosis not present

## 2023-04-19 DIAGNOSIS — M79672 Pain in left foot: Secondary | ICD-10-CM

## 2023-04-19 DIAGNOSIS — M2142 Flat foot [pes planus] (acquired), left foot: Secondary | ICD-10-CM | POA: Diagnosis not present

## 2023-04-19 DIAGNOSIS — M2141 Flat foot [pes planus] (acquired), right foot: Secondary | ICD-10-CM

## 2023-04-19 NOTE — Patient Instructions (Addendum)
Good to see you Complete course of meloxicam  Continue using heel cups and comfortable foot wear Start PT Foot HEP As needed follow up

## 2023-04-24 ENCOUNTER — Other Ambulatory Visit: Payer: Self-pay | Admitting: Sports Medicine

## 2023-05-04 ENCOUNTER — Encounter: Payer: Self-pay | Admitting: Dermatology

## 2023-05-04 ENCOUNTER — Ambulatory Visit: Payer: 59 | Admitting: Dermatology

## 2023-05-04 VITALS — BP 139/92 | HR 86

## 2023-05-04 DIAGNOSIS — L669 Cicatricial alopecia, unspecified: Secondary | ICD-10-CM

## 2023-05-04 DIAGNOSIS — L810 Postinflammatory hyperpigmentation: Secondary | ICD-10-CM

## 2023-05-04 DIAGNOSIS — S0086XA Insect bite (nonvenomous) of other part of head, initial encounter: Secondary | ICD-10-CM | POA: Diagnosis not present

## 2023-05-04 DIAGNOSIS — F4323 Adjustment disorder with mixed anxiety and depressed mood: Secondary | ICD-10-CM | POA: Diagnosis not present

## 2023-05-04 MED ORDER — CICLOPIROX 1 % EX SHAM: 1.0000 | MEDICATED_SHAMPOO | Freq: Every day | CUTANEOUS | 3 refills | Status: DC

## 2023-05-04 MED ORDER — TACROLIMUS 0.03 % EX OINT: TOPICAL_OINTMENT | Freq: Two times a day (BID) | CUTANEOUS | 3 refills | Status: DC

## 2023-05-04 NOTE — Progress Notes (Signed)
New Patient Visit   Subjective  Christine Gardner is a 47 y.o. female who presents for the following: Spot Check on Left Cheek, Hair loss (Alopecia)  Spot Check(Left Cheek): Patient states she has spot located at the left cheek that she would like to have examined. Patient reports the areas have been there for  4  month(s). She reports the areas are not bothersome. She states that the areas have not spread. Patient reports she has been treated previously for this area. Patient denies Hx of bx. Patient denies family history of skin cancer(s).    Hairloss (Alopecia): Patient states she has spot located at the left cheek that she would like to have examined. Patient reports the areas have been there for several year(s). She reports the areas are somewhat bothersome. She states that the areas has not spread. Patient reports she has been treated previously for these areas by [facility] with on 04/08/2022 but had to change providers due to insurance changes. Patient denies Hx of bx. Patient denies family history of skin cancer(s).   The following portions of the chart were reviewed this encounter and updated as appropriate: medications, allergies, medical history  Review of Systems:  No other skin or systemic complaints except as noted in HPI or Assessment and Plan.  Objective  Well appearing patient in no apparent distress; mood and affect are within normal limits.  A focused examination was performed of the following areas: Left Cheek  Relevant exam findings are noted in the Assessment and Plan.       Assessment & Plan   Post-inflammatory hyperpigmentation  Related Medications tacrolimus (PROTOPIC) 0.03 % ointment Apply topically 2 (two) times daily.  Cicatricial alopecia  Related Medications Ciclopirox 1 % shampoo Apply 1 Application topically at bedtime. Shampoo hair once weekly or twice weekly as needed  Bug Bite  Exam: Post inflammatory papule left malar cheek  Treatment  Plan: - Overall initial lesion improving but still has some underlying inflammation located at the site -Start Tacrolimus 0.03% Ointment, Apply to affected area 2 times daily  Post Inflammation Hyperpigmentation Exam: Hyperpigment macule after bug bite on left Malar Cheek  Treatment Plan: - RX sent to [facility]   Central Cicatricial Centrifugal Alopecia (CCCA) Exam: Vertex of scalp hair thinning with clinical signs of scarring  Counseled the patient on the following:  The goal of therapy is to halt progression of disease and prevent further hair loss. In areas where the hair follicle has been replaced with fibrosis, regrowth is not possible. As the exact cause is not known, targeted therapy for CCCA is not available.  Treatment options for CCCA include anti-inflammatory agents such as:  -Potent topical steroids (eg clobetasol) or intralesional steroids -Calcineurin inhibitors: tacrolimus ointment, pimecrolimus cream -Tetracyclines (eg doxycycline 100 mg twice daily, taken for several weeks to months) -Hydroxychloroquine -Hair transplantation can be considered in individuals with well-controlled CCCA for at least one year. However, graft survival is low.  Discontinuation of traumatic hair care practices is an essential aspect of treatment of CCCA.--> Pt currently natural and does not relax her hair or wear tight styles  Continue to Avoid tight braids and weaves/extensions Continue Avoid hair style practices associated with discomfort, scalp irritation or scale Hair transplantation can be considered in individuals with well-controlled CCCA that has been stable for at least one year. However, graft survival is low.   Treatment Plan: - Medications will be sent to [facility] (Minoxidil7%/Tretinoin0.-125%/Finasteride0.1%, Spironolactone0.25%/Latanoprost 0.05%) -Apply medication in the mornings and  massage into scalp -Recommended not to put oil -Continue Low Prox,Shampoo  weekly or Bi-weekly    Return in 3 months (on 08/04/2023) for Alopecia & Spot Check F/U.    Documentation: I have reviewed the above documentation for accuracy and completeness, and I agree with the above.  Stasia Cavalier, am acting as scribe for Langston Reusing, DO.   Langston Reusing, DO

## 2023-05-04 NOTE — Patient Instructions (Addendum)
Instructions for [facility] Medications  One or more of your medications was sent to the [facility] mail order compounding pharmacy. You will receive an email from them and can purchase the medicine through that link. It will then be mailed to your home at the address you confirmed. If for any reason you do not receive an email from them, please check your spam folder. If you still do not find the email, please let us know. [facility] phone number is [phone].            Due to recent changes in healthcare laws, you may see results of your pathology and/or laboratory studies on MyChart before the doctors have had a chance to review them. We understand that in some cases there may be results that are confusing or concerning to you. Please understand that not all results are received at the same time and often the doctors may need to interpret multiple results in order to provide you with the best plan of care or course of treatment. Therefore, we ask that you please give us 2 business days to thoroughly review all your results before contacting the office for clarification. Should we see a critical lab result, you will be contacted sooner.   If You Need Anything After Your Visit  If you have any questions or concerns for your doctor, please call our main line at [phone] If no one answers, please leave a voicemail as directed and we will return your call as soon as possible. Messages left after 4 pm will be answered the following business day.   You may also send us a message via MyChart. We typically respond to MyChart messages within 1-2 business days.  For prescription refills, please ask your pharmacy to contact our office. Our fax number is [phone].  If you have an urgent issue when the clinic is closed that cannot wait until the next business day, you can page your doctor at the number below.    Please note that while we do our best to be available for  urgent issues outside of office hours, we are not available 24/7.   If you have an urgent issue and are unable to reach us, you may choose to seek medical care at your doctor's office, retail clinic, urgent care center, or emergency room.  If you have a medical emergency, please immediately call 911 or go to the emergency department. In the event of inclement weather, please call our main line at [phone] for an update on the status of any delays or closures.  Dermatology Medication Tips: Please keep the boxes that topical medications come in in order to help keep track of the instructions about where and how to use these. Pharmacies typically print the medication instructions only on the boxes and not directly on the medication tubes.   If your medication is too expensive, please contact our office at [phone] or send us a message through MyChart.   We are unable to tell what your co-pay for medications will be in advance as this is different depending on your insurance coverage. However, we may be able to find a substitute medication at lower cost or fill out paperwork to get insurance to cover a needed medication.   If a prior authorization is required to get your medication covered by your insurance company, please allow us 1-2 business days to complete this process.  Drug prices often vary depending on where the prescription is filled and some pharmacies   may offer cheaper prices.  The website www.goodrx.com contains coupons for medications through different pharmacies. The prices here do not account for what the cost may be with help from insurance (it may be cheaper with your insurance), but the website can give you the price if you did not use any insurance.  - You can print the associated coupon and take it with your prescription to the pharmacy.  - You may also stop by our office during regular business hours and pick up a GoodRx coupon card.  - If you need your prescription  sent electronically to a different pharmacy, notify our office through [place] MyChart or by phone at [phone]     

## 2023-05-16 ENCOUNTER — Telehealth: Payer: Self-pay

## 2023-05-16 ENCOUNTER — Encounter: Payer: Self-pay | Admitting: Dermatology

## 2023-05-16 ENCOUNTER — Other Ambulatory Visit: Payer: Self-pay

## 2023-05-16 MED ORDER — CLOBETASOL PROPIONATE 0.05 % EX CREA
1.0000 | TOPICAL_CREAM | Freq: Two times a day (BID) | CUTANEOUS | 0 refills | Status: DC
Start: 2023-05-16 — End: 2024-01-03

## 2023-05-16 NOTE — Telephone Encounter (Signed)
I reviewed the messages and her photos.  The bumps on her shoulder/arm look like bug bites and not hives.  However, the irritation on her hairline is most likely casued by the [facility].  The plan is the following:  -Stop [facility] compound for 2 weeks -Start applying clobetasol cream to affected areas on hairline and the bumps on her arms/ankles (of they are bites then the clobetasol is the treatment for that too) -After 2 weeks she will try to re-start the [facility] but only use it 3 nights a week (M-W-F).  Sometimes using it every night can be too much for some people.  -If she doesn't want to restart using the [facility] in 2 weeks and prefers to stop treatment, she can call [facility] and see what the return / refund policy is.  -Dr. Onalee Hua

## 2023-05-16 NOTE — Telephone Encounter (Signed)
I called her I regards to the message she left stating she is getting little red bumps along the hairline for about 3 days after starting [facility] alopecia compound one week ago. She also has about 8-10 hives that have come up on her arms and ankles. She says it is mildly itchy and is asking you if it will get better. It cost her $120. So she asks if she were to continue using it will these symptoms go away. Also, does [facility] offer a refund?

## 2023-06-06 MED ORDER — AMLODIPINE BESYLATE 10 MG PO TABS: 10.0000 mg | ORAL_TABLET | Freq: Every day | ORAL | 1 refills | Status: DC

## 2023-06-15 DIAGNOSIS — F4323 Adjustment disorder with mixed anxiety and depressed mood: Secondary | ICD-10-CM | POA: Diagnosis not present

## 2023-06-29 DIAGNOSIS — F4323 Adjustment disorder with mixed anxiety and depressed mood: Secondary | ICD-10-CM | POA: Diagnosis not present

## 2023-07-16 IMAGING — DX DG LUMBAR SPINE COMPLETE 4+V
5 series · 5 of 5 positions shown · non-contrast
Comparison: None.

CLINICAL DATA: Right low back pain.

EXAM:
LUMBAR SPINE - COMPLETE 4+ VIEW

[dg lumbar spine complete 4 +v (1 of 5)]
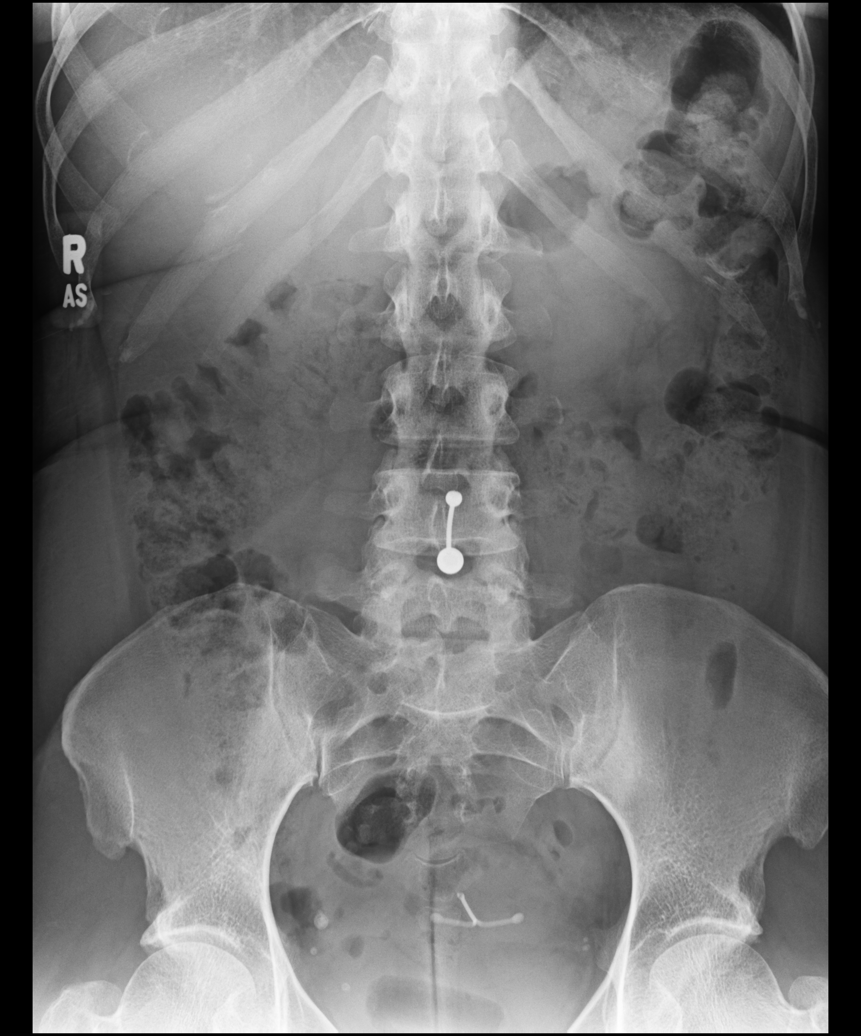

[dg lumbar spine complete 4 +v (2 of 5)]
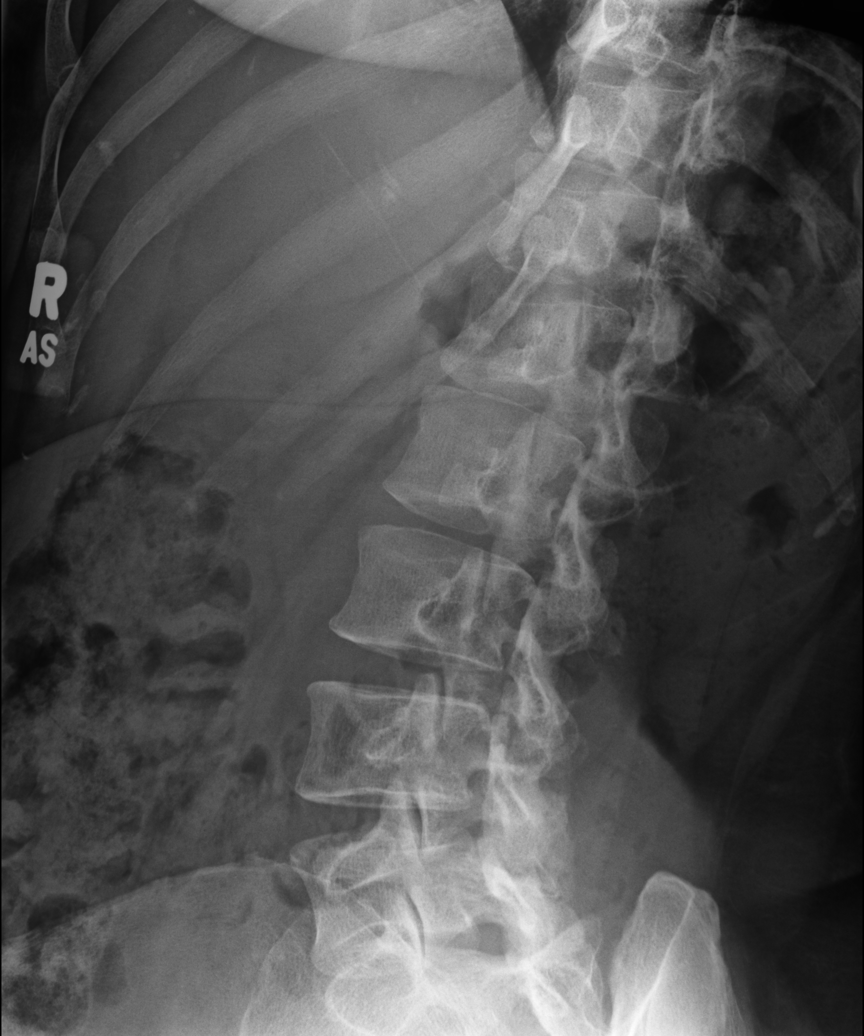

[dg lumbar spine complete 4 +v (3 of 5)]
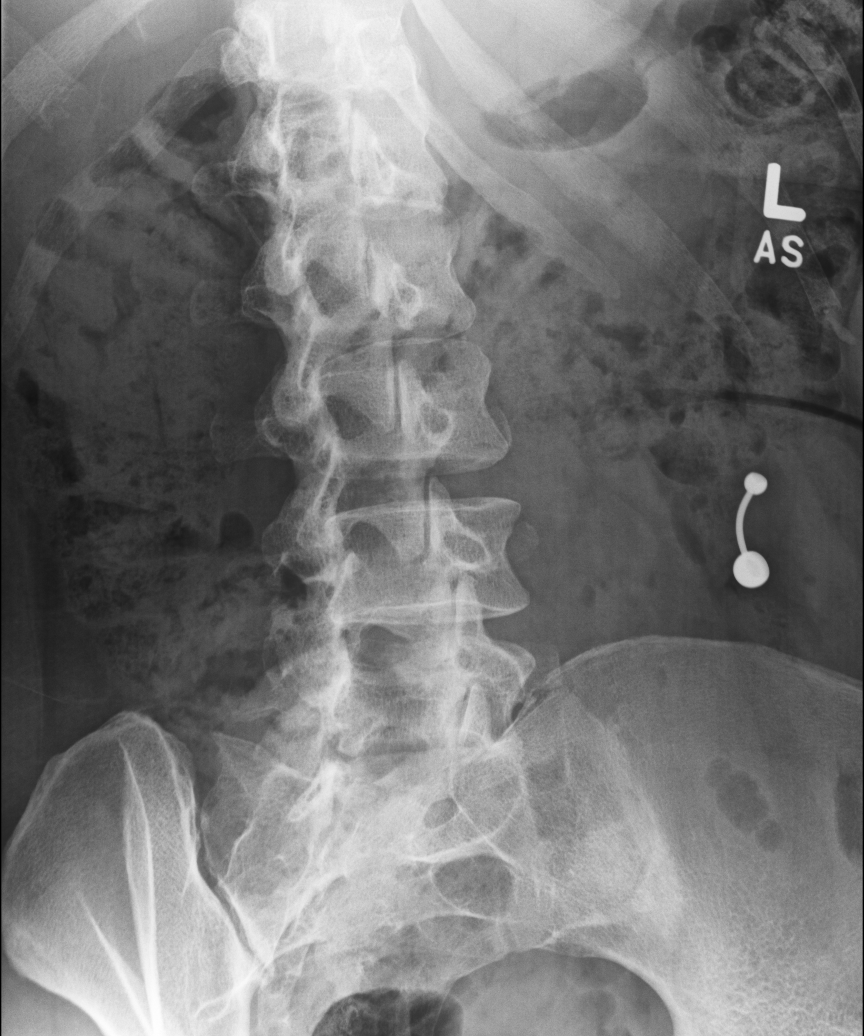

[dg lumbar spine complete 4 +v (4 of 5)]
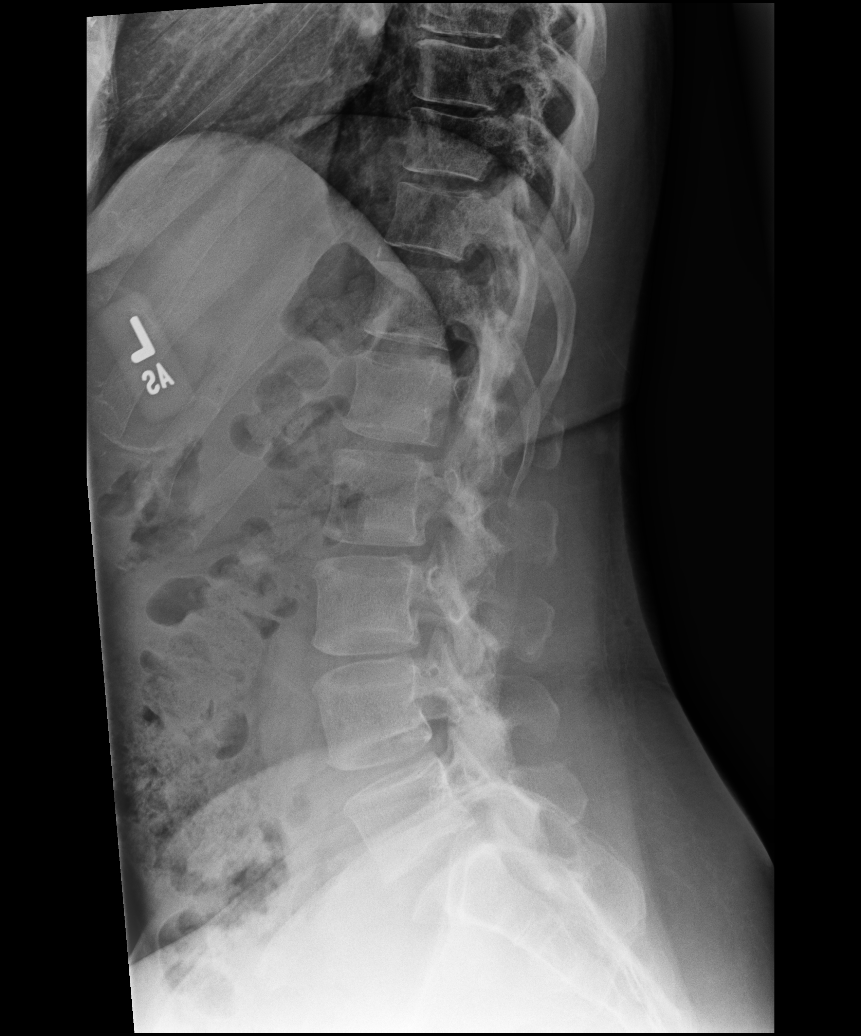

[dg lumbar spine complete 4 +v (5 of 5)]
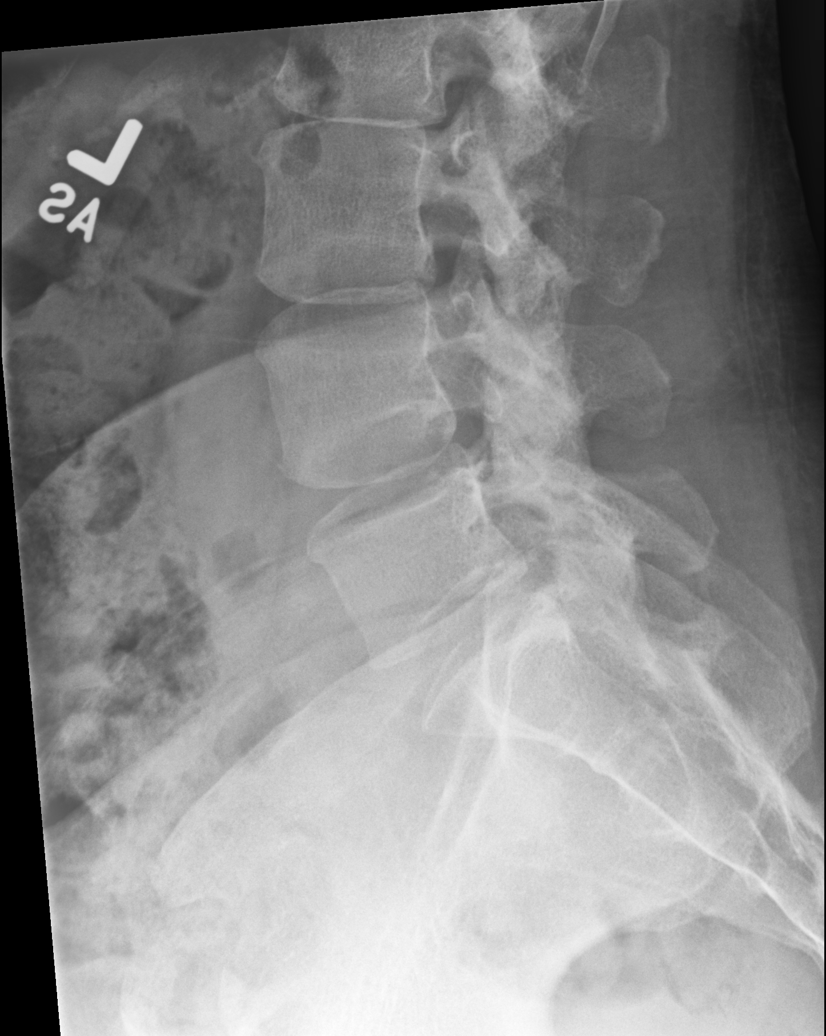

[5 of 5 positions shown; findings below may reference images not displayed]

FINDINGS: Five lumbar type vertebra. No acute fracture or subluxation of the
lumbar spine. The vertebral body heights are maintained. The
visualized posterior elements are intact. The soft tissues are
unremarkable. An intrauterine device is noted within the pelvis.
There is moderate stool throughout the colon.
IMPRESSION: Negative.

## 2023-08-22 NOTE — Progress Notes (Unsigned)
Christine Gardner D.[facility] [address] [place] [address] Phone: [phone]   Assessment and Plan:     There are no diagnoses linked to this encounter.  ***   Pertinent previous records reviewed include ***   Follow Up: ***     Subjective:   I, Christine Gardner, am serving as a Neurosurgeon for Doctor Richardean Sale   Chief Complaint: left foot pain    HPI:    03/25/2023 Patient is a 47 year old female complaining of left foot pain. Patient states left sided heel pain and right foot great toe pain.  She reports her left heel pain began several months ago and has been progressively worsening especially a couple days ago.  She has ordered a night splint however it has not yet arrived.  She has tried some morning stretching however her pain persists.  She does not walk barefoot and reports she changes her tennis shoes regularly.  She denies any running however she does walk on the treadmill and use the elliptical.  She denies any acute injury to this foot.  Most of her pain is located in the heel, with some recent radiation up into her ankle.     She was given heel cup, inserts and has been icing , she is TTP ankle and foot , when she stretches she feels a tightness or a pull up the calf, ibu for the pain and that doesn't really work, no MOI, no numbness or tingling, she will get pain driving , constant pain    04/19/2023 Patient states that she is better but not great, heel cup helps more than the inserts    08/23/2023 Patient states    Relevant Historical Information: HTN  Additional pertinent review of systems negative.   Current Outpatient Medications:    amLODipine (NORVASC) 10 MG tablet, Take 1 tablet (10 mg total) by mouth daily., Disp: 90 tablet, Rfl: 1   Cholecalciferol (VITAMIN D) 125 MCG (5000 UT) CAPS, Take 1 capsule by mouth daily at 12 noon., Disp: , Rfl:    Ciclopirox 1 % shampoo, Apply 1 Application topically at bedtime.  Shampoo hair once weekly or twice weekly as needed, Disp: 120 mL, Rfl: 3   clobetasol cream (TEMOVATE) 0.05 %, Apply 1 Application topically 2 (two) times daily. Apply twice daily to affected areas for 2 weeks then stop, Disp: 45 g, Rfl: 0   Cyanocobalamin (VITAMIN B 12 PO), Take 5,000 mcg by mouth daily., Disp: , Rfl:    diltiazem (CARDIZEM) 60 MG tablet, Take 1 tablet (60 mg total) by mouth every 6 (six) hours as needed., Disp: 30 tablet, Rfl: 5   diphenhydrAMINE (BENADRYL) 25 MG tablet, Take 1 tablet (25 mg total) by mouth every 6 (six) hours as needed for itching., Disp: 30 tablet, Rfl: 0   levonorgestrel (MIRENA) 20 MCG/24HR IUD, 1 each by Intrauterine route once., Disp: , Rfl:    meloxicam (MOBIC) 15 MG tablet, Take 1 tablet (15 mg total) by mouth daily., Disp: 30 tablet, Rfl: 0   Multiple Vitamin (MULTIVITAMIN) tablet, Take 1 tablet by mouth daily., Disp: , Rfl:    mupirocin ointment (BACTROBAN) 2 %, APPLY TO AFFECTED AREA 3 TIMES A DAY FOR 7 DAYS, Disp: , Rfl:    tacrolimus (PROTOPIC) 0.03 % ointment, Apply topically 2 (two) times daily., Disp: 60 g, Rfl: 3   Turmeric (QC TUMERIC COMPLEX PO), Take by mouth. Tumeric with Biopene Takes 1500 daily, Disp: , Rfl:  vitamin C (ASCORBIC ACID) 500 MG tablet, Take 500 mg by mouth daily., Disp: , Rfl:    Objective:     There were no vitals filed for this visit.    There is no height or weight on file to calculate BMI.    Physical Exam:    ***   Electronically signed by:  Christine Gardner D.[facility] 7:10 AM 08/22/23

## 2023-08-23 ENCOUNTER — Ambulatory Visit: Payer: 59 | Admitting: Sports Medicine

## 2023-08-23 VITALS — BP 110/78 | HR 101 | Ht 67.0 in | Wt 180.0 lb

## 2023-08-23 DIAGNOSIS — M25532 Pain in left wrist: Secondary | ICD-10-CM | POA: Diagnosis not present

## 2023-08-23 MED ORDER — MELOXICAM 15 MG PO TABS: 15.0000 mg | ORAL_TABLET | Freq: Every day | ORAL | 0 refills | Status: DC

## 2023-08-23 NOTE — Patient Instructions (Signed)
-   Start meloxicam 15 mg daily x2 weeks.  If still having pain after 2 weeks, complete 3rd-week of meloxicam. May use remaining meloxicam as needed once daily for pain control.  Do not to use additional NSAIDs while taking meloxicam.  May use Tylenol [phone] mg 2 to 3 times a day for breakthrough pain. 3 week follow up

## 2023-08-24 DIAGNOSIS — F4323 Adjustment disorder with mixed anxiety and depressed mood: Secondary | ICD-10-CM | POA: Diagnosis not present

## 2023-09-06 ENCOUNTER — Other Ambulatory Visit (HOSPITAL_BASED_OUTPATIENT_CLINIC_OR_DEPARTMENT_OTHER): Payer: 59

## 2023-09-06 DIAGNOSIS — Z800 Family history of malignant neoplasm of digestive organs: Secondary | ICD-10-CM

## 2023-09-06 DIAGNOSIS — N912 Amenorrhea, unspecified: Secondary | ICD-10-CM | POA: Diagnosis not present

## 2023-09-07 LAB — FOLLICLE STIMULATING HORMONE: FSH: 70.2 m[IU]/mL

## 2023-09-09 ENCOUNTER — Encounter (HOSPITAL_BASED_OUTPATIENT_CLINIC_OR_DEPARTMENT_OTHER): Payer: Self-pay | Admitting: Obstetrics & Gynecology

## 2023-09-20 ENCOUNTER — Other Ambulatory Visit: Payer: Self-pay | Admitting: Sports Medicine

## 2023-10-25 DIAGNOSIS — F4323 Adjustment disorder with mixed anxiety and depressed mood: Secondary | ICD-10-CM | POA: Diagnosis not present

## 2023-11-25 ENCOUNTER — Ambulatory Visit (INDEPENDENT_AMBULATORY_CARE_PROVIDER_SITE_OTHER): Payer: 59 | Admitting: Certified Nurse Midwife

## 2023-11-25 ENCOUNTER — Encounter (HOSPITAL_BASED_OUTPATIENT_CLINIC_OR_DEPARTMENT_OTHER): Payer: Self-pay | Admitting: Certified Nurse Midwife

## 2023-11-25 VITALS — BP 144/85 | HR 97 | Ht 67.0 in | Wt 184.8 lb

## 2023-11-25 DIAGNOSIS — Z30433 Encounter for removal and reinsertion of intrauterine contraceptive device: Secondary | ICD-10-CM

## 2023-11-25 DIAGNOSIS — Z30430 Encounter for insertion of intrauterine contraceptive device: Secondary | ICD-10-CM

## 2023-11-25 MED ORDER — LEVONORGESTREL 20 MCG/DAY IU IUD
1.0000 | INTRAUTERINE_SYSTEM | Freq: Once | INTRAUTERINE | Status: AC
Start: 2023-11-25 — End: 2023-11-25
  Administered 2023-11-25: 1 via INTRAUTERINE

## 2023-11-25 NOTE — Progress Notes (Signed)
Subjective:     Christine Gardner is a 47 y.o. female who presents for removal of Mirena IUD and insertion new Mirena IUD. Current Mirena was inserted 8 years ago (2016).   The following portions of the patient's history were reviewed and updated as appropriate: allergies, current medications, past family history, past medical history, past social history, past surgical history, and problem list.   Review of Systems Pertinent items are noted in HPI.    Objective:    BP (!) 144/85 (BP Location: Right Arm, Patient Position: Sitting, Cuff Size: Large)   Pulse 97   Ht 5\' 7"  (1.702 m)   Wt 184 lb 12.8 oz (83.8 kg)   BMI 28.94 kg/m  Pelvic: cervix normal in appearance, external genitalia normal, no adnexal masses or tenderness, no cervical motion tenderness, rectovaginal septum normal, uterus normal size, shape, and consistency, vagina normal without discharge, and IUD strings visible    Assessment:    Encounter for removal of IUD and insertion Mirena IUD .    Plan:      GYNECOLOGY OFFICE PROCEDURE NOTE  Christine Gardner is a 47 y.o. [id number] here for removal of IUD and new IUD insertion. No GYN concerns.  Last pap smear was on 04/2022 and was normal.  IUD Insertion Procedure Note Patient identified, informed consent performed, consent signed.   Discussed risks of irregular bleeding, cramping, infection, malpositioning or misplacement of the IUD outside the uterus which may require further procedure such as laparoscopy. Time out was performed.    Speculum placed in the vagina.  Cervix visualized. IUD strings visible 3cm, removed without difficulty with aid sponge forcep. Cervix cleaned with Betadine x 2.  Grasped anteriorly with a single tooth tenaculum.  Uterus sounded to 6 cm.  Mirena  IUD placed per manufacturer's recommendations.  Strings trimmed to 3 cm. Tenaculum was removed, good hemostasis noted.  Patient tolerated procedure well.   Patient was given post-procedure instructions.  She will  RTO for next scheduled annual gyn visit.  No follow-ups on file.   Letta Kocher, CNM 11:51 AM

## 2023-12-05 NOTE — Progress Notes (Signed)
 Ben Jara Feider D.[facility] [address] [place] [address] Phone: [phone]   Assessment and Plan:     1. Left wrist pain -Chronic with exacerbation, subsequent visit -Continued ulnar-sided wrist and forearm pain.  Most consistent with ECU strain  - X-ray obtained in clinic.  My interpretation: No acute fracture or dislocation -Patient elected for ECU CSI.  Tolerated well per note below - Recommend continuing to use wrist brace when physically active for the next 4 weeks - May continue Tylenol as needed for day-to-day pain relief - Discontinue meloxicam  use   Procedure: Ultrasound Guided extensor carpi ulnaris tendon Sheath Injection Side: Left Diagnosis: ECU strain US  Indication:  - accuracy is paramount for diagnosis - to ensure therapeutic efficacy or procedural success - to reduce procedural risk  After explaining the procedure, viable alternatives, risks, and answering any questions, consent was given verbally. The site was cleaned with chlorhexidine prep. An ultrasound transducer was placed on the ulnar wrist.  The ECU tendon was identified.  There was mild fluid surrounding the tendons and within the sheath. The tendons were Normal.  A steroid injection was performed under ultrasound guidance using  1ml of 1% lidocaine  without epinephrine  and 20 mg of Kenalog  40. This was well tolerated and resulted in  relief.  Needle was removed and dressing placed and post injection instructions were given including  a discussion of likely return of pain today after the anesthetic wears off (with the possibility of worsened pain) until the steroid starts to work in 1-3 days.   Pt was advised to call or return to clinic if these symptoms worsen or fail to improve as anticipated.    Pertinent previous records reviewed include none  Follow Up: 4 weeks for reevaluation.  If no improvement or worsening of symptoms, would consider wrist MRI    Subjective:   I, [name], am serving as a neurosurgeon for Doctor Morene Mace   Chief Complaint: left wrist pain    HPI:  08/23/2023 Patient is a 47 year old female complaining of left wrist pain. Patient states left wrist pain for about 2 months. Thinks she strained her wrist while working. No numbness or tingling. No meds for the pain. Decrease in grip strength. ROM WNL . Pain at rest isnt able to get any kind of relief    12/09/2023 Patient states that she is not any better    Relevant Historical Information: None pertinent  Additional pertinent review of systems negative.   Current Outpatient Medications:    amLODipine  (NORVASC ) 10 MG tablet, Take 1 tablet (10 mg total) by mouth daily., Disp: 90 tablet, Rfl: 1   Cholecalciferol (VITAMIN D ) 125 MCG (5000 UT) CAPS, Take 1 capsule by mouth daily at 12 noon., Disp: , Rfl:    Ciclopirox  1 % shampoo, Apply 1 Application topically at bedtime. Shampoo hair once weekly or twice weekly as needed, Disp: 120 mL, Rfl: 3   clobetasol  cream (TEMOVATE ) 0.05 %, Apply 1 Application topically 2 (two) times daily. Apply twice daily to affected areas for 2 weeks then stop, Disp: 45 g, Rfl: 0   Cyanocobalamin  (VITAMIN B 12 PO), Take 5,000 mcg by mouth daily., Disp: , Rfl:    diltiazem  (CARDIZEM ) 60 MG tablet, Take 1 tablet (60 mg total) by mouth every 6 (six) hours as needed., Disp: 30 tablet, Rfl: 5   diphenhydrAMINE  (BENADRYL ) 25 MG tablet, Take 1 tablet (25 mg total) by mouth every 6 (six) hours  as needed for itching., Disp: 30 tablet, Rfl: 0   levonorgestrel  (MIRENA ) 20 MCG/24HR IUD, 1 each by Intrauterine route once., Disp: , Rfl:    meloxicam  (MOBIC ) 15 MG tablet, Take 1 tablet (15 mg total) by mouth daily., Disp: 30 tablet, Rfl: 0   meloxicam  (MOBIC ) 15 MG tablet, Take 1 tablet (15 mg total) by mouth daily., Disp: 30 tablet, Rfl: 0   Multiple Vitamin (MULTIVITAMIN) tablet, Take 1 tablet by mouth daily., Disp: , Rfl:    mupirocin ointment  (BACTROBAN) 2 %, APPLY TO AFFECTED AREA 3 TIMES A DAY FOR 7 DAYS, Disp: , Rfl:    tacrolimus  (PROTOPIC ) 0.03 % ointment, Apply topically 2 (two) times daily., Disp: 60 g, Rfl: 3   Turmeric (QC TUMERIC COMPLEX PO), Take by mouth. Tumeric with Biopene Takes 1500 daily, Disp: , Rfl:    vitamin C (ASCORBIC ACID) 500 MG tablet, Take 500 mg by mouth daily., Disp: , Rfl:    Objective:     Vitals:   12/09/23 1332  BP: 126/84  Weight: 184 lb (83.5 kg)  Height: 5' 7 (1.702 m)      Body mass index is 28.82 kg/m.    Physical Exam:    General: Appears well, nad, nontoxic and pleasant Neuro:sensation intact, strength is 5/5 in upper extremities, muscle tone wnl Skin:no susupicious lesions or rashes  Left wrist:   No deformity or swelling appreciated. ROM  Ext 90, flexion70, radial/ulnar deviation 30 Pain with ulnar deviation nttp over the snuff box, dorsal carpals, volar carpals, radial styloid, ulnar styloid, 1st mcp, tfcc Negative Tinel's Negative finklestein Neg tfcc bounce test Ulnar-sided wrist pain with resisted wrist extension and ulnar deviation  Electronically signed by:  Odis Mace D.[facility] 1:45 PM 12/09/23

## 2023-12-09 ENCOUNTER — Ambulatory Visit (INDEPENDENT_AMBULATORY_CARE_PROVIDER_SITE_OTHER): Payer: 59

## 2023-12-09 ENCOUNTER — Other Ambulatory Visit: Payer: Self-pay

## 2023-12-09 ENCOUNTER — Ambulatory Visit: Payer: 59 | Admitting: Sports Medicine

## 2023-12-09 VITALS — BP 126/84 | Ht 67.0 in | Wt 184.0 lb

## 2023-12-09 DIAGNOSIS — M778 Other enthesopathies, not elsewhere classified: Secondary | ICD-10-CM

## 2023-12-09 DIAGNOSIS — M1812 Unilateral primary osteoarthritis of first carpometacarpal joint, left hand: Secondary | ICD-10-CM | POA: Diagnosis not present

## 2023-12-09 DIAGNOSIS — M25532 Pain in left wrist: Secondary | ICD-10-CM | POA: Diagnosis not present

## 2023-12-30 ENCOUNTER — Encounter: Payer: Self-pay | Admitting: Sports Medicine

## 2024-01-02 ENCOUNTER — Other Ambulatory Visit: Payer: Self-pay | Admitting: Sports Medicine

## 2024-01-02 DIAGNOSIS — M722 Plantar fascial fibromatosis: Secondary | ICD-10-CM

## 2024-01-02 DIAGNOSIS — G8929 Other chronic pain: Secondary | ICD-10-CM

## 2024-01-02 DIAGNOSIS — M778 Other enthesopathies, not elsewhere classified: Secondary | ICD-10-CM

## 2024-01-02 DIAGNOSIS — M79672 Pain in left foot: Secondary | ICD-10-CM

## 2024-01-02 DIAGNOSIS — M25532 Pain in left wrist: Secondary | ICD-10-CM

## 2024-01-02 DIAGNOSIS — M2141 Flat foot [pes planus] (acquired), right foot: Secondary | ICD-10-CM

## 2024-01-03 ENCOUNTER — Ambulatory Visit: Payer: 59 | Admitting: Medical

## 2024-01-03 VITALS — Temp 97.6°F | Wt 190.4 lb

## 2024-01-03 DIAGNOSIS — I10 Essential (primary) hypertension: Secondary | ICD-10-CM

## 2024-01-03 DIAGNOSIS — R42 Dizziness and giddiness: Secondary | ICD-10-CM | POA: Diagnosis not present

## 2024-01-03 DIAGNOSIS — R519 Headache, unspecified: Secondary | ICD-10-CM | POA: Diagnosis not present

## 2024-01-03 DIAGNOSIS — Z8669 Personal history of other diseases of the nervous system and sense organs: Secondary | ICD-10-CM | POA: Diagnosis not present

## 2024-01-03 LAB — BASIC METABOLIC PANEL
BUN/Creatinine Ratio: 11 (ref 9–23)
BUN: 8 mg/dL (ref 6–24)
CO2: 23 mmol/L (ref 20–29)
Calcium: 9.6 mg/dL (ref 8.7–10.2)
Chloride: 104 mmol/L (ref 96–106)
Creatinine, Ser: 0.73 mg/dL (ref 0.57–1.00)
Glucose: 99 mg/dL (ref 70–99)
Potassium: 4.6 mmol/L (ref 3.5–5.2)
Sodium: 141 mmol/L (ref 134–144)
eGFR: 101 mL/min/{1.73_m2} (ref >59)

## 2024-01-03 LAB — CBC
Hematocrit: 41.4 % (ref 34.0–46.6)
Hemoglobin: 13.7 g/dL (ref 11.1–15.9)
MCH: 32.8 pg (ref 26.6–33.0)
MCHC: 33.1 g/dL (ref 31.5–35.7)
MCV: 99 fL — ABNORMAL HIGH (ref 79–97)
Platelets: 275 10*3/uL (ref 150–450)
RBC: 4.18 x10E6/uL (ref 3.77–5.28)
RDW: 12 % (ref 11.7–15.4)
WBC: 6.1 10*3/uL (ref 3.4–10.8)

## 2024-01-03 NOTE — Progress Notes (Signed)
Subjective:  Christine Gardner is a 48 y.o. female who presents for Chief Complaint  Patient presents with   Dizziness    Dizziness just in the mornings. Not everyday. Last episode was Sunday, headaches feels like a brain freeze. Had several headaches this month     Here for c/o dizziness, headaches, lightheaded.  Started 2 weeks ago.   One morning got up from bed, felt dizzy and lightheaded.   That entire week felt tired, slept through alarms, didn't work out.   Drinking espresso to get through the day.   At end of the week, started feeling better, but then another episode recurred Sunday 2 days ago.  Having pressure behind eyes, several headaches in last month, mental fog/brain freeze.  Didn't take anything for symptoms. On sides of temples hurts at times, yesterday pain in back of head.   Dizziness can last at least an hour.   Doesn't feel off balance, feels more of a fog in head.    No ringing in ears.     Not having periods, entering menopause as of last year.  Typically drinks 64oz water daily but not as much as she would like lately.    Eating 2 times daily.  Regarding headaches, no new numbing, tingling, weakness, no double vision or vision changes, no slurred speech.    Has hx/o migraines, and has had some migraines of recent.  No recent SOB, chest pain or palpitations.   BPs at home in recent weeks  - mostly normal readings.  Has cut back on alcohol of late as well.  No recent photophobia or phonophobia.   Had nausea the first day of symptoms but none since.   No worse headache with physical activity or sex.     No other aggravating or relieving factors.    No other c/o.  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; Flagyl   Constipation    Family history of breast cancer    Family history of pancreatic cancer    Hypertension 2016   IUD (intrauterine device) in place 2019   Mirena   Migraine    Seasonal allergic rhinitis     allergy testing 06/2013 with [facility]   Vitamin D deficiency    Current Outpatient Medications on File Prior to Visit  Medication Sig Dispense Refill   amLODipine (NORVASC) 10 MG tablet Take 1 tablet (10 mg total) by mouth daily. 90 tablet 1   diltiazem (CARDIZEM) 60 MG tablet Take 1 tablet (60 mg total) by mouth every 6 (six) hours as needed. 30 tablet 5   levonorgestrel (MIRENA) 20 MCG/24HR IUD 1 each by Intrauterine route once.     diphenhydrAMINE (BENADRYL) 25 MG tablet Take 1 tablet (25 mg total) by mouth every 6 (six) hours as needed for itching. (Patient not taking: Reported on 01/03/2024) 30 tablet 0   No current facility-administered medications on file prior to visit.     The following portions of the patient's history were reviewed and updated as appropriate: allergies, current medications, past family history, past medical history, past social history, past surgical history and problem list.  ROS Otherwise as in subjective above    Objective: Temp 97.6 F (36.4 C)   Wt 190 lb 6.4 oz (86.4 kg)   BMI 29.82 kg/m   BP Readings from Last 3 Encounters:  12/09/23 126/84  11/25/23 (!) 144/85  08/23/23 110/78   Wt Readings from Last 3 Encounters:  01/03/24  190 lb 6.4 oz (86.4 kg)  12/09/23 184 lb (83.5 kg)  11/25/23 184 lb 12.8 oz (83.8 kg)    General appearance: alert, no distress, well developed, well nourished, somewhat fatigued appearing HEENT: normocephalic, sclerae anicteric, conjunctiva pink and moist, TMs pearly, nares patent, no discharge or erythema, pharynx normal Oral cavity: MMM, no lesions Neck: supple, no lymphadenopathy, no thyromegaly, no masses, no bruits Heart: RRR, normal S1, S2, no murmurs Lungs: CTA bilaterally, no wheezes, rhonchi, or rales Abdomen: +bs, soft, non tender, non distended, no masses, no hepatomegaly, no splenomegaly Pulses: 2+ radial pulses, 2+ pedal pulses, normal cap refill Ext: no edema Neuro: CN II through XII intact, negative  Romberg, normal Psych: Pleasant, answers questions appropriately  Assessment: Encounter Diagnoses  Name Primary?   Dizziness Yes   Nonintractable headache, unspecified chronicity pattern, unspecified headache type    Essential hypertension    History of migraine      Plan: We discussed her new symptoms.  Orthostatic vital signs normal today.  We discussed the wide differential which can include migraines, vertigo, allergy and sinus congestion, sinus infection, electrolyte disturbance, aneurysm, brain tumor or other.   She does have an underlying history of migraines and she is on medication for blood pressure which is usually normal readings at home.  In recent months she has not been on preventative migraine medication but headaches have only gotten worse in the last couple weeks.  Advised to increase water intake, limit caffeine.  Since she has had some congestion she can use Sudafed twice a day and/or allergy pill for the next 3 to 5 days to see if that helps.  Labs today to further evaluate  Advised she call back within 3 days to give an update on symptoms.  If any new neurological symptoms as discussed, high blood pressure readings, or worse symptoms overall such as worst headache of life then call 911 or go to the emergency department  If no improvement over the next several days or if new symptoms then we should consider head scan as well.  We discussed potentially getting head imaging given his symptoms.   [name] was seen today for dizziness.  Diagnoses and all orders for this visit:  Dizziness -     CBC -     Basic metabolic panel  Nonintractable headache, unspecified chronicity pattern, unspecified headache type -     CBC -     Basic metabolic panel  Essential hypertension  History of migraine    Follow up: pending labs

## 2024-01-04 ENCOUNTER — Telehealth: Payer: Self-pay | Admitting: Cardiovascular Disease

## 2024-01-04 DIAGNOSIS — Z6829 Body mass index (BMI) 29.0-29.9, adult: Secondary | ICD-10-CM

## 2024-01-04 NOTE — Telephone Encounter (Signed)
Spoke with pt, she is open to seeing who ever we usually refer to. Aware dr Duke Salvia has left the office for today but will forward for her review.

## 2024-01-04 NOTE — Telephone Encounter (Signed)
Patient called to follow up. Advised that referral was entered and gave their office number to call to schedule.

## 2024-01-04 NOTE — Telephone Encounter (Signed)
Pt requesting a cb to discuss getting a referral to the weight loss clinic

## 2024-01-04 NOTE — Progress Notes (Signed)
Results sent through MyChart

## 2024-01-05 ENCOUNTER — Other Ambulatory Visit: Payer: Self-pay | Admitting: Sports Medicine

## 2024-01-05 DIAGNOSIS — M778 Other enthesopathies, not elsewhere classified: Secondary | ICD-10-CM

## 2024-01-05 DIAGNOSIS — M25532 Pain in left wrist: Secondary | ICD-10-CM

## 2024-01-06 ENCOUNTER — Ambulatory Visit: Payer: 59 | Admitting: Sports Medicine

## 2024-01-09 NOTE — Therapy (Signed)
 OUTPATIENT OCCUPATIONAL THERAPY ORTHO EVALUATION  Patient Name: Christine Gardner MRN: [id number] DOB:Nov 05, 1976, 48 y.o., female Today's Date: 01/10/2024  PCP: Bulah Alm RAMAN, PA-C REFERRING PROVIDER: Leonce Katz, DO  END OF SESSION:  OT End of Session - 01/10/24 1229     Visit Number 1    Number of Visits 7    Date for OT Re-Evaluation 02/24/24    Authorization Type [employer] - VL: 30    OT Start Time 1100    OT Stop Time 1145    OT Time Calculation (min) 45 min    Activity Tolerance Patient tolerated treatment well    Behavior During Therapy [facility] for tasks assessed/performed             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; Flagyl   Constipation    Family history of breast cancer    Family history of pancreatic cancer    Hypertension 2016   IUD (intrauterine device) in place 2019   Mirena    Migraine    Seasonal allergic rhinitis    allergy testing 06/2013 with [place]   Vitamin D  deficiency    Past Surgical History:  Procedure Laterality Date   ABDOMINOPLASTY  12/2019   COLONOSCOPY  12/2015   polyp.   [place], Dr. 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   Screening for lipid disorders 01/18/2022   Family history of pancreatic cancer 01/18/2022   Vaccine counseling 01/06/2021   PSVT (paroxysmal supraventricular tachycardia) (HCC) 04/09/2019    Palpitation 09/12/2018   Medication intolerance 08/19/2016   Encounter for health maintenance examination in adult 07/20/2016   Rhinitis, allergic 07/20/2016   Vitamin D  deficiency 07/20/2016   IUD (intrauterine device) in place 07/20/2016   Influenza vaccination declined 07/20/2016   Essential hypertension 06/11/2015   Constipation 06/11/2015    ONSET DATE: 01/05/2024 (referral date) DATE OF INJURY: Sept 2024 at work  REFERRING DIAG: M25.532 (ICD-10-CM) - Left wrist pain M77.8 (ICD-10-CM) - Extensor carpi ulnaris tendinitis  THERAPY DIAG:  Pain in left wrist  Other disturbances of skin sensation  Muscle weakness (generalized)  Localized edema  Rationale for Evaluation and Treatment: Rehabilitation  SUBJECTIVE:   SUBJECTIVE STATEMENT: I can still do everything but the pain has not gotten any better. In fact, I think it's gotten worse the last few weeks.  Pt accompanied by: self  PERTINENT HISTORY: none significant **A steroid injection was performed on 12/09/23. No acute fractures per MD (Dr. Leonce) noted on 12/09/23  PRECAUTIONS: None  RED FLAGS: None   WEIGHT BEARING RESTRICTIONS: No  PAIN:  Are you having pain? Yes: NPRS scale: 0-9/10 Pain location: ulnar side of wrist (more volar) Pain description: achy, fluctuates Aggravating factors: certain movements (especially wrist flex and  prolonged ulnar deviation) and sometimes at rest Relieving factors: brace (injection only helped for about 2 weeks)   FALLS: Has patient fallen in  last 6 months? No  LIVING ENVIRONMENT: Lives with: lives with their family   PLOF: Independent and Vocation/Vocational requirements: manicurist, real estate agent  PATIENT GOALS: decrease pain   OBJECTIVE:  Note: Objective measures were completed at Evaluation unless otherwise noted.  HAND DOMINANCE: Right  ADLs: Overall ADLs: WFLs  FUNCTIONAL OUTCOME MEASURES: Quick Dash: TBA  UPPER EXTREMITY ROM:   BUE AROM WNL's (pain  with wrist flexion)    UPPER EXTREMITY MMT:   WFL's (no pain with wrist testing)    HAND FUNCTION: Grip strength: Right: 66 lbs; Left: 65 lbs and  3 point pinch: Right: 17 lbs, Left: 16 lbs  COORDINATION: WFL's  SENSATION: Achy/slight numbness infrequently at ulnar side of wrist/proximal forearm  EDEMA: only localized to ulnar wrist  COGNITION: Overall cognitive status: Within functional limits for tasks assessed  OBSERVATIONS: pain with active wrist flexion and prolonged UD or weighted UD, BUT no pain with active UD   TREATMENT DATE: 01/10/24  Explained O.T. findings and POC.  Also discussed general positioning w/ wt bearing ex's and when typing to reduce/prevent pain - to be more aware of positions that provoke pain and ways to adjust wrist to a more neutral position.                                                                                                                               PATIENT EDUCATION: Education details: see above Person educated: Patient Education method: Explanation Education comprehension: verbalized understanding  HOME EXERCISE PROGRAM: N/A  GOALS: Goals reviewed with patient? Yes  SHORT TERM GOALS: Target date: 02/02/24  Pt to be independent with initial HEP Baseline: Goal status: INITIAL  2.  Pt to id pain management strategies (bracing, taping, modalities, task modifications, A/E) Baseline:  Goal status: INITIAL    LONG TERM GOALS: Target date: 02/24/24  Independent with updated HEP  Baseline:  Goal status: INITIAL  2.  Quick Dash TBA and goal updated prn Baseline:  Goal status: INITIAL  3.  Pain no greater than 5/10 with all functional tasks Baseline: up to 9/10 Goal status: INITIAL   ASSESSMENT:  CLINICAL IMPRESSION: Patient is a 48 y.o. female who was seen today for occupational therapy evaluation for Lt wrist pain, ECU tendonitis. Patient currently presents slightly below baseline level of functioning  demonstrating functional deficits and impairments as noted below. Pt would benefit from skilled OT services in the outpatient setting to work on impairments as noted below to help pt return to PLOF as able.   SABRA   PERFORMANCE DEFICITS: in functional skills including IADLs, edema, strength, pain, body mechanics, decreased knowledge of use of DME, and UE functional use.   IMPAIRMENTS: are limiting patient from ADLs, IADLs, work, and leisure.   COMORBIDITIES: has no other co-morbidities that affects occupational performance. Patient will benefit from skilled OT to address above impairments and improve overall function.  MODIFICATION OR ASSISTANCE TO COMPLETE EVALUATION: No modification of tasks  or assist necessary to complete an evaluation.  OT OCCUPATIONAL PROFILE AND HISTORY: Problem focused assessment: Including review of records relating to presenting problem.  CLINICAL DECISION MAKING: Moderate - several treatment options, min-mod task modification necessary  REHAB POTENTIAL: Good  EVALUATION COMPLEXITY: Low      PLAN:  OT FREQUENCY: 1x/week  OT DURATION: 6 weeks plus evaluation  PLANNED INTERVENTIONS: 97535 self care/ADL training, 02889 therapeutic exercise, 97530 therapeutic activity, 97140 manual therapy, 97035 ultrasound, 97018 paraffin, 02960 fluidotherapy, 97010 moist heat, 97010 cryotherapy, 97034 contrast bath, 97033 iontophoresis, 97760 Orthotics management and training, 02239 Splinting (initial encounter), [id number] Subsequent splinting/medication, passive range of motion, patient/family education, and DME and/or AE instructions  RECOMMENDED OTHER SERVICES: none at this time  CONSULTED AND AGREED WITH PLAN OF CARE: Patient  PLAN FOR NEXT SESSION: assess Quick Dash and update goal, try pulsed US  for edema and pain relief, taping, isometric wrist strengthening   Burnard JINNY Roads, OT 01/10/2024, 12:31 PM

## 2024-01-10 ENCOUNTER — Ambulatory Visit: Payer: 59 | Attending: Sports Medicine | Admitting: Occupational Therapy

## 2024-01-10 ENCOUNTER — Encounter: Payer: Self-pay | Admitting: Occupational Therapy

## 2024-01-10 DIAGNOSIS — M778 Other enthesopathies, not elsewhere classified: Secondary | ICD-10-CM | POA: Diagnosis not present

## 2024-01-10 DIAGNOSIS — R600 Localized edema: Secondary | ICD-10-CM | POA: Insufficient documentation

## 2024-01-10 DIAGNOSIS — R208 Other disturbances of skin sensation: Secondary | ICD-10-CM | POA: Diagnosis not present

## 2024-01-10 DIAGNOSIS — M25532 Pain in left wrist: Secondary | ICD-10-CM | POA: Insufficient documentation

## 2024-01-10 DIAGNOSIS — M6281 Muscle weakness (generalized): Secondary | ICD-10-CM | POA: Insufficient documentation

## 2024-01-19 NOTE — Therapy (Unsigned)
OUTPATIENT OCCUPATIONAL THERAPY ORTHO TREATMENT  Patient Name: Christine Gardner MRN: [id number] DOB:[birth year], 48 y.o., female Today's Date: 01/20/2024  PCP: Jac Canavan, PA-C REFERRING PROVIDER: Richardean Sale, DO  END OF SESSION:  OT End of Session - 01/20/24 1229     Visit Number 2    Number of Visits 7    Date for OT Re-Evaluation 02/24/24    Authorization Type [employer] - VL: 30    OT Start Time 1234    OT Stop Time 1315    OT Time Calculation (min) 41 min    Activity Tolerance Patient tolerated treatment well    Behavior During Therapy [facility] for tasks assessed/performed             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; Flagyl   Constipation    Family history of breast cancer    Family history of pancreatic cancer    Hypertension 2016   IUD (intrauterine device) in place 2019   Mirena   Migraine    Seasonal allergic rhinitis    allergy testing 06/2013 with [place]   Vitamin D deficiency    Past Surgical History:  Procedure Laterality Date   ABDOMINOPLASTY  12/2019   COLONOSCOPY  12/2015   polyp.   [place], Dr. 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   Screening for lipid disorders 01/18/2022   Family history of pancreatic cancer 01/18/2022   Vaccine counseling 01/06/2021   PSVT (paroxysmal supraventricular tachycardia) (HCC) 04/09/2019    Palpitation 09/12/2018   Medication intolerance 08/19/2016   Encounter for health maintenance examination in adult 07/20/2016   Rhinitis, allergic 07/20/2016   Vitamin D deficiency 07/20/2016   IUD (intrauterine device) in place 07/20/2016   Influenza vaccination declined 07/20/2016   Essential hypertension 06/11/2015   Constipation 06/11/2015    ONSET DATE: 01/05/2024 (referral date) DATE OF INJURY: Sept 2024 at work  REFERRING DIAG: M25.532 (ICD-10-CM) - Left wrist pain M77.8 (ICD-10-CM) - Extensor carpi ulnaris tendinitis  THERAPY DIAG:  Pain in left wrist  Other disturbances of skin sensation  Muscle weakness (generalized)  Localized edema  Rationale for Evaluation and Treatment: Rehabilitation  SUBJECTIVE:   SUBJECTIVE STATEMENT: Pt states she tried to go without using her brace with a manicure and had to put it back on. She really wants to go without the brace.   Pt accompanied by: self  PERTINENT HISTORY: none significant **A steroid injection was performed on 12/09/23. No acute fractures per MD (Dr. Jean Rosenthal) noted on 12/09/23  PRECAUTIONS: None  RED FLAGS: None   WEIGHT BEARING RESTRICTIONS: No  PAIN:  Are you having pain? Yes: NPRS scale:  at rest 0/10 Pain location: ulnar side of wrist (more volar) Pain description: achy, fluctuates Aggravating factors: certain movements (especially wrist flex and  prolonged ulnar deviation) and sometimes at rest Relieving factors: brace (injection only helped for about 2  weeks)  Worst pain in last 24 hours: 5/10 (without brace)  FALLS: Has patient fallen in last 6 months? No  LIVING ENVIRONMENT: Lives with: lives with their family  PLOF: Independent and Vocation/Vocational requirements: manicurist, real estate agent  PATIENT GOALS: decrease pain  OBJECTIVE:  Note: Objective measures were completed at Evaluation unless otherwise noted.  HAND DOMINANCE: Right  ADLs: Overall ADLs: WFLs  FUNCTIONAL OUTCOME  MEASURES: Quick Dash: 20.5 % disability with use of L hand   UPPER EXTREMITY ROM:   BUE AROM WNL's (pain with wrist flexion)   UPPER EXTREMITY MMT:   WFL's (no pain with wrist testing)   HAND FUNCTION: Grip strength: Right: 66 lbs; Left: 65 lbs and  3 point pinch: Right: 17 lbs, Left: 16 lbs  COORDINATION: WFL's  SENSATION: Achy/slight numbness infrequently at ulnar side of wrist/proximal forearm  EDEMA: only localized to ulnar wrist  COGNITION: Overall cognitive status: Within functional limits for tasks assessed  OBSERVATIONS: pain with active wrist flexion and prolonged UD or weighted UD, BUT no pain with active UD  TREATMENT:   - Ultrasound completed for duration as noted below including:  Ultrasound applied to palmar left wrist and forearm for 8 minutes, frequency of 3 MHz, 20% duty cycle, and 1.1 W/cm with pt's arm placed on soft towel for promotion of ROM, edema reduction, and pain reduction in affected extremity. Per subjective and pt demonstration of holding client's hand (pt in ulnar deviation), OT educated pt on the need to wear brace for now to allow area to rest as well as for her to learn ergonomics/positioning as needed to reduce likelihood of recurrence. Pt educated to keep wrists in neutral position and to support with rolled up towel in instances like when she is holding a client's hand for a manicure.  Taping education provided and kinesiotape applied using space correction technique along L ulnar forearm. Pt instructed on wear and care.  OT initiated wrist isometrics (flexion and extension) as noted in pt instructions for strengthening of LUE.                                                                                                                            PATIENT EDUCATION: Education details: see above Person educated: Patient Education method: Programmer, multimedia, Demonstration, Verbal cues, and Handouts Education comprehension: verbalized understanding,  returned demonstration, verbal cues required, and needs further education  HOME EXERCISE PROGRAM: 01/20/2024: wrist flex/ext isometrics  GOALS: Goals reviewed with patient? Yes  SHORT TERM GOALS: Target date: 02/02/24  Pt to be independent with initial HEP Baseline: Goal status: IN PROGRESS  2.  Pt to id pain management strategies (bracing, taping, modalities, task modifications, A/E) Baseline:  Goal status: INITIAL  LONG TERM GOALS: Target date: 02/24/24  Independent with updated HEP  Baseline:  Goal status: INITIAL  2.  Quick Dash TBA and goal updated prn Baseline:  Goal status: INITIAL  3.  Pain no greater than 5/10 with all functional  tasks Baseline: up to 9/10 Goal status: INITIAL  ASSESSMENT:  CLINICAL IMPRESSION: Patient demonstrates good understanding of HEP and strategies as noted to progress towards goals. Will benefit from additional ergonomic and positioning education to manage pain and prevent recurrence.   PERFORMANCE DEFICITS: in functional skills including IADLs, edema, strength, pain, body mechanics, decreased knowledge of use of DME, and UE functional use.   IMPAIRMENTS: are limiting patient from ADLs, IADLs, work, and leisure.   COMORBIDITIES: has no other co-morbidities that affects occupational performance. Patient will benefit from skilled OT to address above impairments and improve overall function.  REHAB POTENTIAL: Good  PLAN:  OT FREQUENCY: 1x/week  OT DURATION: 6 weeks plus evaluation  PLANNED INTERVENTIONS: 97535 self care/ADL training, 16109 therapeutic exercise, 97530 therapeutic activity, 97140 manual therapy, 97035 ultrasound, 97018 paraffin, 60454 fluidotherapy, 97010 moist heat, 97010 cryotherapy, 97034 contrast bath, 97033 iontophoresis, 97760 Orthotics management and training, 09811 Splinting (initial encounter), [id number] Subsequent splinting/medication, passive range of motion, patient/family education, and DME and/or AE  instructions  RECOMMENDED OTHER SERVICES: none at this time  CONSULTED AND AGREED WITH PLAN OF CARE: Patient  PLAN FOR NEXT SESSION:  pulsed [place] (how did this go?) for edema and pain relief, taping (how did this go?) , review isometric wrist strengthening; educate/provide handouts for joint protection, ergonomics, positioning   Delana Meyer, OT 01/20/2024, 2:06 PM

## 2024-01-20 ENCOUNTER — Ambulatory Visit: Payer: 59 | Admitting: Occupational Therapy

## 2024-01-20 DIAGNOSIS — M25532 Pain in left wrist: Secondary | ICD-10-CM

## 2024-01-20 DIAGNOSIS — R600 Localized edema: Secondary | ICD-10-CM | POA: Diagnosis not present

## 2024-01-20 DIAGNOSIS — R208 Other disturbances of skin sensation: Secondary | ICD-10-CM

## 2024-01-20 DIAGNOSIS — M6281 Muscle weakness (generalized): Secondary | ICD-10-CM | POA: Diagnosis not present

## 2024-01-20 DIAGNOSIS — M778 Other enthesopathies, not elsewhere classified: Secondary | ICD-10-CM | POA: Diagnosis not present

## 2024-01-20 NOTE — Patient Instructions (Signed)
Wrist Extension: Isometric    With left forearm resting palm down on thigh, resist upward movement of hand with other hand. Hold _5_ seconds. Relax. Repeat __10__ times per set. Do __2__ sessions per day.   Wrist Flexion: Isometric    With left forearm resting palm up on thigh, resist upward movement of hand with other hand. Hold __5__ seconds. Relax. Repeat __10__ times per set. Do __2__ sessions per day.  Copyright  VHI. All rights reserved.

## 2024-01-23 NOTE — Telephone Encounter (Signed)
 Dr Duke Salvia recommended referral to [facility].  Referral placed and mychart message to patient

## 2024-01-25 ENCOUNTER — Ambulatory Visit: Payer: 59 | Admitting: Occupational Therapy

## 2024-01-26 ENCOUNTER — Encounter: Payer: 59 | Admitting: Medical

## 2024-01-27 ENCOUNTER — Encounter: Payer: 59 | Admitting: Occupational Therapy

## 2024-01-27 NOTE — Telephone Encounter (Signed)
 Patient read my chart message

## 2024-01-31 ENCOUNTER — Encounter: Payer: Self-pay | Admitting: Occupational Therapy

## 2024-01-31 NOTE — Therapy (Signed)
 [facility] [address] [place], [place], [address] Phone: [phone]   Fax:  [phone]  Patient Details  Name: Christine Gardner MRN: [id number] Date of Birth: 01-14-1976 Referring Provider:  No ref. provider found  Encounter Date: 01/31/2024  Pt called to cancel all remaining O.T. appointments stating she "wants to try something else" (via front office). Pt was only seen for evaluation and 1 treatment session and therefore did not meet any goals. Will d/c episode of care at this time per pt request   Sheran Lawless, OT 01/31/2024, 12:08 PM  [facility] [address] [place], [place], [address] Phone: [phone]   Fax:  [phone]

## 2024-02-01 ENCOUNTER — Ambulatory Visit: Payer: 59 | Admitting: Occupational Therapy

## 2024-02-08 ENCOUNTER — Encounter: Payer: 59 | Admitting: Occupational Therapy

## 2024-02-18 ENCOUNTER — Other Ambulatory Visit: Payer: Self-pay | Admitting: Medical

## 2024-02-23 ENCOUNTER — Encounter: Payer: Self-pay | Admitting: Medical

## 2024-02-23 ENCOUNTER — Ambulatory Visit (INDEPENDENT_AMBULATORY_CARE_PROVIDER_SITE_OTHER): Payer: 59 | Admitting: Medical

## 2024-02-23 VITALS — BP 124/80 | HR 80 | Ht 68.5 in | Wt 185.2 lb

## 2024-02-23 DIAGNOSIS — I10 Essential (primary) hypertension: Secondary | ICD-10-CM | POA: Diagnosis not present

## 2024-02-23 DIAGNOSIS — E559 Vitamin D deficiency, unspecified: Secondary | ICD-10-CM

## 2024-02-23 DIAGNOSIS — Z13220 Encounter for screening for lipoid disorders: Secondary | ICD-10-CM

## 2024-02-23 DIAGNOSIS — Z8669 Personal history of other diseases of the nervous system and sense organs: Secondary | ICD-10-CM | POA: Diagnosis not present

## 2024-02-23 DIAGNOSIS — Z0000 Encounter for general adult medical examination without abnormal findings: Secondary | ICD-10-CM | POA: Diagnosis not present

## 2024-02-23 LAB — MICROSCOPIC EXAMINATION

## 2024-02-23 MED ORDER — NURTEC 75 MG PO TBDP: 1.0000 | ORAL_TABLET | Freq: Every day | ORAL | Status: AC | PRN

## 2024-02-23 NOTE — Progress Notes (Signed)
 Subjective:   HPI  Christine Gardner is a 48 y.o. female who presents for a complete physical. Chief Complaint  Patient presents with   Annual Exam    Fasting cpe, no concerns, sees Theatre stage manager obgyn    Preventative care: [facility]opt[facility] Dr. Romie Jumper, dentist  Dr. Valentina Shaggy, prior with [facility], Dr. Normand Sloop Dr. Chilton Si, cardiology Dr. Ileene Patrick Dr. Sofie Rower, Urology Viney Acocella, Kermit Balo, PA-C here for primary care   Concerns: Lately bad headache or migraines 2-3 /mo.   Improved from prior but still gets them.  Uses Advil for abortive therapy.  Lately some headaches have been more sharp pain, but still has to go to bed and sleep them off.   Has missed some work due to migraines  She has questions about cancer screenings available.  Exercising regularly, eating clean, healthy.   Reviewed their medical, surgical, family, social, medication, and allergy history and updated chart as appropriate.  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; Flagyl   Constipation    Family history of breast cancer    Family history of pancreatic cancer    Hypertension 2016   IUD (intrauterine device) in place 2019   Mirena   Migraine    Seasonal allergic rhinitis    allergy testing 06/2013 with [place]   Vitamin D deficiency     Past Surgical History:  Procedure Laterality Date   ABDOMINOPLASTY  12/2019   COLONOSCOPY  12/2015   polyp.   [place], Dr. Huntley Estelle   COLPOSCOPY  2013   LGSIL, f/u q15mo as of 6/14   TOE SURGERY     hammer toe 4th, 5th, bilat    Social History   Socioeconomic History   Marital status: Married    Spouse name: Not on file   Number of children: 3   Years of education: Not on file   Highest education level: Not on file  Occupational History   Not on file  Tobacco Use   Smoking status: Never   Smokeless tobacco: Never  Vaping Use   Vaping status: Never Used  Substance and  Sexual Activity   Alcohol use: Yes    Alcohol/week: 4.0 standard drinks of alcohol    Types: 4 Glasses of wine per week    Comment: occassionally   Drug use: No   Sexual activity: Yes    Birth control/protection: I.U.D.    Comment: Toney Reil /vas   Other Topics Concern   Not on file  Social History Narrative   Divorced, remarried 08/2018.  3 adult children, 2 grandchild.  Exercises most days per week.    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   Christian.  As of 02/2024   Social Drivers of Health   Financial Resource Strain: Low Risk  (02/23/2024)   Overall Financial Resource Strain (CARDIA)    Difficulty of Paying Living Expenses: Not very hard  Food Insecurity: No Food Insecurity (02/23/2024)   Hunger Vital Sign    Worried About Running Out of Food in the Last Year: Never true    Ran Out of Food in the Last Year: Never true  Transportation Needs: No Transportation Needs (02/23/2024)   PRAPARE - Administrator, Civil Service (Medical): No    Lack of Transportation (Non-Medical): No  Physical Activity: Sufficiently Active (02/23/2024)   Exercise Vital Sign    Days of Exercise  per Week: 6 days    Minutes of Exercise per Session: 60 min  Stress: No Stress Concern Present (02/23/2024)   [employer] of Occupational Health - Occupational Stress Questionnaire    Feeling of Stress : Only a little  Social Connections: Moderately Isolated (02/23/2024)   Social Connection and Isolation Panel [NHANES]    Frequency of Communication with Friends and Family: Twice a week    Frequency of Social Gatherings with Friends and Family: Once a week    Attends Religious Services: Never    Database administrator or Organizations: No    Attends Engineer, structural: Not on file    Marital Status: Married  Catering manager Violence: Not At Risk (02/23/2024)   Humiliation, Afraid, Rape, and Kick questionnaire    Fear of Current or  Ex-Partner: No    Emotionally Abused: No    Physically Abused: No    Sexually Abused: No    Family History  Problem Relation Age of Onset   Pancreatic cancer Mother 68   Hyperlipidemia Mother    Migraines Mother    Hypertension Mother    Diabetes Mother    Heart disease Father 70       died of MI   Other Father        quesionable drug abuse   Hypertension Sister    Hypertension Sister    Heart disease Sister        CHF due to hyperthyroid   Hyperthyroidism Sister    Hypertension Brother    Hyperlipidemia Maternal Grandmother    Diabetes Maternal Grandmother    Breast cancer Maternal Grandmother 52       breast with mets   Cancer Maternal Aunt 65       breast   Cancer Maternal Aunt 61       breast   Stroke Neg Hx    Colon cancer Neg Hx      Current Outpatient Medications:    amLODipine (NORVASC) 10 MG tablet, TAKE 1 TABLET BY MOUTH EVERY DAY, Disp: 90 tablet, Rfl: 0   diltiazem (CARDIZEM) 60 MG tablet, Take 1 tablet (60 mg total) by mouth every 6 (six) hours as needed., Disp: 30 tablet, Rfl: 5   diphenhydrAMINE (BENADRYL) 25 MG tablet, Take 1 tablet (25 mg total) by mouth every 6 (six) hours as needed for itching., Disp: 30 tablet, Rfl: 0   levonorgestrel (MIRENA) 20 MCG/24HR IUD, 1 each by Intrauterine route once., Disp: , Rfl:    Rimegepant Sulfate (NURTEC) 75 MG TBDP, Take 1 tablet (75 mg total) by mouth daily as needed., Disp: , Rfl:   Allergies  Allergen Reactions   Flagyl [Metronidazole] Anaphylaxis   Vicodin [Hydrocodone-Acetaminophen]     itching   Hydrocodone-Acetaminophen Itching   Metoprolol     INCREASED HR    Oxycodone-Acetaminophen Itching   Percocet [Oxycodone-Acetaminophen] Itching   Tramadol Itching   Review of Systems  Constitutional:  Negative for chills, fever, malaise/fatigue and weight loss.  HENT:  Negative for congestion, ear pain, hearing loss, sore throat and tinnitus.   Eyes:  Negative for blurred vision, pain and redness.   Respiratory:  Negative for cough, hemoptysis and shortness of breath.   Cardiovascular:  Negative for chest pain, palpitations, orthopnea, claudication and leg swelling.  Gastrointestinal:  Negative for abdominal pain, blood in stool, constipation, diarrhea, nausea and vomiting.  Genitourinary:  Negative for dysuria, flank pain, frequency, hematuria and urgency.  Musculoskeletal:  Negative for falls, joint pain and  myalgias.  Skin:  Negative for itching and rash.  Neurological:  Positive for headaches. Negative for dizziness, tingling, speech change and weakness.  Endo/Heme/Allergies:  Negative for polydipsia. Does not bruise/bleed easily.  Psychiatric/Behavioral:  Negative for depression and memory loss. The patient is not nervous/anxious and does not have insomnia.        Objective:   Physical Exam  BP 124/80   Pulse 80   Ht 5' 8.5" (1.74 m)   Wt 185 lb 3.2 oz (84 kg)   BMI 27.75 kg/m   BP Readings from Last 3 Encounters:  02/23/24 124/80  12/09/23 126/84  11/25/23 (!) 144/85   Wt Readings from Last 3 Encounters:  02/23/24 185 lb 3.2 oz (84 kg)  01/03/24 190 lb 6.4 oz (86.4 kg)  12/09/23 184 lb (83.5 kg)    General appearance: alert, no distress, WD/WN, pleasant African-American female   Skin: tattoo right lower flank/abdomen, tattoo upper back, tattoo lower half of back, no worrisome lesions   HENT/oral - limited exam given covid precautions  Neck: supple, no lymphadenopathy, no thyromegaly, no masses, normal ROM, no bruit Heart: RRR, normal S1, S2, no murmurs   Lungs: CTA bilaterally, no wheezes, rhonchi, or rales   Abdomen: +bs, soft, non tender, non distended, no masses, no hepatomegaly, no splenomegaly, no bruits   Back: non tender, normal ROM, no scoliosis   Musculoskeletal: upper extremities non tender, no obvious deformity, normal ROM throughout, lower extremities non tender, no obvious deformity, normal ROM throughout   Extremities: no edema, no cyanosis, no  clubbing   Pulses: 2+ symmetric, upper and lower extremities, normal cap refill   Neurological: alert, oriented x 3, CN2-12 intact, strength normal upper extremities and lower extremities, sensation normal throughout, DTRs 2+ throughout, no cerebellar signs, gait normal   Psychiatric: normal affect, behavior normal, pleasant   Breast/gyn/rectal - deferred to gynecology    Assessment and Plan :    Encounter Diagnoses  Name Primary?   Essential hypertension Yes   History of migraine    Encounter for health maintenance examination in adult    Vitamin D deficiency    Screening for lipid disorders      Today you had a preventative care visit or wellness visit.    Topics today may have included healthy lifestyle, diet, exercise, preventative care, vaccinations, sick and well care, proper use of emergency dept and after hours care, as well as other concerns.     Recommendations: Continue to return yearly for your annual wellness and preventative care visits.  This gives [place] a chance to discuss healthy lifestyle, exercise, vaccinations, review your chart record, and perform screenings where appropriate.  I recommend you see your eye doctor yearly for routine vision care.  I recommend you see your dentist yearly for routine dental care including hygiene visits twice yearly.   Vaccination recommendations were reviewed Immunization History  Administered Date(s) Administered   Influenza-Unspecified 09/05/2021, 09/19/2022   PFIZER(Purple Top)SARS-COV-2 Vaccination 02/09/2020, 03/01/2020, 10/04/2020   Tdap 05/29/2013, 01/20/2023     Screening for cancer: Breast cancer screening: You should perform a self breast exam monthly.   We reviewed recommendations for regular mammograms and breast cancer screening.  Colon cancer screening:  I reviewed your colonoscopy on file that is up to date from 2017 Discussed screening recommendations FIT test negative 02/22/23  Cervical cancer  screening: We reviewed recommendations for pap smear screening. 2023 pap normal  Skin cancer screening: Check your skin regularly for new changes, growing lesions, or  other lesions of concern Come in for evaluation if you have skin lesions of concern.  Lung cancer screening: If you have a greater than 30 pack year history of tobacco use, then you qualify for lung cancer screening with a chest CT scan  We currently don't have screenings for other cancers besides breast, cervical, colon, and lung cancers.  If you have a strong family history of cancer or have other cancer screening concerns, please let me know.    Bone health: Get at least 150 minutes of aerobic exercise weekly Get weight bearing exercise at least once weekly   Heart health: Get at least 150 minutes of aerobic exercise weekly Limit alcohol It is important to maintain a healthy blood pressure and healthy cholesterol numbers   Separate significant issues discussed: Hypertension-controlled on current medication, Amlodipine 10mg  daily  History of palpitations, on diltiazem prn.    Vitamin D deficiency-uses occasional supplement, not currently on supplement, lab today  Migraines - begin trial of Nurtec for abortive therapy.  Discussed risks/benefit of medication  Zerah was seen today for annual exam.  Diagnoses and all orders for this visit:  Essential hypertension  History of migraine -     Magnesium  Encounter for health maintenance examination in adult -     VITAMIN D 25 Hydroxy (Vit-D Deficiency, Fractures) -     Lipid panel -     Urinalysis, Routine w reflex microscopic -     Magnesium -     Hepatic function panel  Vitamin D deficiency -     VITAMIN D 25 Hydroxy (Vit-D Deficiency, Fractures)  Screening for lipid disorders -     Lipid panel  Other orders -     Rimegepant Sulfate (NURTEC) 75 MG TBDP; Take 1 tablet (75 mg total) by mouth daily as needed.    Follow-up pending labs, yearly for  physical

## 2024-02-24 LAB — URINALYSIS, ROUTINE W REFLEX MICROSCOPIC
Bilirubin, UA: NEGATIVE
Glucose, UA: NEGATIVE
Ketones, UA: NEGATIVE
Nitrite, UA: NEGATIVE
Protein,UA: NEGATIVE
RBC, UA: NEGATIVE
Specific Gravity, UA: 1.023 (ref 1.005–1.030)
Urobilinogen, Ur: 1 mg/dL (ref 0.2–1.0)
pH, UA: 7 (ref 5.0–7.5)

## 2024-02-24 LAB — MICROSCOPIC EXAMINATION

## 2024-02-24 LAB — HEPATIC FUNCTION PANEL
ALT: 18 IU/L (ref 0–32)
AST: 17 IU/L (ref 0–40)
Albumin: 5 g/dL — ABNORMAL HIGH (ref 3.9–4.9)
Alkaline Phosphatase: 123 IU/L — ABNORMAL HIGH (ref 44–121)
Bilirubin Total: 0.6 mg/dL (ref 0.0–1.2)
Bilirubin, Direct: 0.21 mg/dL (ref 0.00–0.40)
Total Protein: 8.2 g/dL (ref 6.0–8.5)

## 2024-02-24 LAB — LIPID PANEL
Chol/HDL Ratio: 3.6 ratio (ref 0.0–4.4)
Cholesterol, Total: 246 mg/dL — ABNORMAL HIGH (ref 100–199)
HDL: 69 mg/dL (ref >39)
LDL Chol Calc (NIH): 169 mg/dL — ABNORMAL HIGH (ref 0–99)
Triglycerides: 50 mg/dL (ref 0–149)
VLDL Cholesterol Cal: 8 mg/dL (ref 5–40)

## 2024-02-24 LAB — VITAMIN D 25 HYDROXY (VIT D DEFICIENCY, FRACTURES): Vit D, 25-Hydroxy: 22.9 ng/mL — ABNORMAL LOW (ref 30.0–100.0)

## 2024-02-24 LAB — MAGNESIUM: Magnesium: 2.3 mg/dL (ref 1.6–2.3)

## 2024-02-24 NOTE — Progress Notes (Signed)
 Results sent through My Chart  Ashley-schedule 3 month fasting follow-up

## 2024-02-29 ENCOUNTER — Encounter (INDEPENDENT_AMBULATORY_CARE_PROVIDER_SITE_OTHER): Payer: Self-pay

## 2024-02-29 ENCOUNTER — Encounter (INDEPENDENT_AMBULATORY_CARE_PROVIDER_SITE_OTHER): Payer: 59 | Admitting: Adult Health

## 2024-03-06 ENCOUNTER — Other Ambulatory Visit: Payer: Self-pay | Admitting: Medical

## 2024-03-06 DIAGNOSIS — R519 Headache, unspecified: Secondary | ICD-10-CM

## 2024-03-06 NOTE — Progress Notes (Signed)
 Brain mi

## 2024-03-07 ENCOUNTER — Encounter: Payer: Self-pay | Admitting: Medical

## 2024-03-08 ENCOUNTER — Encounter (INDEPENDENT_AMBULATORY_CARE_PROVIDER_SITE_OTHER): Payer: Self-pay

## 2024-03-13 ENCOUNTER — Other Ambulatory Visit

## 2024-03-13 ENCOUNTER — Encounter (INDEPENDENT_AMBULATORY_CARE_PROVIDER_SITE_OTHER): Payer: Self-pay

## 2024-03-20 ENCOUNTER — Encounter (INDEPENDENT_AMBULATORY_CARE_PROVIDER_SITE_OTHER): Payer: Self-pay

## 2024-03-28 ENCOUNTER — Encounter: Payer: Self-pay | Admitting: Medical

## 2024-03-29 ENCOUNTER — Ambulatory Visit
Admission: RE | Admit: 2024-03-29 | Discharge: 2024-03-29 | Disposition: A | Discharge status: Home / Self Care | Source: Ambulatory Visit | Attending: Medical | Admitting: Medical

## 2024-03-29 DIAGNOSIS — R519 Headache, unspecified: Secondary | ICD-10-CM | POA: Diagnosis not present

## 2024-03-30 NOTE — Progress Notes (Signed)
 Interestingly the MRI shows evidence of prior small areas of blood flow concern in the left frontal lobe otherwise normal scan  Have her return to discuss this further and other recommendations

## 2024-04-09 ENCOUNTER — Ambulatory Visit: Admitting: Medical

## 2024-04-09 VITALS — BP 138/80 | HR 88 | Wt 189.0 lb

## 2024-04-09 DIAGNOSIS — I10 Essential (primary) hypertension: Secondary | ICD-10-CM | POA: Diagnosis not present

## 2024-04-09 DIAGNOSIS — E7800 Pure hypercholesterolemia, unspecified: Secondary | ICD-10-CM | POA: Diagnosis not present

## 2024-04-09 DIAGNOSIS — R42 Dizziness and giddiness: Secondary | ICD-10-CM | POA: Diagnosis not present

## 2024-04-09 DIAGNOSIS — Z8669 Personal history of other diseases of the nervous system and sense organs: Secondary | ICD-10-CM

## 2024-04-09 NOTE — Progress Notes (Signed)
 Subjective:  Christine Gardner is a 48 y.o. female who presents for Chief Complaint  Patient presents with   Consult    Discuss MRI results      Here to discuss recent MRI brain findings.  I saw her back in January 2025 for dizziness and history of migraines.  She ultimately went to get a head scan to further evaluate.  Here to discuss her recent head scan results.  No current dizziness.  She gets occasional migraine.  Overall eats 80% healthy.  Exercises regularly.  She notes she is entering some menopausal changes.  Lately when she gets migraines it seems more related to her periods are hormonal related.  We have discussed high cholesterol in the past and she has been reluctant to use medication.  She is compliant with her blood pressure medication  No other aggravating or relieving factors.    No other c/o.  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; Flagyl   Constipation    Family history of breast cancer    Family history of pancreatic cancer    Hypertension 2016   IUD (intrauterine device) in place 2019   Mirena    Migraine    Seasonal allergic rhinitis    allergy testing 06/2013 with [place]   Vitamin D  deficiency    Current Outpatient Medications on File Prior to Visit  Medication Sig Dispense Refill   amLODipine  (NORVASC ) 10 MG tablet TAKE 1 TABLET BY MOUTH EVERY DAY 90 tablet 0   diltiazem  (CARDIZEM ) 60 MG tablet Take 1 tablet (60 mg total) by mouth every 6 (six) hours as needed. 30 tablet 5   diphenhydrAMINE  (BENADRYL ) 25 MG tablet Take 1 tablet (25 mg total) by mouth every 6 (six) hours as needed for itching. 30 tablet 0   levonorgestrel  (MIRENA ) 20 MCG/24HR IUD 1 each by Intrauterine route once.     Rimegepant Sulfate (NURTEC) 75 MG TBDP Take 1 tablet (75 mg total) by mouth daily as needed.     No current facility-administered medications on file prior to visit.     The following portions of the patient's history were  reviewed and updated as appropriate: allergies, current medications, past family history, past medical history, past social history, past surgical history and problem list.  ROS Otherwise as in subjective above    Objective: BP 138/80   Pulse 88   Wt 189 lb (85.7 kg)   BMI 28.32 kg/m    BP Readings from Last 3 Encounters:  04/09/24 138/80  02/23/24 124/80  12/09/23 126/84    General appearance: alert, no distress, well developed, well nourished Psych: Pleasant, answers question appropriately Neuro: CN II through XII intact, nonfocal exam   MRI brain without contrast 03/29/2024: IMPRESSION: 1. No evidence of an acute intracranial abnormality. 2. There are a few tiny nonspecific chronic insults within the anterior left frontal lobe white matter. 3. Otherwise unremarkable non-contrast MRI appearance of the brain. 4. 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    Assessment: Encounter Diagnoses  Name Primary?   Lightheaded Yes   Essential hypertension    History of migraine    Elevated LDL cholesterol level      Plan: We discussed her recent MRI of the brain findings I discussed the nonspecific chronic insults within the anterior left frontal lobe.  She notes a head injury in childhood where she hit her head numerous times falling down stairs.  We discussed goals to  keep blood pressure and check,  reduce back cholesterol, and continue healthy lifestyle  Hypertension-continue amlodipine  10 mg daily and diltiazem  60 mg every 6 hours as needed per cardiology  We discussed her elevated LDL.  Discussed diet, medications, exercise.  She declines statin or medications for cholesterol at this time.  Lightheadedness resolved  Migraines are more related to menstrual migraines at this time.  She has Nurtec on hand if needed  Christine Gardner was seen today for consult.  Diagnoses and all orders for this visit:  Lightheaded  Essential hypertension  History of  migraine  Elevated LDL cholesterol level    Follow up: 2-3 months as planned

## 2024-05-14 ENCOUNTER — Encounter (HOSPITAL_BASED_OUTPATIENT_CLINIC_OR_DEPARTMENT_OTHER): Payer: Self-pay | Admitting: Obstetrics & Gynecology

## 2024-05-18 ENCOUNTER — Other Ambulatory Visit (HOSPITAL_BASED_OUTPATIENT_CLINIC_OR_DEPARTMENT_OTHER): Payer: Self-pay | Admitting: Medical

## 2024-05-18 DIAGNOSIS — Z1231 Encounter for screening mammogram for malignant neoplasm of breast: Secondary | ICD-10-CM

## 2024-05-21 ENCOUNTER — Other Ambulatory Visit

## 2024-05-23 ENCOUNTER — Other Ambulatory Visit: Payer: Self-pay | Admitting: Medical

## 2024-05-29 ENCOUNTER — Ambulatory Visit (HOSPITAL_BASED_OUTPATIENT_CLINIC_OR_DEPARTMENT_OTHER)
Admission: RE | Admit: 2024-05-29 | Discharge: 2024-05-29 | Disposition: A | Discharge status: Home / Self Care | Source: Ambulatory Visit | Attending: Medical | Admitting: Medical

## 2024-05-29 ENCOUNTER — Encounter (HOSPITAL_BASED_OUTPATIENT_CLINIC_OR_DEPARTMENT_OTHER): Payer: Self-pay | Admitting: Radiology

## 2024-05-29 DIAGNOSIS — Z1231 Encounter for screening mammogram for malignant neoplasm of breast: Secondary | ICD-10-CM | POA: Insufficient documentation

## 2024-06-01 ENCOUNTER — Ambulatory Visit: Payer: Self-pay | Admitting: Medical

## 2024-06-01 ENCOUNTER — Other Ambulatory Visit: Payer: Self-pay | Admitting: Medical

## 2024-06-01 DIAGNOSIS — R928 Other abnormal and inconclusive findings on diagnostic imaging of breast: Secondary | ICD-10-CM

## 2024-06-01 NOTE — Progress Notes (Signed)
 Further evaluation is suggested for possible asymmetry in the right breast.  Imaging should be calling you to set this up.

## 2024-06-05 ENCOUNTER — Ambulatory Visit
Admission: EM | Admit: 2024-06-05 | Discharge: 2024-06-05 | Disposition: A | Discharge status: Home / Self Care | Source: Home / Self Care | Attending: Family Medicine | Admitting: Family Medicine

## 2024-06-05 ENCOUNTER — Emergency Department (HOSPITAL_BASED_OUTPATIENT_CLINIC_OR_DEPARTMENT_OTHER): Admission: EM | Admit: 2024-06-05 | Discharge: 2024-06-05 | Disposition: A | Discharge status: Home / Self Care

## 2024-06-05 ENCOUNTER — Ambulatory Visit: Payer: Self-pay

## 2024-06-05 ENCOUNTER — Encounter (HOSPITAL_BASED_OUTPATIENT_CLINIC_OR_DEPARTMENT_OTHER): Payer: Self-pay | Admitting: Emergency Medicine

## 2024-06-05 ENCOUNTER — Other Ambulatory Visit: Payer: Self-pay

## 2024-06-05 DIAGNOSIS — K529 Noninfective gastroenteritis and colitis, unspecified: Secondary | ICD-10-CM | POA: Diagnosis not present

## 2024-06-05 DIAGNOSIS — R197 Diarrhea, unspecified: Secondary | ICD-10-CM | POA: Insufficient documentation

## 2024-06-05 DIAGNOSIS — R1013 Epigastric pain: Secondary | ICD-10-CM | POA: Diagnosis not present

## 2024-06-05 DIAGNOSIS — R5383 Other fatigue: Secondary | ICD-10-CM | POA: Diagnosis not present

## 2024-06-05 DIAGNOSIS — M549 Dorsalgia, unspecified: Secondary | ICD-10-CM | POA: Diagnosis not present

## 2024-06-05 DIAGNOSIS — M545 Low back pain, unspecified: Secondary | ICD-10-CM

## 2024-06-05 DIAGNOSIS — R110 Nausea: Secondary | ICD-10-CM | POA: Insufficient documentation

## 2024-06-05 DIAGNOSIS — R350 Frequency of micturition: Secondary | ICD-10-CM | POA: Diagnosis not present

## 2024-06-05 LAB — CBC
HCT: 39.9 % (ref 36.0–46.0)
Hemoglobin: 13.6 g/dL (ref 12.0–15.0)
MCH: 32.3 pg (ref 26.0–34.0)
MCHC: 34.1 g/dL (ref 30.0–36.0)
MCV: 94.8 fL (ref 80.0–100.0)
Platelets: 254 10*3/uL (ref 150–400)
RBC: 4.21 MIL/uL (ref 3.87–5.11)
RDW: 11.8 % (ref 11.5–15.5)
WBC: 6.1 10*3/uL (ref 4.0–10.5)
nRBC: 0 % (ref 0.0–0.2)

## 2024-06-05 LAB — URINALYSIS, ROUTINE W REFLEX MICROSCOPIC
Bilirubin Urine: NEGATIVE
Glucose, UA: NEGATIVE mg/dL
Hgb urine dipstick: NEGATIVE
Ketones, ur: NEGATIVE mg/dL
Leukocytes,Ua: NEGATIVE
Nitrite: NEGATIVE
Protein, ur: NEGATIVE mg/dL
Specific Gravity, Urine: 1.008 (ref 1.005–1.030)
pH: 7.5 (ref 5.0–8.0)

## 2024-06-05 LAB — COMPREHENSIVE METABOLIC PANEL WITH GFR
ALT: 14 U/L (ref 0–44)
AST: 22 U/L (ref 15–41)
Albumin: 4.6 g/dL (ref 3.5–5.0)
Alkaline Phosphatase: 109 U/L (ref 38–126)
Anion gap: 11 (ref 5–15)
BUN: 8 mg/dL (ref 6–20)
CO2: 26 mmol/L (ref 22–32)
Calcium: 9.8 mg/dL (ref 8.9–10.3)
Chloride: 103 mmol/L (ref 98–111)
Creatinine, Ser: 0.74 mg/dL (ref 0.44–1.00)
GFR, Estimated: >60 mL/min (ref >60)
Glucose, Bld: 98 mg/dL (ref 70–99)
Potassium: 3.8 mmol/L (ref 3.5–5.1)
Sodium: 140 mmol/L (ref 135–145)
Total Bilirubin: 0.6 mg/dL (ref 0.0–1.2)
Total Protein: 8.2 g/dL — ABNORMAL HIGH (ref 6.5–8.1)

## 2024-06-05 LAB — POCT URINALYSIS DIP (MANUAL ENTRY)
Bilirubin, UA: NEGATIVE
Blood, UA: NEGATIVE
Glucose, UA: NEGATIVE mg/dL
Ketones, POC UA: NEGATIVE mg/dL
Leukocytes, UA: NEGATIVE
Nitrite, UA: NEGATIVE
Protein Ur, POC: NEGATIVE mg/dL
Spec Grav, UA: 1.01 (ref 1.010–1.025)
Urobilinogen, UA: 0.2 U/dL
pH, UA: 6.5 (ref 5.0–8.0)

## 2024-06-05 LAB — PREGNANCY, URINE: Preg Test, Ur: NEGATIVE

## 2024-06-05 LAB — LIPASE, BLOOD: Lipase: 38 U/L (ref 11–51)

## 2024-06-05 MED ORDER — KETOROLAC TROMETHAMINE 30 MG/ML IJ SOLN
30.0000 mg | Freq: Once | INTRAMUSCULAR | Status: AC
Start: 2024-06-05 — End: 2024-06-05
  Administered 2024-06-05: 30 mg via INTRAMUSCULAR

## 2024-06-05 MED ORDER — LIDOCAINE 5 % EX PTCH
1.0000 | MEDICATED_PATCH | Freq: Once | CUTANEOUS | Status: DC
  Administered 2024-06-05: 1 via TRANSDERMAL
  Filled 2024-06-05: qty 1

## 2024-06-05 MED ORDER — METHOCARBAMOL 500 MG PO TABS
500.0000 mg | ORAL_TABLET | Freq: Once | ORAL | Status: AC
  Administered 2024-06-05: 500 mg via ORAL
  Filled 2024-06-05: qty 1

## 2024-06-05 MED ORDER — LIDOCAINE 5 % EX PTCH
1.0000 | MEDICATED_PATCH | CUTANEOUS | 0 refills | Status: AC
Start: 2024-06-05 — End: ?

## 2024-06-05 MED ORDER — KETOROLAC TROMETHAMINE 10 MG PO TABS: 10.0000 mg | ORAL_TABLET | Freq: Four times a day (QID) | ORAL | 0 refills | Status: AC | PRN

## 2024-06-05 MED ORDER — METHOCARBAMOL 500 MG PO TABS
500.0000 mg | ORAL_TABLET | Freq: Two times a day (BID) | ORAL | 0 refills | Status: AC | PRN
Start: 2024-06-05 — End: ?

## 2024-06-05 MED ORDER — ONDANSETRON 4 MG PO TBDP
4.0000 mg | ORAL_TABLET | Freq: Three times a day (TID) | ORAL | 0 refills | Status: AC | PRN
Start: 2024-06-05 — End: ?

## 2024-06-05 NOTE — ED Triage Notes (Signed)
 Lower back pain for a while now radiating to upper/ mid back, yesterday was worse. Urinary frequency  Epigastric pain started today after UC, shot didn't help  Seen at UC blood work and UrIne done  Low energy fatigue

## 2024-06-05 NOTE — Telephone Encounter (Signed)
 Copied from CRM [id number]. Topic: Clinical - Red Word Triage >> Jun 05, 2024  8:02 AM Willma R wrote: [facility] that prompted transfer to Nurse Triage: Patient states for about a weak off and on has had lower back pain, diarrhea, nausea, low energy, low appetite.

## 2024-06-05 NOTE — ED Provider Notes (Signed)
 [facility]    CSN: [id number] Arrival date & time: 06/05/24  0835      History   Chief Complaint Chief Complaint  Patient presents with   Pain   Diarrhea   Urinary Frequency    HPI Christine Gardner is a 48 y.o. female.    Diarrhea Urinary Frequency  Here for low back pain, malaise, and urinary frequency and nausea.  Her back started bothering her in her sacral area about a week ago.  Then yesterday it changed in character and worsened.  She also started having a little bit in her left upper lumbar region.  It is not really affected by movement.  She has had urinary frequency but no dysuria and no hematuria in these last 2 days.  No fever or chills.  She has had nausea but no vomiting.  She has had 2 loose stools this morning and had some loose stools previously this week.  No upper respiratory symptoms.  She is allergic to Flagyl, hydrocodone and oxycodone  and tramadol.  Flagyl causes anaphylaxis and the pain medication causes itching.  Last menstrual cycle was sometime ago as she has an IUD.  No abdominal pain.  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; Flagyl   Constipation    Family history of breast cancer    Family history of pancreatic cancer    Hypertension 2016   IUD (intrauterine device) in place 2019   Mirena    Migraine    Seasonal allergic rhinitis    allergy testing 06/2013 with [place]   Vitamin D  deficiency     Patient Active Problem List   Diagnosis Date Noted   History of migraine 01/03/2024   Encounter for removal and reinsertion of intrauterine contraceptive device (IUD) 11/25/2023   Plantar fasciitis 01/20/2023   Family history of breast cancer 04/09/2022   History of abnormal cervical Pap smear 04/09/2022   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   Seborrheic dermatitis 04/08/2022   Folliculitis 04/08/2022   Hyperpigmentation 04/08/2022   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    Screening for lipid disorders 01/18/2022   Family history of pancreatic cancer 01/18/2022   Vaccine counseling 01/06/2021   PSVT (paroxysmal supraventricular tachycardia) (HCC) 04/09/2019   Palpitation 09/12/2018   Medication intolerance 08/19/2016   Encounter for health maintenance examination in adult 07/20/2016   Rhinitis, allergic 07/20/2016   Vitamin D  deficiency 07/20/2016   IUD (intrauterine device) in place 07/20/2016   Influenza vaccination declined 07/20/2016   Essential hypertension 06/11/2015   Constipation 06/11/2015    Past Surgical History:  Procedure Laterality Date   ABDOMINOPLASTY  12/2019   COLONOSCOPY  12/2015   polyp.   [place], Dr. Lanier   COLPOSCOPY  2013   LGSIL, f/u q27mo as of 6/14   TOE SURGERY     hammer toe 4th, 5th, bilat    OB History     Gravida  3   Para  3   Term  3   Preterm      AB  0   Living  3      SAB  0   IAB      Ectopic      Multiple      Live Births  3            Home Medications    Prior to Admission medications   Medication Sig Start Date End Date Taking? Authorizing Provider  amLODipine  (NORVASC ) 10 MG tablet  TAKE 1 TABLET BY MOUTH EVERY DAY 05/23/24  Yes Tysinger, Alm RAMAN, PA-C  ketorolac  (TORADOL ) 10 MG tablet Take 1 tablet (10 mg total) by mouth every 6 (six) hours as needed (pain). 06/05/24  Yes Vonna Sharlet POUR, MD  levonorgestrel  (MIRENA ) 20 MCG/24HR IUD 1 each by Intrauterine route once.   Yes [provider]  ondansetron  (ZOFRAN -ODT) 4 MG disintegrating tablet Take 1 tablet (4 mg total) by mouth every 8 (eight) hours as needed for nausea or vomiting. 06/05/24  Yes Vonna Sharlet POUR, MD  diltiazem  (CARDIZEM ) 60 MG tablet Take 1 tablet (60 mg total) by mouth every 6 (six) hours as needed. 12/25/21   Raford Riggs, MD  diphenhydrAMINE  (BENADRYL ) 25 MG tablet Take 1 tablet (25 mg total) by mouth every 6 (six) hours as needed for itching. 05/26/22   Elnor Hila P, DO  Rimegepant  Sulfate (NURTEC) 75 MG TBDP Take 1 tablet (75 mg total) by mouth daily as needed. 02/23/24   Tysinger, Alm RAMAN, PA-C    Family History Family History  Problem Relation Age of Onset   Pancreatic cancer Mother 68   Hyperlipidemia Mother    Migraines Mother    Hypertension Mother    Diabetes Mother    Heart disease Father 70       died of MI   Other Father        quesionable drug abuse   Hypertension Sister    Hypertension Sister    Heart disease Sister        CHF due to hyperthyroid   Hyperthyroidism Sister    Hypertension Brother    Hypertension Brother    Hypertension Brother    Hyperlipidemia Maternal Grandmother    Diabetes Maternal Grandmother    Breast cancer Maternal Grandmother 4       breast with mets   Cancer Maternal Aunt 65       breast   Cancer Maternal Aunt 29       breast   Stroke Neg Hx    Colon cancer Neg Hx     Social History Social History   Tobacco Use   Smoking status: Never   Smokeless tobacco: Never  Vaping Use   Vaping status: Never Used  Substance Use Topics   Alcohol use: Yes    Alcohol/week: 4.0 standard drinks of alcohol    Types: 4 Glasses of wine per week    Comment: occassionally   Drug use: No     Allergies   Flagyl [metronidazole], Vicodin [hydrocodone-acetaminophen], Hydrocodone-acetaminophen, Metoprolol , Oxycodone -acetaminophen, Percocet [oxycodone -acetaminophen], and Tramadol   Review of Systems Review of Systems  Gastrointestinal:  Positive for diarrhea.  Genitourinary:  Positive for frequency.     Physical Exam Triage Vital Signs ED Triage Vitals  Encounter Vitals Group     BP 06/05/24 0854 136/78     Girls Systolic BP Percentile --      Girls Diastolic BP Percentile --      Boys Systolic BP Percentile --      Boys Diastolic BP Percentile --      Pulse Rate 06/05/24 0854 87     Resp 06/05/24 0854 18     Temp 06/05/24 0854 99.1 F (37.3 C)     Temp Source 06/05/24 0854 Oral     SpO2 06/05/24 0854 97 %      Weight 06/05/24 0851 180 lb (81.6 kg)     Height 06/05/24 0851 5' 7 (1.702 m)     Head Circumference --  Peak Flow --      Pain Score 06/05/24 0850 7     Pain Loc --      Pain Education --      Exclude from Growth Chart --    No data found.  Updated Vital Signs BP 136/78 (BP Location: Left Arm)   Pulse 87   Temp 99.1 F (37.3 C) (Oral)   Resp 18   Ht 5' 7 (1.702 m)   Wt 81.6 kg   LMP  (LMP Unknown)   SpO2 97%   BMI 28.19 kg/m   Visual Acuity Right Eye Distance:   Left Eye Distance:   Bilateral Distance:    Right Eye Near:   Left Eye Near:    Bilateral Near:     Physical Exam Vitals reviewed.  Constitutional:      General: She is not in acute distress.    Appearance: She is not ill-appearing, toxic-appearing or diaphoretic.  HENT:     Nose: Nose normal.     Mouth/Throat:     Mouth: Mucous membranes are moist.     Pharynx: No oropharyngeal exudate or posterior oropharyngeal erythema.   Eyes:     Extraocular Movements: Extraocular movements intact.     Conjunctiva/sclera: Conjunctivae normal.     Pupils: Pupils are equal, round, and reactive to light.    Cardiovascular:     Rate and Rhythm: Normal rate and regular rhythm.     Heart sounds: No murmur heard. Pulmonary:     Effort: Pulmonary effort is normal. No respiratory distress.     Breath sounds: No stridor. No wheezing, rhonchi or rales.  Abdominal:     General: There is no distension.     Palpations: There is no mass.     Tenderness: There is no guarding.     Comments: Normal active bowel sounds present.  She is mildly tender in left lower quadrant.   Musculoskeletal:     Cervical back: Neck supple.  Lymphadenopathy:     Cervical: No cervical adenopathy.   Skin:    Capillary Refill: Capillary refill takes less than 2 seconds.     Coloration: Skin is not jaundiced or pale.   Neurological:     General: No focal deficit present.     Mental Status: She is alert and oriented to person,  place, and time.   Psychiatric:        Behavior: Behavior normal.      UC Treatments / Results  Labs (all labs ordered are listed, but only abnormal results are displayed) Labs Reviewed  POCT URINALYSIS DIP (MANUAL ENTRY) - Normal  URINE CULTURE  CBC WITH DIFFERENTIAL/PLATELET  COMPREHENSIVE METABOLIC PANEL WITH GFR  TSH    EKG   Radiology No results found.  Procedures Procedures (including critical care time)  Medications Ordered in UC Medications  ketorolac  (TORADOL ) 30 MG/ML injection 30 mg (30 mg Intramuscular Given 06/05/24 0955)    Initial Impression / Assessment and Plan / UC Course  I have reviewed the triage vital signs and the nursing notes.  Pertinent labs & imaging results that were available during my care of the patient were reviewed by me and considered in my medical decision making (see chart for details).    {The patient has not been seen in Urgent Care in the last 3 years. :1  Urinalysis is negative.  Urine culture is sent to further evaluate her symptoms.  Toradol  was given here for pain and some Toradol  tablets are sent  to the pharmacy along with some Zofran  for her nausea.  I discussed with her that I am not sure if this is a viral illness, though it is not typical onset of symptoms.  I do wonder if she still might have a renal stone causing her symptoms.   CBC, CMP, and TSH are drawn to assess her symptoms of fatigue and nausea and low back pain.  Staff will notify her if there is anything significantly abnormal.  She will go to the emergency room if she worsens anyway, and she will follow-up with her primary care. Final Clinical Impressions(s) / UC Diagnoses   Final diagnoses:  Fatigue, unspecified type  Urinary frequency  Acute bilateral low back pain without sciatica     Discharge Instructions      The urinalysis was clear and did not show any red blood cells or white blood cells or sugar or nitrites.  We have drawn blood to  check your blood counts, kidney and liver function and electrolytes, and thyroid  function.  Our staff will only notify if there is anything significantly abnormal.  Urine culture is sent and if it shows any signs of infection, our staff will notify you about that.  You have been given a shot of Toradol  30 mg today.  Ketorolac  10 mg tablets--take 1 tablet every 6 hours as needed for pain.  This is the same medicine that is in the shot we just gave you  Ondansetron  dissolved in the mouth every 8 hours as needed for nausea or vomiting.    ED Prescriptions     Medication Sig Dispense Auth. Provider   ondansetron  (ZOFRAN -ODT) 4 MG disintegrating tablet Take 1 tablet (4 mg total) by mouth every 8 (eight) hours as needed for nausea or vomiting. 10 tablet Vonna Sharlet POUR, MD   ketorolac  (TORADOL ) 10 MG tablet Take 1 tablet (10 mg total) by mouth every 6 (six) hours as needed (pain). 20 tablet Jakyrah Holladay K, MD      PDMP not reviewed this encounter.   Vonna Sharlet POUR, MD 06/05/24 1010

## 2024-06-05 NOTE — Discharge Instructions (Signed)
 Please follow-up with your primary doctor.  Please try to maintain adequate hydration with frequent sips of clear liquids.  You may take the nausea medications prescribed to you by the urgent care.  You may take over-the-counter medications such as Tylenol starting with ibuprofen  for your back pain.  We are prescribing lidocaine patches and muscle relaxers that she may also use.  Return if develop fevers, chills, chest pain, shortness of breath, palpitations, uncontrolled nausea vomiting, severe pain or any new or worsening symptoms that are concerning to you.

## 2024-06-05 NOTE — ED Triage Notes (Addendum)
 I have had some lower back pain, noticed in last week, but yesterday though something isn't right, a lot of urinary frequency, loose stools x2 this morning. Some nausea no vomiting. No fever.   Note: Recent abnormal mammogram.

## 2024-06-05 NOTE — ED Provider Notes (Signed)
 [place] [facility] AT [facility] Provider Note   CSN: [id number] Arrival date & time: 06/05/24  2056     Patient presents with: Back Pain and Abdominal Pain   Christine Gardner is a 48 y.o. female.   Presenting to the emergency department with several complaints.  Having some back pain for about a wee.  Has moved several places largelyk started after she got a massage right mid back and along iliac crest.   somewhat episodic.  Went to urgent care and reportedly had negative workup then.  Started having some epigastric discomfort today.  Has had some nausea, no vomiting.  Has had couple days of nonbloody diarrhea.  Denies dysuria or frequency.  No weakness in lower extremities.  No saddle anesthesia.   Back Pain Associated symptoms: abdominal pain   Abdominal Pain      Prior to Admission medications   Medication Sig Start Date End Date Taking? Authorizing Provider  amLODipine  (NORVASC ) 10 MG tablet TAKE 1 TABLET BY MOUTH EVERY DAY 05/23/24   Tysinger, Alm RAMAN, PA-C  diltiazem  (CARDIZEM ) 60 MG tablet Take 1 tablet (60 mg total) by mouth every 6 (six) hours as needed. 12/25/21   Raford Riggs, MD  diphenhydrAMINE  (BENADRYL ) 25 MG tablet Take 1 tablet (25 mg total) by mouth every 6 (six) hours as needed for itching. 05/26/22   Elnor Hila P, DO  ketorolac  (TORADOL ) 10 MG tablet Take 1 tablet (10 mg total) by mouth every 6 (six) hours as needed (pain). 06/05/24   Vonna Sharlet POUR, MD  levonorgestrel  (MIRENA ) 20 MCG/24HR IUD 1 each by Intrauterine route once.    [provider]  ondansetron  (ZOFRAN -ODT) 4 MG disintegrating tablet Take 1 tablet (4 mg total) by mouth every 8 (eight) hours as needed for nausea or vomiting. 06/05/24   Vonna Sharlet POUR, MD  Rimegepant Sulfate (NURTEC) 75 MG TBDP Take 1 tablet (75 mg total) by mouth daily as needed. 02/23/24   Tysinger, Alm RAMAN, PA-C    Allergies: Flagyl [metronidazole], Vicodin [hydrocodone-acetaminophen],  Hydrocodone-acetaminophen, Metoprolol , Oxycodone -acetaminophen, Percocet [oxycodone -acetaminophen], and Tramadol    Review of Systems  Gastrointestinal:  Positive for abdominal pain.  Musculoskeletal:  Positive for back pain.    Updated Vital Signs BP 130/87   Pulse 93   Temp 98.9 F (37.2 C) (Oral)   Resp 18   LMP  (LMP Unknown)   SpO2 100%   Physical Exam Vitals and nursing note reviewed.  Constitutional:      General: She is not in acute distress.    Appearance: She is not toxic-appearing.  HENT:     Head: Normocephalic.  Cardiovascular:     Rate and Rhythm: Normal rate and regular rhythm.  Abdominal:     General: Abdomen is flat.     Tenderness: There is no abdominal tenderness.  Musculoskeletal:        General: Normal range of motion.     Comments: No midline spinal tenderness.  5 of 5 plantarflexion, dorsiflexion, hip flexion and extension.  Normal sensation in lower extremities.  Skin:    General: Skin is warm and dry.     Capillary Refill: Capillary refill takes less than 2 seconds.  Neurological:     Mental Status: She is alert and oriented to person, place, and time.  Psychiatric:        Mood and Affect: Mood normal.        Behavior: Behavior normal.     (all labs ordered are listed, but only  abnormal results are displayed) Labs Reviewed  COMPREHENSIVE METABOLIC PANEL WITH GFR - Abnormal; Notable for the following components:      Result Value   Total Protein 8.2 (*)    All other components within normal limits  URINALYSIS, ROUTINE W REFLEX MICROSCOPIC - Abnormal; Notable for the following components:   Color, Urine COLORLESS (*)    All other components within normal limits  LIPASE, BLOOD  CBC  PREGNANCY, URINE    EKG: None  Radiology: No results found.   Procedures   Medications Ordered in the ED  lidocaine (LIDODERM) 5 % 1 patch (1 patch Transdermal Patch Applied 06/05/24 2236)  methocarbamol  (ROBAXIN ) tablet 500 mg (500 mg Oral Given  06/05/24 2236)                                    Medical Decision Making This is a 48 year old female presenting emergency department with what sounds like viral gastroenteritis with some nonspecific abdominal pain with nausea with few days of nonbloody diarrhea.  Also having some back pain that seems to be musculoskeletal in nature.  No red flags on history or physical.  Low suspicion for acute cord compression.  Labs reassuring no fever tachycardia or leukocytosis to suggest systemic infection.  No metabolic derangements.  She has normal kidney function.  No transaminitis to suggest hepatobiliary disease.  Lipase is normal.  Pancreatitis unlikely.  Pregnancy test negative.  Ectopic pregnancy unlikely.  Urine without evidence of urinary tract infection.  Not having hematuria to suggest kidney stone.  Discussed supportive care and follow-up PCP.  Stable for discharge.  Amount and/or Complexity of Data Reviewed Labs: ordered. Radiology:     Details: Considered CT scan, however reassuring vitals, physical exam and normal labs with what sounds like viral gastroenteritis.  Labs unlikely to reveal acute surgical pathology.  Risk Prescription drug management. Decision regarding hospitalization.      Final diagnoses:  None    ED Discharge Orders     None          Neysa Caron PARAS, DO 06/05/24 2245

## 2024-06-05 NOTE — Telephone Encounter (Signed)
 FYI Only or Action Required?: FYI only for provider.  Patient was last seen in primary care on 04/09/2024 by Bulah Alm RAMAN, PA-C. Called Nurse Triage reporting Back Pain. Symptoms began a week ago. Interventions attempted: OTC medications: Advil  and Rest, hydration, or home remedies. Symptoms are: gradually worsening.  Triage Disposition: See Physician Within 24 Hours  Patient/caregiver understands and will follow disposition?: Yes   Reason for Disposition  Blood in urine (red, pink, or tea-colored)    Described acute urinary symptoms including frequency and concentrated odor. Accompanied by fatigue, malaise, and weakness.  Answer Assessment - Initial Assessment Questions 1. ONSET: When did the pain begin?      About a week  2. LOCATION: Where does it hurt? (upper, mid or lower back)     Lower back  3. SEVERITY: How bad is the pain?  (e.g., Scale 1-10; mild, moderate, or severe)   - MILD (1-3): Doesn't interfere with normal activities.    - MODERATE (4-7): Interferes with normal activities or awakens from sleep.    - SEVERE (8-10): Excruciating pain, unable to do any normal activities.      Moderate  4. PATTERN: Is the pain constant? (e.g., yes, no; constant, intermittent)       Constant  5. RADIATION: Does the pain shoot into your legs or somewhere else?     No  6. CAUSE:  What do you think is causing the back pain?      Unsure  7. BACK OVERUSE:  Any recent lifting of heavy objects, strenuous work or exercise?     No  8. MEDICINES: What have you taken so far for the pain? (e.g., nothing, acetaminophen, NSAIDS)     Advil , Some relief  9. NEUROLOGIC SYMPTOMS: Do you have any weakness, numbness, or problems with bowel/bladder control?     No  10. OTHER SYMPTOMS: Do you have any other symptoms? (e.g., fever, abdomen pain, burning with urination, blood in urine)       Diarrhea (2 occurrences average), Fatigue, malaise, Nausea, Weakness, Urinary  Frequency, Concentrated Odor (Urine)  11. PREGNANCY: Is there any chance you are pregnant? When was your last menstrual period?       No, IUD  Recommended  UC. No availability in office. Patient agreed to disposition.  Protocols used: Back Pain-A-AH

## 2024-06-05 NOTE — Discharge Instructions (Signed)
 The urinalysis was clear and did not show any red blood cells or white blood cells or sugar or nitrites.  We have drawn blood to check your blood counts, kidney and liver function and electrolytes, and thyroid  function.  Our staff will only notify if there is anything significantly abnormal.  Urine culture is sent and if it shows any signs of infection, our staff will notify you about that.  You have been given a shot of Toradol  30 mg today.  Ketorolac  10 mg tablets--take 1 tablet every 6 hours as needed for pain.  This is the same medicine that is in the shot we just gave you  Ondansetron  dissolved in the mouth every 8 hours as needed for nausea or vomiting.

## 2024-06-05 NOTE — Telephone Encounter (Signed)
 Spoke to pt, she just left Urgent Care. She states they could not find anything and she feels something it going on. They did give her an injection for the pain and did labs. She scheduled for [facility] tomorrow tentatively and will call to cancel if she is improving

## 2024-06-06 ENCOUNTER — Ambulatory Visit (HOSPITAL_COMMUNITY): Payer: Self-pay

## 2024-06-06 ENCOUNTER — Ambulatory Visit: Admitting: Medical

## 2024-06-06 LAB — CBC WITH DIFFERENTIAL/PLATELET
Basophils Absolute: 0 10*3/uL (ref 0.0–0.2)
Basos: 0 %
EOS (ABSOLUTE): 0.1 10*3/uL (ref 0.0–0.4)
Eos: 1 %
Hematocrit: 43.5 % (ref 34.0–46.6)
Hemoglobin: 14 g/dL (ref 11.1–15.9)
Immature Grans (Abs): 0 10*3/uL (ref 0.0–0.1)
Immature Granulocytes: 0 %
Lymphocytes Absolute: 1.1 10*3/uL (ref 0.7–3.1)
Lymphs: 21 %
MCH: 33 pg (ref 26.6–33.0)
MCHC: 32.2 g/dL (ref 31.5–35.7)
MCV: 103 fL — ABNORMAL HIGH (ref 79–97)
Monocytes Absolute: 0.2 10*3/uL (ref 0.1–0.9)
Monocytes: 5 %
Neutrophils Absolute: 3.8 10*3/uL (ref 1.4–7.0)
Neutrophils: 73 %
Platelets: 253 10*3/uL (ref 150–450)
RBC: 4.24 x10E6/uL (ref 3.77–5.28)
RDW: 12.1 % (ref 11.7–15.4)
WBC: 5.2 10*3/uL (ref 3.4–10.8)

## 2024-06-06 LAB — COMPREHENSIVE METABOLIC PANEL WITH GFR
ALT: 13 IU/L (ref 0–32)
AST: 19 IU/L (ref 0–40)
Albumin: 4.9 g/dL (ref 3.9–4.9)
Alkaline Phosphatase: 120 IU/L (ref 44–121)
BUN/Creatinine Ratio: 13 (ref 9–23)
BUN: 10 mg/dL (ref 6–24)
Bilirubin Total: 0.5 mg/dL (ref 0.0–1.2)
CO2: 18 mmol/L — ABNORMAL LOW (ref 20–29)
Calcium: 10.1 mg/dL (ref 8.7–10.2)
Chloride: 104 mmol/L (ref 96–106)
Creatinine, Ser: 0.77 mg/dL (ref 0.57–1.00)
Globulin, Total: 3.3 g/dL (ref 1.5–4.5)
Glucose: 101 mg/dL — ABNORMAL HIGH (ref 70–99)
Potassium: 3.9 mmol/L (ref 3.5–5.2)
Sodium: 141 mmol/L (ref 134–144)
Total Protein: 8.2 g/dL (ref 6.0–8.5)
eGFR: 95 mL/min/{1.73_m2} (ref >59)

## 2024-06-06 LAB — URINE CULTURE: Culture: NO GROWTH

## 2024-06-06 LAB — TSH: TSH: 1.37 u[IU]/mL (ref 0.450–4.500)

## 2024-06-12 ENCOUNTER — Ambulatory Visit: Admission: RE | Admit: 2024-06-12 | Source: Ambulatory Visit

## 2024-06-12 ENCOUNTER — Ambulatory Visit
Admission: RE | Admit: 2024-06-12 | Discharge: 2024-06-12 | Disposition: A | Discharge status: Home / Self Care | Source: Ambulatory Visit | Attending: Medical | Admitting: Medical

## 2024-06-12 DIAGNOSIS — R928 Other abnormal and inconclusive findings on diagnostic imaging of breast: Secondary | ICD-10-CM

## 2024-06-13 ENCOUNTER — Ambulatory Visit: Admitting: Medical

## 2024-06-13 ENCOUNTER — Ambulatory Visit: Payer: Self-pay | Admitting: Medical

## 2024-06-13 VITALS — BP 124/80 | HR 84 | Temp 98.2°F | Wt 182.6 lb

## 2024-06-13 DIAGNOSIS — M545 Low back pain, unspecified: Secondary | ICD-10-CM

## 2024-06-13 DIAGNOSIS — R109 Unspecified abdominal pain: Secondary | ICD-10-CM | POA: Diagnosis not present

## 2024-06-13 DIAGNOSIS — N281 Cyst of kidney, acquired: Secondary | ICD-10-CM

## 2024-06-13 DIAGNOSIS — K7689 Other specified diseases of liver: Secondary | ICD-10-CM

## 2024-06-13 LAB — POCT URINALYSIS DIP (PROADVANTAGE DEVICE)
Bilirubin, UA: NEGATIVE
Blood, UA: NEGATIVE
Glucose, UA: NEGATIVE mg/dL
Ketones, POC UA: NEGATIVE mg/dL
Nitrite, UA: NEGATIVE
Protein Ur, POC: NEGATIVE mg/dL
Specific Gravity, Urine: 1.005
Urobilinogen, Ur: NEGATIVE
pH, UA: 7.5 (ref 5.0–8.0)

## 2024-06-13 NOTE — Progress Notes (Signed)
 Subjective:  Christine Gardner is a 48 y.o. female who presents for Chief Complaint  Patient presents with   Hospitalization Follow-up    Went to UC as she thought had UTI, lower back pain and stomach pain. Went to ER because the pain got worse. However she was given medication at ER and sent home. Pt is still having severe pain in back, pain level 7/10, took Ibgard for stomach that has helped some. Pain wakes her up at night     Here for follow-up from recent visit to urgent care and emergency department.  She for started feeling symptoms on Monday 06/04/24 with low back pain. Had some discomfort urinating the week before.   Ended up going to urgent care on Tuesday.  Had labs, urine test, had shot of Toradol .    Later that evening she started having severe pain in epigastric area later that week along with back pain and side pains.   Went to emergency dept that evening, labs, other test.  Was sent home with lidocaine  patch and muscle relaxer.  Stomach pain resolved within a day or 2 with Ibguard.     However, the back pain is still there.  Ended up using 1/4 dose of muscle relaxer.  Has used some Advil .  Current pain continues in low back with some radiation to both sides, consistent.  No leg pains  No recent injury or trauma or fall . Hasn't been exercising in 3 weeks.  Has hx/o renal cyst, worried about the cyst.   Has seen urology, Dr. Selma.   Her mattress is relative new.  Has IUD that was replaced this past year.    No pelvic pain, no pain with intercourse.   No other aggravating or relieving factors.    No other c/o.  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 30 tablet 5   diphenhydrAMINE  (BENADRYL ) 25 MG tablet Take 1 tablet (25 mg total) by mouth every 6 (six) hours as needed for itching. 30 tablet 0   Rimegepant Sulfate (NURTEC) 75 MG TBDP Take 1 tablet (75 mg total) by mouth daily as needed.     ketorolac  (TORADOL ) 10 MG tablet Take 1 tablet (10 mg total) by mouth every 6 (six) hours as needed (pain). (Patient not taking: Reported on 06/13/2024) 20 tablet 0   levonorgestrel  (MIRENA ) 20 MCG/24HR IUD 1 each by Intrauterine route once.     lidocaine  (LIDODERM ) 5 % Place 1 patch onto the skin daily. Remove & Discard patch within 12 hours or as directed by MD 14 patch 0   methocarbamol  (ROBAXIN ) 500 MG tablet Take 1 tablet (500 mg total) by mouth 2 (two) times daily as needed for muscle spasms. (Patient not taking: Reported on 06/13/2024) 14 tablet 0   ondansetron  (ZOFRAN -ODT) 4 MG disintegrating  tablet Take 1 tablet (4 mg total) by mouth every 8 (eight) hours as needed for nausea or vomiting. (Patient not taking: Reported on 06/13/2024) 10 tablet 0   No current facility-administered medications on file prior to visit.     The following portions of the patient's history were reviewed and updated as appropriate: allergies, current medications, past family history, past medical history, past social history, past surgical history and problem list.  ROS Otherwise as in subjective above    Objective: BP 124/80   Pulse 84   Temp 98.2 F (36.8 C)   Wt 182 lb 9.6 oz (82.8 kg)   LMP  (LMP Unknown)   BMI 28.60 kg/m   General appearance: alert, no distress, well developed, well nourished Abdomen: +bs, soft, non tender, non distended, no masses, no hepatomegaly, no splenomegaly Back:  no tendnerss, normal ROM, no deformity Legs nontender, normal ROM, no deformity Pulses: 2+ radial pulses, 2+ pedal pulses, normal cap refill Ext: no edema     Assessment: Encounter Diagnoses  Name Primary?   Low back pain without sciatica, unspecified back pain laterality, unspecified chronicity Yes   Abdominal pain, unspecified abdominal location    Renal cyst    Liver cyst      Plan: I reviewed over her recent urgent care and ED visit notes, labs and findings.   Urinalysis repeat today unremarkable  I reviewed labs from 06/05/24.  Given prior abnormal scan and liver and renal cysts on scan in 2023, and given current unexplained pain, we will get her set up for CT abdomen pelvis.  Advised stretching, low impact exercise. Can use OTC analgesic prn.   Rania was seen today for hospitalization follow-up.  Diagnoses and all orders for this visit:  Low back pain without sciatica, unspecified back pain laterality, unspecified chronicity -     CT ABDOMEN PELVIS W CONTRAST; Future -     POCT Urinalysis DIP (Proadvantage Device)  Abdominal pain, unspecified abdominal location -     CT ABDOMEN PELVIS W CONTRAST; Future  Renal cyst -     CT ABDOMEN PELVIS W CONTRAST; Future  Liver cyst -     CT ABDOMEN PELVIS W CONTRAST; Future    Follow up: pending CT

## 2024-06-13 NOTE — Progress Notes (Signed)
 Results sent through MyChart

## 2024-06-19 ENCOUNTER — Ambulatory Visit
Admission: RE | Admit: 2024-06-19 | Discharge: 2024-06-19 | Disposition: A | Discharge status: Home / Self Care | Source: Ambulatory Visit | Attending: Medical | Admitting: Medical

## 2024-06-19 DIAGNOSIS — N281 Cyst of kidney, acquired: Secondary | ICD-10-CM | POA: Diagnosis not present

## 2024-06-19 DIAGNOSIS — R109 Unspecified abdominal pain: Secondary | ICD-10-CM

## 2024-06-19 DIAGNOSIS — Z975 Presence of (intrauterine) contraceptive device: Secondary | ICD-10-CM | POA: Diagnosis not present

## 2024-06-19 DIAGNOSIS — M545 Low back pain, unspecified: Secondary | ICD-10-CM

## 2024-06-19 DIAGNOSIS — K7689 Other specified diseases of liver: Secondary | ICD-10-CM

## 2024-06-19 MED ORDER — IOPAMIDOL (ISOVUE-300) INJECTION 61%
100.0000 mL | Freq: Once | INTRAVENOUS | Status: AC | PRN
  Administered 2024-06-19: 100 mL via INTRAVENOUS

## 2024-06-20 ENCOUNTER — Other Ambulatory Visit

## 2024-06-20 DIAGNOSIS — E7800 Pure hypercholesterolemia, unspecified: Secondary | ICD-10-CM

## 2024-06-20 DIAGNOSIS — E559 Vitamin D deficiency, unspecified: Secondary | ICD-10-CM

## 2024-06-21 ENCOUNTER — Other Ambulatory Visit

## 2024-06-21 LAB — LIPID PANEL

## 2024-06-24 ENCOUNTER — Ambulatory Visit: Payer: Self-pay | Admitting: Medical

## 2024-06-24 NOTE — Progress Notes (Signed)
Results sent through mychart 

## 2024-06-26 ENCOUNTER — Ambulatory Visit: Payer: Self-pay | Admitting: Medical

## 2024-06-26 LAB — LIPID PANEL
Chol/HDL Ratio: 3.4 ratio (ref 0.0–4.4)
Cholesterol, Total: 204 mg/dL — ABNORMAL HIGH (ref 100–199)
HDL: 60 mg/dL (ref >39)
LDL Chol Calc (NIH): 135 mg/dL — ABNORMAL HIGH (ref 0–99)
Triglycerides: 50 mg/dL (ref 0–149)
VLDL Cholesterol Cal: 9 mg/dL (ref 5–40)

## 2024-06-26 LAB — VITAMIN D 25 HYDROXY (VIT D DEFICIENCY, FRACTURES): Vit D, 25-Hydroxy: 55.6 ng/mL (ref 30.0–100.0)

## 2024-06-26 NOTE — Progress Notes (Signed)
Results sent to MyChart

## 2024-09-24 DIAGNOSIS — L309 Dermatitis, unspecified: Secondary | ICD-10-CM | POA: Diagnosis not present

## 2024-09-24 DIAGNOSIS — L2089 Other atopic dermatitis: Secondary | ICD-10-CM | POA: Diagnosis not present

## 2024-11-20 ENCOUNTER — Other Ambulatory Visit: Payer: Self-pay | Admitting: Medical

## 2024-12-31 ENCOUNTER — Encounter: Payer: Self-pay | Admitting: *Deleted

## 2025-01-09 NOTE — Telephone Encounter (Signed)
 Scheduled for 04/11/25.

## 2025-03-07 ENCOUNTER — Encounter: Payer: Self-pay | Admitting: Medical

## 2025-04-11 ENCOUNTER — Encounter: Admitting: Medical
